# Patient Record
Sex: Female | Born: 1965 | Race: White | Hispanic: No | Marital: Single | Smoking: Former smoker
Health system: Southern US, Community
[De-identification: ages and names within clinical notes are randomized; demographics above are authoritative.]

## PROBLEM LIST (undated history)

## (undated) DIAGNOSIS — I2510 Atherosclerotic heart disease of native coronary artery without angina pectoris: Secondary | ICD-10-CM

## (undated) DIAGNOSIS — I10 Essential (primary) hypertension: Secondary | ICD-10-CM

## (undated) DIAGNOSIS — M329 Systemic lupus erythematosus, unspecified: Secondary | ICD-10-CM

## (undated) DIAGNOSIS — E119 Type 2 diabetes mellitus without complications: Secondary | ICD-10-CM

## (undated) HISTORY — PX: VALVE REPLACEMENT: SUR13

## (undated) HISTORY — PX: APPENDECTOMY: SHX54

## (undated) HISTORY — PX: KNEE ARTHROSCOPY: SUR90

## (undated) HISTORY — PX: TONSILLECTOMY: SUR1361

---

## 2004-05-27 ENCOUNTER — Emergency Department: Payer: Self-pay | Admitting: Internal Medicine

## 2005-06-22 ENCOUNTER — Ambulatory Visit: Payer: Self-pay | Admitting: Obstetrics and Gynecology

## 2005-07-14 ENCOUNTER — Ambulatory Visit: Payer: Self-pay | Admitting: Pain Medicine

## 2005-07-20 ENCOUNTER — Ambulatory Visit: Payer: Self-pay | Admitting: Pain Medicine

## 2005-09-20 ENCOUNTER — Ambulatory Visit: Payer: Self-pay | Admitting: Pain Medicine

## 2005-09-26 ENCOUNTER — Ambulatory Visit: Payer: Self-pay | Admitting: Pain Medicine

## 2005-09-27 ENCOUNTER — Emergency Department: Payer: Self-pay | Admitting: Internal Medicine

## 2005-12-21 ENCOUNTER — Inpatient Hospital Stay: Payer: Self-pay | Admitting: Internal Medicine

## 2005-12-29 ENCOUNTER — Emergency Department: Payer: Self-pay | Admitting: Emergency Medicine

## 2005-12-29 ENCOUNTER — Other Ambulatory Visit: Payer: Self-pay

## 2006-03-20 ENCOUNTER — Ambulatory Visit: Payer: Self-pay | Admitting: Unknown Physician Specialty

## 2006-04-28 ENCOUNTER — Ambulatory Visit: Payer: Self-pay | Admitting: Unknown Physician Specialty

## 2006-12-24 ENCOUNTER — Emergency Department: Payer: Self-pay | Admitting: Emergency Medicine

## 2007-04-01 ENCOUNTER — Other Ambulatory Visit: Payer: Self-pay

## 2007-04-01 ENCOUNTER — Emergency Department: Payer: Self-pay | Admitting: Emergency Medicine

## 2007-04-30 ENCOUNTER — Emergency Department: Payer: Self-pay | Admitting: Emergency Medicine

## 2007-04-30 ENCOUNTER — Other Ambulatory Visit: Payer: Self-pay

## 2008-05-26 ENCOUNTER — Emergency Department: Payer: Self-pay | Admitting: Emergency Medicine

## 2008-08-22 ENCOUNTER — Emergency Department: Payer: Self-pay | Admitting: Emergency Medicine

## 2011-11-02 DIAGNOSIS — G8929 Other chronic pain: Secondary | ICD-10-CM | POA: Insufficient documentation

## 2011-11-02 DIAGNOSIS — I2510 Atherosclerotic heart disease of native coronary artery without angina pectoris: Secondary | ICD-10-CM | POA: Insufficient documentation

## 2012-03-08 ENCOUNTER — Emergency Department: Payer: Self-pay | Admitting: Emergency Medicine

## 2012-03-08 LAB — CBC
HCT: 34.5 % — ABNORMAL LOW (ref 35.0–47.0)
HGB: 10.6 g/dL — ABNORMAL LOW (ref 12.0–16.0)
MCH: 21.8 pg — ABNORMAL LOW (ref 26.0–34.0)
MCHC: 30.8 g/dL — ABNORMAL LOW (ref 32.0–36.0)
MCV: 71 fL — ABNORMAL LOW (ref 80–100)
Platelet: 275 10*3/uL (ref 150–440)
RBC: 4.88 10*6/uL (ref 3.80–5.20)
RDW: 17 % — ABNORMAL HIGH (ref 11.5–14.5)
WBC: 13 10*3/uL — ABNORMAL HIGH (ref 3.6–11.0)

## 2012-03-08 LAB — COMPREHENSIVE METABOLIC PANEL
Albumin: 3.4 g/dL (ref 3.4–5.0)
Alkaline Phosphatase: 91 U/L (ref 50–136)
BUN: 11 mg/dL (ref 7–18)
Bilirubin,Total: 0.3 mg/dL (ref 0.2–1.0)
Chloride: 110 mmol/L — ABNORMAL HIGH (ref 98–107)
Co2: 26 mmol/L (ref 21–32)
SGOT(AST): 21 U/L (ref 15–37)
SGPT (ALT): 23 U/L (ref 12–78)
Total Protein: 7.3 g/dL (ref 6.4–8.2)

## 2012-08-22 ENCOUNTER — Emergency Department: Payer: Self-pay

## 2012-08-22 LAB — BASIC METABOLIC PANEL
Anion Gap: 7 (ref 7–16)
Chloride: 106 mmol/L (ref 98–107)
Co2: 24 mmol/L (ref 21–32)
Creatinine: 0.73 mg/dL (ref 0.60–1.30)
EGFR (African American): >60
EGFR (Non-African Amer.): >60
Osmolality: 277 (ref 275–301)
Potassium: 4.3 mmol/L (ref 3.5–5.1)
Sodium: 137 mmol/L (ref 136–145)

## 2012-08-22 LAB — CBC
MCH: 22.7 pg — ABNORMAL LOW (ref 26.0–34.0)
MCV: 72 fL — ABNORMAL LOW (ref 80–100)
Platelet: 272 10*3/uL (ref 150–440)
RDW: 22.1 % — ABNORMAL HIGH (ref 11.5–14.5)
WBC: 13.3 10*3/uL — ABNORMAL HIGH (ref 3.6–11.0)

## 2012-08-22 LAB — TROPONIN I
Troponin-I: <0.02 ng/mL
Troponin-I: <0.02 ng/mL

## 2012-08-23 LAB — HCG, QUANTITATIVE, PREGNANCY: Beta Hcg, Quant.: <1 m[IU]/mL — ABNORMAL LOW

## 2012-10-06 ENCOUNTER — Emergency Department: Payer: Self-pay | Admitting: Emergency Medicine

## 2014-01-22 ENCOUNTER — Emergency Department: Payer: Self-pay | Admitting: Emergency Medicine

## 2014-01-28 ENCOUNTER — Emergency Department: Payer: Self-pay | Admitting: Emergency Medicine

## 2014-01-31 ENCOUNTER — Emergency Department: Payer: Self-pay | Admitting: Emergency Medicine

## 2014-06-25 ENCOUNTER — Emergency Department
Admit: 2014-06-25 | Disposition: A | Discharge status: Home / Self Care | Payer: Self-pay | Admitting: Emergency Medicine

## 2014-07-18 NOTE — H&P (Signed)
PATIENT NAME:  [name], [name] MR#:  [id number] DATE OF BIRTH:  01-09-1966  DATE OF ADMISSION:  08/22/2012  PRIMARY CARE PHYSICIAN:  Dr. Cay SchillingsJack Wolf.   REFERRING PHYSICIAN:  Dr. Maurilio LovelyNoelle McLaurin.   CHIEF COMPLAINT:  Chest pain since yesterday.   HISTORY OF PRESENT ILLNESS:  [name] is a 49 year old Caucasian female with history of systemic hypertension, diabetes mellitus and obesity.  She reports the development of chest pain, started 24 hours ago, located across the chest from the right to the left, radiating to the neck, the back of the shoulder and down to the left arm.  The pain described as tightness and bad ache-like feeling.  The severity was 7 on a scale of 10.  This had persisted, then she decided today to come to the Emergency Department.  There is no significant shortness of breath.  No vomiting.  She indicates that two months ago she had similar pain and her primary care physician referred her to see a [facility].  She did not have a stress test, but she ended by having addition of nitrite or isosorbide.  She was fine until yesterday when the pain recurred.  She also indicated that she had cardiac work[place] in [place] two years ago before she moved to this area.  At that time, she had stress test, then she ended by having cardiac catheterization which showed single-vessel disease, but the severity of the disease was mild and did not require intervention and this is according to the patient.   REVIEW OF SYSTEMS:  CONSTITUTIONAL:  Denies any fever.  No chills.  No fatigue.  EYES:  No blurring of vision.  No double vision.  EARS, NOSE, THROAT:  No hearing impairment.  No sore throat.  No dysphagia.  CARDIOVASCULAR:  Reports the chest pain as above.  No significant shortness of breath.  No edema.  No syncope.  No palpitations.  RESPIRATORY:  No cough.  No sputum production.  No hemoptysis.  GASTROINTESTINAL:  No abdominal pain.  No vomiting.  No diarrhea.   GENITOURINARY:  No dysuria.  No frequency of urination.  Her last menstrual period was two months ago, usually it is very irregular, sometimes takes six months or a year intervals.  MUSCULOSKELETAL:  No joint pain or swelling.  No muscular pain or swelling.  INTEGUMENTARY:  No skin rash.  No ulcers.  NEUROLOGY:  No focal weakness.  No seizure activity.  No headache.  PSYCHIATRY:  She has some anxiety.  No depression.  ENDOCRINE:  No polyuria or polydipsia.  No heat or cold intolerance.   PAST MEDICAL HISTORY:  History of chest pain evaluated two years ago with cardiac cath showing single vessel disease with minimal findings that did not require intervention.  Also the patient reports two months ago she saw a cardiologist, but no significant cardiac work-up.  Her other medical problems include systemic hypertension, diabetes mellitus type 2, chronic smoking, obesity, anxiety and depression.   PAST SURGICAL HISTORY:  Cholecystectomy, appendectomy, C-section, tonsillectomy, ovarian surgery for a cyst and knee surgery.   FAMILY HISTORY:  Her father died from complications of end-stage renal disease and he was on dialysis.  He had coronary artery disease and diabetes.  Her mother is still living.  She suffers from hypercholesterolemia or hyperlipidemia.   SOCIAL HABITS:  Chronic smoker.  She started smoking at age 49.  She is trying to cut down.  Now, she is smoking five cigarettes a day.  No history of  alcohol or drug abuse.   SOCIAL HISTORY:  She is single.  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   ADMISSION MEDICATIONS:  Metoprolol 100 mg twice a day, metformin 1000 mg twice a day, lisinopril 10 mg a day, isosorbide dinitrate 30 mg q. 6 hours, Nexium 40 mg twice a day, Prozac 40 mg once a day, Xanax 0.5 mg 3 times a day.   ALLERGIES:  MORPHINE CAUSING CHEST TIGHTNESS.  TYLOX  CAUSING NAUSEA AND VOMITING.  DEMEROL CAUSING HEADACHES.  VOLTAREN CAUSING SKIN RASH.  SKELAXIN, ULTRAM AND CATAFLAM CAUSING SKIN RASH, SO ALSO WITH PERCOCET CAUSING ITCHING AND CELEBREX AS WELL.   PHYSICAL EXAMINATION: VITAL SIGNS:  Blood pressure 160/85, respiratory rate 20, pulse 73, temperature 98, oxygen saturation 98%.  GENERAL APPEARANCE:  Young female, morbidly obese, sitting on the stretcher in no acute distress.  HEAD AND NECK:  No pallor.  No icterus.  No cyanosis.  Ear examination revealed normal hearing, no discharge, no ulcers.  Examination of the nose showed no discharge, no bleeding, no ulcers.  Oropharyngeal examination revealed no exudate.  No oral thrush.  She has mild tenderness over the left maxillary area.  This is chronic for her for a while and she thinks she may have a tooth abscess and she is looking for a dentist.  Eye examination revealed normal eyelids and conjunctivae.  Pupils about 4 to 5 mm, round, equal and sluggishly reactive to light.  NECK:  Supple.  Trachea at midline.  No thyromegaly.  No cervical lymphadenopathy.  No masses.  HEART:  Normal S1, S2.  No S3 or S4.  There is a grade 2 over 6 mid systolic murmur at the aortic area and left sternal border radiating to the neck.  The patient indicates that she has a history of murmur before.  RESPIRATORY:  Revealed normal breathing pattern without use of accessory muscles.  No rales.  No wheezing.  ABDOMEN:  Soft without tenderness.  No hepatosplenomegaly.  No masses.  No hernias.  SKIN:  Revealed no ulcers.  No subcutaneous nodules.  MUSCULOSKELETAL:  No joint swelling.  No clubbing.  NEUROLOGIC:  Cranial nerves II through XII are intact.  No focal motor deficit.  PSYCHIATRIC:  The patient is alert, oriented x 3.  Mood and affect were normal.   LABORATORY FINDINGS:  Her EKG showed normal sinus rhythm at rate of 97 per minute.  Nonspecific ST-T wave abnormalities in the lateral leads, more so consistent with left  ventricular hypertrophy with repolarization abnormality.  There is subtle change compared to her EKG in December of 2013.  At that time her T waves were upright in V5, V6, however this could be secondary to lead misplacement as similar changes of ST-T wave changes were present at that time on leads 1 and aVL.  Chest x-ray showed no acute cardiopulmonary abnormalities.  Blood work-up showed glucose of 158, BUN 12, creatinine 0.7, sodium 137, potassium 4.3.  Troponin less than 0.02 in two samples.  CBC showed a white count of 13,000, hemoglobin 13, hematocrit 41, platelet count 272.   ASSESSMENT: 1.  Chest pain.  The description of her chest pain is suspicious for angina.  2.  Systemic hypertension.  3.  Diabetes mellitus, type 2.  4.  Chronic smoker. 5.  Obesity.   PLAN:  We will admit the patient  to the medical floor on telemetry monitoring as observation.  Follow up on cardiac enzymes.  Obtain nuclear stress test.  Cardiology consultation.  Add aspirin and continue beta-blocker and nitrite.  Oxygen supplementation.  Accu-Chek and sliding scale.  The patient was advised to quit smoking.  I offered nicotine patch, but she did not feel necessary.  For deep vein thrombosis prophylaxis, I placed her on heparin subcutaneously.  I will check urine for hCG or pregnancy test since her last menstrual period is two months ago, however this is not unusual for her, sometimes six months or a year pass between menstrual periods.   Time spent in evaluating this patient took more than 1 hour.     ____________________________ Carney Corners. Rudene Re, MD amd:ea D: 08/23/2012 00:29:00 ET T: 08/23/2012 01:24:32 ET JOB#: [id number]  cc: Carney Corners. Rudene Re, MD, <Dictator> Zollie Scale MD ELECTRONICALLY SIGNED 08/23/2012 6:46

## 2014-07-18 NOTE — Discharge Summary (Signed)
PATIENT NAME:  [name], [name] MR#:  [id number] DATE OF BIRTH:  Dec 30, 1965  DATE OF ADMISSION:  08/22/2012 DATE OF DISCHARGE:  08/23/2012  For a detailed note, please take a look at the history and physical done on admission by Dr. Rudene Rearwish.   DISCHARGE DIAGNOSES:  1.  Chest pain, atypical and likely musculoskeletal in nature. 2.  Morbid obesity. 3.  Diabetes. 4.  Hypertension. 5.  Hyperlipidemia.  6.  Gastroesophageal reflux disease. 7.  Anxiety.   DIET: The patient was discharged on a low-sodium, low-fat, American Diabetic Association diet.   ACTIVITY: As tolerated.   FOLLOWUP: With Dr. Adrian BlackwaterShaukat Khan and Dr. Cay SchillingsJack Wolf in the next 1 to 2 weeks.  DISCHARGE MEDICATIONS: Nexium 40 mg b.i.d., Xanax 0.5 mg t.i.d., metoprolol 100 mg b.i.d., lisinopril 10 mg daily, Prozac 40 mg daily, isosorbide dinitrate 30 mg daily and metformin 1000 mg b.i.d.   CONSULTANTS DURING THE HOSPITAL COURSE: Dr. Adrian BlackwaterShaukat Khan from cardiology.  PERTINENT STUDIES DONE DURING THE HOSPITAL COURSE: Are as follows: A chest x-ray done on admission showing no acute cardiopulmonary disease. A nuclear medicine Lexiscan done 08/23/2012 showing no evidence of any acute myocardial ischemia.   BRIEF HOSPITAL COURSE: This is a 49 year old female, who presented to the hospital with chest pain/pressure.   1.  Chest pain/pressure. The patient was admitted to the hospital under observation given her significant risk factors of coronary artery disease, given her morbid obesity, type 2 diabetes and previous history of tobacco abuse. She was observed on telemetry and had no evidence of any acute arrhythmias. Her cardiac markers x 3 were negative. Her chest x-ray also on admission was essentially normal. She underwent a Lexiscan Myoview on the day after admission, which showed no evidence of any irreversible myocardial ischemia. The patient's chest pain was thought to be musculoskeletal in nature. She was discharged home with close follow-up  with her primary care physician and cardiology as an outpatient. The patient does have a previous history of coronary disease and had a catheterization done in [place] about a few years back and cardiologist, Dr. Welton FlakesKhan was going to try to obtain records and follow up with her as an outpatient as her acute workup in the hospital was negative.  2.  Diabetes. The patient's metformin was held and she was advised to hold her metformin for at least another 24 hours prior to resuming it given the fact that she had an stress test done with contrast. The patient's blood sugars remained stable.  3.  Hypertension: The patient remained hemodynamically stable. She will continue her lisinopril, metoprolol and Isordil. 4.  Anxiety. The patient was maintained on her Xanax. She will resume that. 5.  GERD: The patient was maintained on Nexium and she will also resume that upon discharge.   CODE STATUS: The patient is a full code.   TIME SPENT ON DISCHARGE: 35 minutes.   ____________________________ Rolly PancakeVivek J. Cherlynn KaiserSainani, MD vjs:aw D: 08/24/2012 08:29:53 ET T: 08/24/2012 10:50:37 ET JOB#: [id number]  cc: Rolly PancakeVivek J. Cherlynn KaiserSainani, MD, <Dictator> Laurier NancyShaukat A. Khan, MD Mickie HillierJack H. Sheppard PentonWolf, MD Houston SirenVIVEK J Janaisha Tolsma MD ELECTRONICALLY SIGNED 09/02/2012 20:32

## 2014-07-18 NOTE — Consult Note (Signed)
PATIENT NAME:  [name], [name] MR#:  [id number] DATE OF BIRTH:  1965/07/20  DATE OF CONSULTATION:  08/23/2012  REFERRING PHYSICIAN:   CONSULTING PHYSICIAN:  Verta EllenMonica A. Rilyn Scroggs, PA-C  PRIMARY CARE PHYSICIAN: Wendy SchillingsJack Wolf, MD  REASON FOR CONSULTATION: Chest pain.   HISTORY OF PRESENT ILLNESS: [name] is a 49 year old white female with a past medical history significant for hypertension, diabetes mellitus and obesity. The patient notes that she has had chest pain that began 2 days ago that was across her entire chest. It with aching in the left shoulder, left arm and between her shoulder blades.  She had associated headache and has dyspnea on exertion. The patient also has orthopnea, and uses 3 pillows to sleep and was told she may have some mild sleep apnea. She has occasional palpitations that are worse with stress, occasional intermittent dizziness, but denies any syncopal episodes. She also has some trace intermittent pedal edema.   The patient was also worried yesterday as she did have some pain in her neck and jaw last evening, but is unknown whether it is secondary to tooth pain as she believes she has a tooth abscess and she has multiple broken teeth. She is concerned that she has taken multiple weight loss meds including Fen/Phen and others and is wondering if that may have affected her heart. She notes that she had previous episodes of chest pain when she lived in [place], less than 2 years ago, and had echocardiogram, stress test and cardiac catheterization at that time. [facility] was [facility], in [place], and she was told she had a very small blockage in an artery that was too small to stent.   PAST MEDICAL HISTORY: 1.  Bulimia.  2.  Hypertension.  3.  Diabetes mellitus type 2.  4.  Tobacco abuse.  5.  Obesity.  6.  Anxiety and depression.  7.  Gastroesophageal reflux disease.  8.  Mild sleep apnea.   PAST SURGICAL HISTORY: 1.   Cholecystectomy.  2.  Appendectomy. 3.  C-section.  4.  Tonsillectomy. 5.  Ovarian cyst requiring surgery.  6.  Knee surgery.   ALLERGIES: MORPHINE (CHEST TIGHTNESS), TYLOX (NAUSEA AND VOMITING), DEMEROL (HEADACHES,), VOLTAREN (SKIN RASH), SKELAXIN, ULTRAM AND CATAFLAM (SKIN RASH), PERCOCET (PRURITUS), CELEBREX (PRURITUS), NEURONTIN (HIVES).   HOME MEDICATIONS: 1.  Metoprolol 100 mg p.o. b.i.d.  2.  Metformin 500 mg p.o. b.i.d.  3.  Lisinopril 10 mg p.o. daily.  4.  Isosorbide 30 mg daily.  5.  Nexium 40 mg p.o. b.i.d.  6.  Prozac 40 mg p.o. daily.  7.  Xanax 0.5 mg at bedtime.   SOCIAL HISTORY: The patient is a chronic smoker and started smoking at age 49, she is trying to quit and down to 5 cigarettes per day. She denies any alcohol or illicit drug use.   FAMILY HISTORY: Father had hypertension, coronary artery disease, diabetes, endstage renal disease (on dialysis), mother with hyperlipidemia, paternal uncle, paternal grandmother and paternal aunts all with coronary artery disease.   REVIEW OF SYSTEMS: GENERAL: The patient with mild fatigue. Denies chills, fever.  EYES: The patient denies any blurred vision, wears corrective lenses. EARS, NOSE AND THROAT:  The patient denies any tinnitus, hearing impairment. The patient complains of tooth pain and mouth pain. PULMONARY: The patient has intermittent shortness of breath.  CARDIOVASCULAR: Chest pain as mentioned in the HPI. GASTROINTESTINAL: The patient denies any nausea, vomiting, abdominal pain.  MUSCULOSKELETAL: The patient has intermittent pedal edema.  PHYSICAL EXAMINATION: GENERAL: This is a pleasant, overweight female who is not in any acute distress. She is alert and oriented x 3.  VITAL SIGNS: At this time are stable with a temperature of 98 degrees Fahrenheit, heart rate 75, respiratory rate 14, blood pressure 135/65 and O2 sats 98% on room air.  HEENT: Head:  Atraumatic, normocephalic. Ears:  The patient wears glasses.  Pupils are round and equal. Conjunctivae are pale, pink. Ears and nose:  Are normal to external inspection.  Mouth:  Poor dentition. Moist mucous membranes.  NECK: Supple.  Trachea is midline. No carotid bruits can be appreciated.  LUNGS:  The patient has scattered wheezing appreciated anteriorly and posteriorly. No accessory muscle use.  HEART:  Regular rate and rhythm with a murmur noted loudest over the apex.  ABDOMEN: Obese. Bowel sounds present in all 4 quadrants. It is soft and nontender to palpation.  EXTREMITIES: Some trace pedal edema.   ANCILLARY DATA: EKG on admission with normal sinus rhythm, LVH, possible repolarization abnormalities.   LABORATORY DATA: Troponin I's were negative x 3. Glucose 158, BUN 12, creatinine 0.73, sodium 137, potassium 4.3, chloride 106.  Estimated GFR greater than 60.   White blood cell count 13.3, hemoglobin 13.1, hematocrit 41.5, platelet count 272,000.  ASSESSMENT AND PLAN: 1.  Chest pain. The patient complains of chest pain radiating to the jaw and left arm. Troponin I's have been negative x 3 so there is no evidence of an acute myocardial infarction at this time. She does state that she does have  a history of coronary artery disease and had a catheterization less than 2 years ago along with echocardiogram and stress testing in [place]. The patient knows she has a very small blockage in 1 minor branch of an artery that was too small for PCI/intervention. The patient had been on Imdur 30 mg once per day at home. Stress test has been ordered to further evaluate and we also need to get records from work-up in [place]. Since troponin I's have been negative and she is not having any chest pain at this time, she can be discharged home with follow-up in our office.  2.  Hypertension.  Well controlled at this time.  3.  Diabetes mellitus.  Sugar seemed to be controlled in the ER visit today.  4.  Tobacco abuse. The patient instructed that she should try  to quit smoking to reduce her risk factors.  5.  Obesity. The patient instructed to try to exercise upon cardiac clearance and lose weight.   Thank you very much for this consultation and allowing [place] to participate in this patient's care. ____________________________ Verta Ellen, PA-C mam:sb D: 08/23/2012 13:00:27 ET T: 08/23/2012 13:28:24 ET JOB#: [id number]  cc: Verta Ellen, PA-C, <Dictator> Mickie Hillier. Sheppard Penton, MD Kelsea Mousel [facility] ELECTRONICALLY SIGNED 08/31/2012 11:49

## 2014-08-30 ENCOUNTER — Emergency Department: Payer: Worker's Compensation

## 2014-08-30 ENCOUNTER — Emergency Department
Admission: EM | Admit: 2014-08-30 | Discharge: 2014-08-30 | Disposition: A | Discharge status: Home / Self Care | Payer: Worker's Compensation | Attending: Emergency Medicine | Admitting: Emergency Medicine

## 2014-08-30 DIAGNOSIS — W1849XA Other slipping, tripping and stumbling without falling, initial encounter: Secondary | ICD-10-CM | POA: Insufficient documentation

## 2014-08-30 DIAGNOSIS — W1840XA Slipping, tripping and stumbling without falling, unspecified, initial encounter: Secondary | ICD-10-CM

## 2014-08-30 DIAGNOSIS — Z720 Tobacco use: Secondary | ICD-10-CM | POA: Insufficient documentation

## 2014-08-30 DIAGNOSIS — S5012XA Contusion of left forearm, initial encounter: Secondary | ICD-10-CM | POA: Insufficient documentation

## 2014-08-30 DIAGNOSIS — Y9389 Activity, other specified: Secondary | ICD-10-CM | POA: Insufficient documentation

## 2014-08-30 DIAGNOSIS — S86911A Strain of unspecified muscle(s) and tendon(s) at lower leg level, right leg, initial encounter: Secondary | ICD-10-CM | POA: Diagnosis not present

## 2014-08-30 DIAGNOSIS — Y9289 Other specified places as the place of occurrence of the external cause: Secondary | ICD-10-CM | POA: Diagnosis not present

## 2014-08-30 DIAGNOSIS — S0993XA Unspecified injury of face, initial encounter: Secondary | ICD-10-CM | POA: Insufficient documentation

## 2014-08-30 DIAGNOSIS — Y998 Other external cause status: Secondary | ICD-10-CM | POA: Diagnosis not present

## 2014-08-30 DIAGNOSIS — E119 Type 2 diabetes mellitus without complications: Secondary | ICD-10-CM | POA: Diagnosis not present

## 2014-08-30 DIAGNOSIS — I10 Essential (primary) hypertension: Secondary | ICD-10-CM | POA: Insufficient documentation

## 2014-08-30 DIAGNOSIS — S8991XA Unspecified injury of right lower leg, initial encounter: Secondary | ICD-10-CM | POA: Diagnosis present

## 2014-08-30 HISTORY — DX: Type 2 diabetes mellitus without complications: E11.9

## 2014-08-30 HISTORY — DX: Essential (primary) hypertension: I10

## 2014-08-30 MED ORDER — TRAMADOL HCL 50 MG PO TABS: 50.0000 mg | ORAL_TABLET | Freq: Two times a day (BID) | ORAL | Status: DC

## 2014-08-30 MED ORDER — KETOROLAC TROMETHAMINE 10 MG PO TABS
ORAL_TABLET | ORAL | Status: AC
  Administered 2014-08-30: 10 mg via ORAL
  Filled 2014-08-30: qty 1

## 2014-08-30 MED ORDER — ORPHENADRINE CITRATE ER 100 MG PO TB12: 100.0000 mg | ORAL_TABLET | Freq: Two times a day (BID) | ORAL | Status: DC

## 2014-08-30 MED ORDER — KETOROLAC TROMETHAMINE 10 MG PO TABS
10.0000 mg | ORAL_TABLET | Freq: Once | ORAL | Status: AC
  Administered 2014-08-30: 10 mg via ORAL

## 2014-08-30 MED ORDER — KETOROLAC TROMETHAMINE 10 MG PO TABS: 10.0000 mg | ORAL_TABLET | Freq: Three times a day (TID) | ORAL | Status: DC

## 2014-08-30 NOTE — ED Provider Notes (Signed)
[facility] Emergency Department Provider Note ____________________________________________  Time seen: 1800  I have reviewed the triage vital signs and the nursing notes.  HISTORY  Chief Complaint Fall  HPI [name] is a 49 y.o. female who is well-known to the ED, who reports an injury that occurred at work yesterday. She describes she had moved the mop bucket, when she inadvertently stepped and slipped on the greasy floor. This caused her to fall forward and the door frame, hitting her left arm, and right knee, and right side of her face. She denies loss of consciousness, dental injury, laceration, or abrasion. She continued to work and finished her shift yesterday, as well as today but reports now for evaluation and management of her symptoms. Her primary complaint is pain to the medial right knee and difficulty walking. She claims to have dosed 2 Tylenol tablets at 9:00 this morning for symptom relief and nothing since that time. She is here for evaluation and management of her symptoms.  Past Medical History  Diagnosis Date  . Hypertension   . Diabetes mellitus without complication    There are no active problems to display for this patient.  No past surgical history on file.  Current Outpatient Rx  Name  Route  Sig  Dispense  Refill  . ketorolac (TORADOL) 10 MG tablet   Oral   Take 1 tablet (10 mg total) by mouth every 8 (eight) hours.   15 tablet   0   . orphenadrine (NORFLEX) 100 MG tablet   Oral   Take 1 tablet (100 mg total) by mouth 2 (two) times daily.   20 tablet   0   . traMADol (ULTRAM) 50 MG tablet   Oral   Take 1 tablet (50 mg total) by mouth 2 (two) times daily.   10 tablet   0     Allergies Flexeril; Celecoxib; Gabapentin; Morphine and related; Percocet; Skelaxin; Demerol; and Tramadol  No family history on file.  Social History History  Substance Use Topics  . Smoking status: Current Every Day Smoker -- 0.50  packs/day for 15 years    Types: Cigarettes  . Smokeless tobacco: Never Used  . Alcohol Use: No   Review of Systems  Constitutional: Negative for fever. Eyes: Negative for visual changes. ENT: Negative for sore throat. Cardiovascular: Negative for chest pain. Respiratory: Negative for shortness of breath. Gastrointestinal: Negative for abdominal pain, vomiting and diarrhea. Genitourinary: Negative for dysuria. Musculoskeletal: Negative for back pain. Positive for LUE and right knee pain as above.  Skin: Negative for rash. Neurological: Negative for headaches, focal weakness or numbness. ____________________________________________  PHYSICAL EXAM:  VITAL SIGNS: ED Triage Vitals  Enc Vitals Group     BP 08/30/14 1606 155/61 mmHg     Pulse Rate 08/30/14 1606 74     Resp 08/30/14 1606 18     Temp 08/30/14 1606 98.2 F (36.8 C)     Temp Source 08/30/14 1606 Oral     SpO2 08/30/14 1606 98 %     Weight 08/30/14 1606 219 lb 1.6 oz (99.383 kg)     Height 08/30/14 1606 4' 11.75" (1.518 m)     Head Cir --      Peak Flow --      Pain Score 08/30/14 1619 5     Pain Loc --      Pain Edu? --      Excl. in GC? --    Constitutional: Alert and oriented. Well appearing  and in no distress. Eyes: Conjunctivae are normal. PERRL. Normal extraocular movements. ENT   Head: Normocephalic and atraumatic. No abrasion or swelling noted.    Nose: No congestion/rhinnorhea.   Mouth/Throat: Mucous membranes are moist.   Neck: Supple.  Hematological/Lymphatic/Immunilogical: No cervical lymphadenopathy. Cardiovascular: Normal rate, regular rhythm.  Respiratory: Normal respiratory effort.No wheezes/rales/rhonchi. Gastrointestinal: Soft and nontender. No distention. Musculoskeletal: Nontender with normal range of motion in all extremities. Right knee with medial joint tenderness. Normal, full knee ROM. Negative valgus/varus joint stress. LUE with normal gross appearance without swelling,  edema, or erythema. Normal grip strength noted.  Neurologic:  Normal speech and language. No gross focal neurologic deficits are appreciated. Skin:  Skin is warm, dry and intact. No rash noted. Psychiatric: Mood and affect are normal. Patient exhibits appropriate insight and judgment. __________________________   RADIOLOGY Right Knee IMPRESSION: 1. No evidence of acute fracture or dislocation. 2. Mild tricompartmental osteoarthritis noted, with likely loose body at the posterior aspect of the joint space. ____________________________________________  PROCEDURES  Toradol 10 mg PO ____________________________________________  INITIAL IMPRESSION / ASSESSMENT AND PLAN / ED COURSE Radiology results to patient. Mechanical fall at work with minor injury to the face, left forearm and right knee. Facial contusion, left UE contusion, and right knee contusion and strain.  Patient discharged home with Toradol and Norflex for pain relief. Work note for tomorrow if needed.  ____________________________________________  FINAL CLINICAL IMPRESSION(S) / ED DIAGNOSES  Final diagnoses:  Slipping, tripping and stumbling w/o falling, unsp, init  Knee strain, right, initial encounter  Forearm contusion, left, initial encounter     Lissa Hoard, PA-C 08/30/14 1904  Sharyn Creamer, MD 08/30/14 2152

## 2014-08-30 NOTE — ED Notes (Signed)
Patient was working at [employer] yesterday and was attempting to turn down air conditioner when she reports slipping on greasy floor.  Patient comes in today with complaints of pain to right knee.  Right knee does not appear to be swollen and is not bruised.  Patient also complains of pain to left forearm which is not bruised or swollen, however patient reports weakness to left lower arm.  Patient also complains of stiffness to neck and bilateral shoulders. Patient has full range of motion to neck with no bruising.

## 2014-08-30 NOTE — Discharge Instructions (Signed)
Contusion A contusion is a deep bruise. Contusions happen when an injury causes bleeding under the skin. Signs of bruising include pain, puffiness (swelling), and discolored skin. The contusion may turn blue, purple, or yellow. HOME CARE   Put ice on the injured area.  Put ice in a plastic bag.  Place a towel between your skin and the bag.  Leave the ice on for 15-20 minutes, 03-04 times a day.  Only take medicine as told by your doctor.  Rest the injured area.  If possible, raise (elevate) the injured area to lessen puffiness. GET HELP RIGHT AWAY IF:   You have more bruising or puffiness.  You have pain that is getting worse.  Your puffiness or pain is not helped by medicine. MAKE SURE YOU:   Understand these instructions.  Will watch your condition.  Will get help right away if you are not doing well or get worse. Document Released: 08/31/2007 Document Revised: 06/06/2011 Document Reviewed: 01/17/2011 [facility] Patient Information 2015 [place], [place]. This information is not intended to replace advice given to you by your health care provider. Make sure you discuss any questions you have with your health care provider.  Take the prescription meds as directed.  Apply ice to any sore muscles and the knee. Follow-up with Dr. Greggory StallionGeorge for ongoing symptoms.

## 2015-01-06 ENCOUNTER — Ambulatory Visit
Admission: EM | Admit: 2015-01-06 | Discharge: 2015-01-06 | Disposition: A | Discharge status: Home / Self Care | Payer: Managed Care, Other (non HMO) | Attending: Family Medicine | Admitting: Family Medicine

## 2015-01-06 ENCOUNTER — Encounter: Payer: Self-pay | Admitting: Emergency Medicine

## 2015-01-06 DIAGNOSIS — S29012A Strain of muscle and tendon of back wall of thorax, initial encounter: Secondary | ICD-10-CM

## 2015-01-06 DIAGNOSIS — M25512 Pain in left shoulder: Secondary | ICD-10-CM

## 2015-01-06 MED ORDER — METHOCARBAMOL 500 MG PO TABS: 500.0000 mg | ORAL_TABLET | Freq: Two times a day (BID) | ORAL | Status: AC

## 2015-01-06 MED ORDER — PREDNISONE 20 MG PO TABS: 20.0000 mg | ORAL_TABLET | Freq: Every day | ORAL | Status: DC

## 2015-01-06 NOTE — ED Notes (Signed)
Patient states she has been having "shoulder and elbow pain since about 4 days ago" patient doesn't remember any injury but states pain is getting to the point where she can't use her left arm

## 2015-01-06 NOTE — ED Provider Notes (Signed)
CSN: [id number]     Arrival date & time 01/06/15  0865 History   First MD Initiated Contact with Patient 01/06/15 [phone]     Chief Complaint  Patient presents with  . Shoulder Pain   (Consider location/radiation/quality/duration/timing/severity/associated sxs/prior Treatment) HPI Comments: 49 yo female with a 4 days h/o left shoulder pain radiating down to the elbow and up towards to left upper back, neck area. Pain is worse with movement of the arm. Denies any injuries, falls, heavy lifting, swelling, redness, rash, fevers, chills.   The history is provided by the patient.    Past Medical History  Diagnosis Date  . Hypertension   . Diabetes mellitus without complication [facility])    Past Surgical History  Procedure Laterality Date  . Cesarean section    . Appendectomy    . Knee arthroscopy    . Tonsillectomy     Family History  Problem Relation Age of Onset  . Hyperlipidemia Mother   . Hypertension Father   . Diabetes Father   . Hyperlipidemia Father    Social History  Substance Use Topics  . Smoking status: Former Smoker -- 0.50 packs/day for 15 years    Types: Cigarettes  . Smokeless tobacco: Never Used  . Alcohol Use: No   OB History    No data available     Review of Systems  Allergies  Flexeril; Celecoxib; Gabapentin; Morphine and related; Percocet; Skelaxin; Demerol; Tramadol; Ketorolac; and Norflex  Home Medications   Prior to Admission medications   Medication Sig Start Date End Date Taking? Authorizing Provider  FLUoxetine (PROZAC) 40 MG capsule Take 40 mg by mouth daily.   Yes Historical Provider, MD  lisinopril (PRINIVIL,ZESTRIL) 20 MG tablet Take 20 mg by mouth daily.   Yes Historical Provider, MD  metFORMIN (GLUCOPHAGE) 500 MG tablet Take by mouth 2 (two) times daily with a meal.   Yes Historical Provider, MD  metoprolol succinate (TOPROL-XL) 100 MG 24 hr tablet Take 100 mg by mouth daily. Take with or immediately following a meal.   Yes Historical  Provider, MD  ketorolac (TORADOL) 10 MG tablet Take 1 tablet (10 mg total) by mouth every 8 (eight) hours. 08/30/14   Jenise V Bacon Menshew, PA-C  methocarbamol (ROBAXIN) 500 MG tablet Take 1 tablet (500 mg total) by mouth 2 (two) times daily. 01/06/15   Payton Mccallum, MD  orphenadrine (NORFLEX) 100 MG tablet Take 1 tablet (100 mg total) by mouth 2 (two) times daily. 08/30/14   Jenise V Bacon Menshew, PA-C  predniSONE (DELTASONE) 20 MG tablet Take 1 tablet (20 mg total) by mouth daily. 01/06/15   Payton Mccallum, MD   Meds Ordered and Administered this Visit  Medications - No data to display  BP 151/73 mmHg  Pulse 60  Temp(Src) 97.1 F (36.2 C) (Tympanic)  Resp 18  Ht  (1.549 m)  Wt 216 lb (97.977 kg)  BMI 40.83 kg/m2  SpO2 100%  LMP 12/30/2014 No data found.   Physical Exam  Constitutional: She appears well-developed and well-nourished. No distress.  Cardiovascular: Intact distal pulses.   Musculoskeletal:       Left shoulder: She exhibits decreased range of motion (secondary to pain) and tenderness (over the deltoid muscle). She exhibits no bony tenderness, no swelling, no effusion, no crepitus, no deformity, no laceration, no spasm, normal pulse and normal strength.       Left elbow: She exhibits normal range of motion, no swelling, no effusion, no deformity and no laceration.  Tenderness found. Lateral epicondyle tenderness noted.  Moderate tenderness to palpation over the left trapezius muscle  Skin: No rash noted. She is not diaphoretic.  Nursing note and vitals reviewed.   ED Course  Procedures (including critical care time)  Labs Review Labs Reviewed - No data to display  Imaging Review No results found.   Visual Acuity Review  Right Eye Distance:   Left Eye Distance:   Bilateral Distance:    Right Eye Near:   Left Eye Near:    Bilateral Near:         MDM   1. Shoulder pain, left   2. Muscle strain of left upper back, initial encounter      Discharge Medication List as of 01/06/2015 10:12 AM    START taking these medications   Details  methocarbamol (ROBAXIN) 500 MG tablet Take 1 tablet (500 mg total) by mouth 2 (two) times daily., Starting 01/06/2015, Until Discontinued, Normal    predniSONE (DELTASONE) 20 MG tablet Take 1 tablet (20 mg total) by mouth daily., Starting 01/06/2015, Until Discontinued, Normal       1. Labs/x-ray results and diagnosis reviewed with patient/parent/guardian/family 2. rx as per orders above; reviewed possible side effects, interactions, risks and benefits; (note: patient with multiple allergies to pain medications and NSAIDS)  3. Recommend supportive treatment with heat, range of motion exercises, stretches 4. Follow prn if symptoms worsen or don't improve   Payton Mccallum, MD 01/06/15 1220

## 2015-02-20 ENCOUNTER — Encounter: Payer: Self-pay | Admitting: Emergency Medicine

## 2015-02-20 ENCOUNTER — Emergency Department
Admission: EM | Admit: 2015-02-20 | Discharge: 2015-02-20 | Disposition: A | Discharge status: Home / Self Care | Payer: Managed Care, Other (non HMO) | Attending: Emergency Medicine | Admitting: Emergency Medicine

## 2015-02-20 DIAGNOSIS — R509 Fever, unspecified: Secondary | ICD-10-CM | POA: Insufficient documentation

## 2015-02-20 DIAGNOSIS — Z791 Long term (current) use of non-steroidal anti-inflammatories (NSAID): Secondary | ICD-10-CM | POA: Diagnosis not present

## 2015-02-20 DIAGNOSIS — Z3202 Encounter for pregnancy test, result negative: Secondary | ICD-10-CM | POA: Diagnosis not present

## 2015-02-20 DIAGNOSIS — R112 Nausea with vomiting, unspecified: Secondary | ICD-10-CM | POA: Diagnosis present

## 2015-02-20 DIAGNOSIS — R197 Diarrhea, unspecified: Secondary | ICD-10-CM | POA: Diagnosis not present

## 2015-02-20 DIAGNOSIS — E119 Type 2 diabetes mellitus without complications: Secondary | ICD-10-CM | POA: Insufficient documentation

## 2015-02-20 DIAGNOSIS — F17210 Nicotine dependence, cigarettes, uncomplicated: Secondary | ICD-10-CM | POA: Diagnosis not present

## 2015-02-20 DIAGNOSIS — I10 Essential (primary) hypertension: Secondary | ICD-10-CM | POA: Insufficient documentation

## 2015-02-20 DIAGNOSIS — Z79899 Other long term (current) drug therapy: Secondary | ICD-10-CM | POA: Diagnosis not present

## 2015-02-20 DIAGNOSIS — Z7952 Long term (current) use of systemic steroids: Secondary | ICD-10-CM | POA: Insufficient documentation

## 2015-02-20 LAB — COMPREHENSIVE METABOLIC PANEL
ALT: 16 U/L (ref 14–54)
ANION GAP: 8 (ref 5–15)
AST: 21 U/L (ref 15–41)
Albumin: 3.7 g/dL (ref 3.5–5.0)
Alkaline Phosphatase: 51 U/L (ref 38–126)
BUN: 14 mg/dL (ref 6–20)
CHLORIDE: 108 mmol/L (ref 101–111)
CO2: 23 mmol/L (ref 22–32)
CREATININE: 0.65 mg/dL (ref 0.44–1.00)
Calcium: 9 mg/dL (ref 8.9–10.3)
GFR calc non Af Amer: >60 mL/min (ref >60)
Glucose, Bld: 87 mg/dL (ref 65–99)
Potassium: 3.5 mmol/L (ref 3.5–5.1)
Sodium: 139 mmol/L (ref 135–145)
Total Bilirubin: 0.5 mg/dL (ref 0.3–1.2)
Total Protein: 6.9 g/dL (ref 6.5–8.1)

## 2015-02-20 LAB — URINALYSIS COMPLETE WITH MICROSCOPIC (ARMC ONLY)
BILIRUBIN URINE: NEGATIVE
Bacteria, UA: NONE SEEN
Glucose, UA: NEGATIVE mg/dL
HGB URINE DIPSTICK: NEGATIVE
Ketones, ur: NEGATIVE mg/dL
Leukocytes, UA: NEGATIVE
Nitrite: NEGATIVE
PH: 5 (ref 5.0–8.0)
Protein, ur: 30 mg/dL — AB
Specific Gravity, Urine: 1.026 (ref 1.005–1.030)

## 2015-02-20 LAB — CBC
HCT: 37.9 % (ref 35.0–47.0)
HEMOGLOBIN: 11.9 g/dL — AB (ref 12.0–16.0)
MCH: 22.4 pg — AB (ref 26.0–34.0)
MCHC: 31.3 g/dL — ABNORMAL LOW (ref 32.0–36.0)
MCV: 71.6 fL — AB (ref 80.0–100.0)
Platelets: 227 10*3/uL (ref 150–440)
RBC: 5.29 MIL/uL — ABNORMAL HIGH (ref 3.80–5.20)
RDW: 16.5 % — ABNORMAL HIGH (ref 11.5–14.5)
WBC: 8.8 10*3/uL (ref 3.6–11.0)

## 2015-02-20 LAB — POCT PREGNANCY, URINE: Preg Test, Ur: NEGATIVE

## 2015-02-20 LAB — TROPONIN I: Troponin I: <0.03 ng/mL (ref <0.031)

## 2015-02-20 LAB — LIPASE, BLOOD: Lipase: 30 U/L (ref 11–51)

## 2015-02-20 MED ORDER — SODIUM CHLORIDE 0.9 % IV BOLUS (SEPSIS)
1000.0000 mL | Freq: Once | INTRAVENOUS | Status: AC
  Administered 2015-02-20: 1000 mL via INTRAVENOUS

## 2015-02-20 MED ORDER — ONDANSETRON HCL 4 MG/2ML IJ SOLN
INTRAMUSCULAR | Status: AC
  Administered 2015-02-20: 4 mg via INTRAVENOUS
  Filled 2015-02-20: qty 2

## 2015-02-20 MED ORDER — METOCLOPRAMIDE HCL 5 MG/ML IJ SOLN: 10.0000 mg | Freq: Once | INTRAMUSCULAR | Status: DC

## 2015-02-20 MED ORDER — ONDANSETRON HCL 4 MG/2ML IJ SOLN
4.0000 mg | Freq: Once | INTRAMUSCULAR | Status: AC
  Administered 2015-02-20: 4 mg via INTRAVENOUS
  Filled 2015-02-20: qty 2

## 2015-02-20 MED ORDER — LOPERAMIDE HCL 2 MG PO CAPS
2.0000 mg | ORAL_CAPSULE | Freq: Once | ORAL | Status: AC
  Administered 2015-02-20: 2 mg via ORAL
  Filled 2015-02-20: qty 1

## 2015-02-20 MED ORDER — ONDANSETRON HCL 4 MG/2ML IJ SOLN
4.0000 mg | Freq: Once | INTRAMUSCULAR | Status: AC
  Administered 2015-02-20: 4 mg via INTRAVENOUS

## 2015-02-20 MED ORDER — ONDANSETRON 4 MG PO TBDP
4.0000 mg | ORAL_TABLET | Freq: Three times a day (TID) | ORAL | Status: DC | PRN
Start: 2015-02-20 — End: 2016-04-21

## 2015-02-20 NOTE — ED Notes (Signed)
Pt reports nausea, vomiting and diarrhea x3 days, pt reports fever of 100.8 at home, reports rectal burning after diarrhea episodes and episodes of "tunnel vision, as if I was going to pass out". Pt also reports no urination since yesterday and also reports left flank.

## 2015-02-20 NOTE — ED Notes (Signed)
Patient has approx 100 ml left in NS bag. States she is feeling better. Patient drinking water for PO challenge.

## 2015-02-20 NOTE — ED Provider Notes (Signed)
[facility] Provider Note  Time seen: 1:46 PM  I have reviewed the triage vital signs and the nursing notes.   HISTORY  Chief Complaint Emesis and Diarrhea    HPI [name] is a 49 y.o. female with a past medical history of diabetes and hypertension presents the emergency department nausea, vomiting, diarrhea. According to the patient for the past 3 days she has had nausea vomiting and diarrhea as well as low-grade fevers 100.8 home per patient. Afebrile in the emergency department. Patient states she has not urinated since last night, and that she cannot keep fluids down. Denies any abdominal pain, chest pain. Denies black or bloody stool. Denies dysuria.     Past Medical History  Diagnosis Date  . Hypertension   . Diabetes mellitus without complication (HCC)     There are no active problems to display for this patient.   Past Surgical History  Procedure Laterality Date  . Cesarean section    . Appendectomy    . Knee arthroscopy    . Tonsillectomy      Current Outpatient Rx  Name  Route  Sig  Dispense  Refill  . FLUoxetine (PROZAC) 40 MG capsule   Oral   Take 40 mg by mouth daily.         Marland Kitchen. ketorolac (TORADOL) 10 MG tablet   Oral   Take 1 tablet (10 mg total) by mouth every 8 (eight) hours.   15 tablet   0   . lisinopril (PRINIVIL,ZESTRIL) 20 MG tablet   Oral   Take 20 mg by mouth daily.         . metFORMIN (GLUCOPHAGE) 500 MG tablet   Oral   Take by mouth 2 (two) times daily with a meal.         . methocarbamol (ROBAXIN) 500 MG tablet   Oral   Take 1 tablet (500 mg total) by mouth 2 (two) times daily.   20 tablet   0   . metoprolol succinate (TOPROL-XL) 100 MG 24 hr tablet   Oral   Take 100 mg by mouth daily. Take with or immediately following a meal.         . orphenadrine (NORFLEX) 100 MG tablet   Oral   Take 1 tablet (100 mg total) by mouth 2 (two) times daily.   20 tablet   0   .  predniSONE (DELTASONE) 20 MG tablet   Oral   Take 1 tablet (20 mg total) by mouth daily.   5 tablet   0     Allergies Flexeril; Celecoxib; Gabapentin; Morphine and related; Percocet; Skelaxin; Demerol; Tramadol; Ketorolac; and Norflex  Family History  Problem Relation Age of Onset  . Hyperlipidemia Mother   . Hypertension Father   . Diabetes Father   . Hyperlipidemia Father     Social History Social History  Substance Use Topics  . Smoking status: Current Every Day Smoker -- 0.50 packs/day for 15 years    Types: Cigarettes  . Smokeless tobacco: Never Used  . Alcohol Use: No    Review of Systems Constitutional: Negative for fever. Cardiovascular: Negative for chest pain. Respiratory: Negative for shortness of breath. Gastrointestinal: Negative for abdominal pain. Positive for nausea, vomiting, diarrhea. Genitourinary: Negative for dysuria. Musculoskeletal: Negative for back pain Neurological: Negative for headache 10-point ROS otherwise negative.  ____________________________________________   PHYSICAL EXAM:  VITAL SIGNS: ED Triage Vitals  Enc Vitals Group     BP 02/20/15 1140  157/73 mmHg     Pulse Rate 02/20/15 1140 66     Resp 02/20/15 1140 16     Temp 02/20/15 1140 98.7 F (37.1 C)     Temp Source 02/20/15 1140 Oral     SpO2 02/20/15 1140 98 %     Weight 02/20/15 1140 200 lb (90.719 kg)     Height 02/20/15 1140 5' (1.524 m)     Head Cir --      Peak Flow --      Pain Score 02/20/15 1140 6     Pain Loc --      Pain Edu? --      Excl. in GC? --     Constitutional: Alert and oriented. Well appearing and in no distress. Eyes: Normal exam ENT   Head: Normocephalic and atraumatic.   Mouth/Throat: Mucous membranes are moist. Cardiovascular: Normal rate, regular rhythm. No murmur Respiratory: Normal respiratory effort without tachypnea nor retractions. Breath sounds are clear Gastrointestinal: Soft and nontender. No distention.  Musculoskeletal:  Nontender with normal range of motion in all extremities. Neurologic:  Normal speech and language. No gross focal neurologic deficits  Skin:  Skin is warm, dry and intact.  Psychiatric: Mood and affect are normal. Speech and behavior are normal.  ____________________________________________    EKG  EKG reviewed and interpreted by myself shows normal sinus rhythm at 65 bpm, narrow QRS, normal axis, normal intervals, lateral T-wave inversions, largely unchanged from prior EKG 08/22/12.  ____________________________________________    INITIAL IMPRESSION / ASSESSMENT AND PLAN / ED COURSE  Pertinent labs & imaging results that were available during my care of the patient were reviewed by me and considered in my medical decision making (see chart for details).  Overall very well appearing female, no distress, playing on her cell phone. We will check labs, IV hydrate, IV medications with Zofran, and closely monitor in the emergency department. Patient has a nontender abdominal exam.  Labs are within normal limits. Urinalysis negative. Patient feels better after IV fluids and nausea medication. We'll discharge home with ODT Zofran, and over-the-counter loperamide. Patient agreeable to plan. ____________________________________________   FINAL CLINICAL IMPRESSION(S) / ED DIAGNOSES  Nausea and vomiting Diarrhea   Minna Antis, MD 02/20/15 1626

## 2015-02-20 NOTE — Discharge Instructions (Signed)

## 2015-03-28 ENCOUNTER — Encounter: Payer: Self-pay | Admitting: Emergency Medicine

## 2015-03-28 ENCOUNTER — Ambulatory Visit
Admission: EM | Admit: 2015-03-28 | Discharge: 2015-03-28 | Disposition: A | Discharge status: Home / Self Care | Payer: Managed Care, Other (non HMO) | Attending: Family Medicine | Admitting: Family Medicine

## 2015-03-28 DIAGNOSIS — J018 Other acute sinusitis: Secondary | ICD-10-CM

## 2015-03-28 DIAGNOSIS — J069 Acute upper respiratory infection, unspecified: Secondary | ICD-10-CM

## 2015-03-28 MED ORDER — AZITHROMYCIN 250 MG PO TABS: ORAL_TABLET | ORAL | Status: DC

## 2015-03-28 NOTE — ED Provider Notes (Addendum)
Patient presents today with symptoms of nasal congestion, cough, mild sore throat, mild ear pain for the last several days. Has been taking Sudafed. Patient denies fever, chest pain, shortness of breath, severe headache, nausea, vomiting, diarrhea, abdominal pain.  ROS: Negative except mentioned above.  Vitals as per Epic  GENERAL: NAD HEENT: mild pharyngeal erythema, no exudate, no erythema of TMs, no cervical LAD RESP: CTA B CARD: RRR NEURO: CN II-XII grossly intact  A/P:  URI, Sinusitis- will treat with Z-Pak, Claritin, Delsym when necessary. Discussed that Sudafed could raise her blood pressure. Patient will avoid taking  this at this time. Seek medical attention when symptoms persist or worsen as discussed.  Jolene ProvostKirtida Richard Ritchey, MD 03/28/15 [phone]  Jolene ProvostKirtida Krista Som, MD 03/28/15 [phone]

## 2015-03-28 NOTE — ED Notes (Signed)
Patient c/o nasal congestion, cough, chest congestion for four days.

## 2015-05-14 ENCOUNTER — Emergency Department: Payer: Managed Care, Other (non HMO)

## 2015-05-14 ENCOUNTER — Encounter: Payer: Self-pay | Admitting: *Deleted

## 2015-05-14 ENCOUNTER — Emergency Department
Admission: EM | Admit: 2015-05-14 | Discharge: 2015-05-14 | Disposition: A | Discharge status: Home / Self Care | Payer: Managed Care, Other (non HMO) | Attending: Emergency Medicine | Admitting: Emergency Medicine

## 2015-05-14 DIAGNOSIS — E119 Type 2 diabetes mellitus without complications: Secondary | ICD-10-CM | POA: Diagnosis not present

## 2015-05-14 DIAGNOSIS — Z7984 Long term (current) use of oral hypoglycemic drugs: Secondary | ICD-10-CM | POA: Diagnosis not present

## 2015-05-14 DIAGNOSIS — Z7982 Long term (current) use of aspirin: Secondary | ICD-10-CM | POA: Diagnosis not present

## 2015-05-14 DIAGNOSIS — Z792 Long term (current) use of antibiotics: Secondary | ICD-10-CM | POA: Diagnosis not present

## 2015-05-14 DIAGNOSIS — Z7952 Long term (current) use of systemic steroids: Secondary | ICD-10-CM | POA: Insufficient documentation

## 2015-05-14 DIAGNOSIS — F17210 Nicotine dependence, cigarettes, uncomplicated: Secondary | ICD-10-CM | POA: Insufficient documentation

## 2015-05-14 DIAGNOSIS — Z3202 Encounter for pregnancy test, result negative: Secondary | ICD-10-CM | POA: Diagnosis not present

## 2015-05-14 DIAGNOSIS — Z79899 Other long term (current) drug therapy: Secondary | ICD-10-CM | POA: Diagnosis not present

## 2015-05-14 DIAGNOSIS — A0811 Acute gastroenteropathy due to Norwalk agent: Secondary | ICD-10-CM

## 2015-05-14 DIAGNOSIS — Z791 Long term (current) use of non-steroidal anti-inflammatories (NSAID): Secondary | ICD-10-CM | POA: Insufficient documentation

## 2015-05-14 DIAGNOSIS — R112 Nausea with vomiting, unspecified: Secondary | ICD-10-CM | POA: Diagnosis present

## 2015-05-14 DIAGNOSIS — I10 Essential (primary) hypertension: Secondary | ICD-10-CM | POA: Diagnosis not present

## 2015-05-14 DIAGNOSIS — R197 Diarrhea, unspecified: Secondary | ICD-10-CM | POA: Diagnosis not present

## 2015-05-14 DIAGNOSIS — B9789 Other viral agents as the cause of diseases classified elsewhere: Secondary | ICD-10-CM | POA: Insufficient documentation

## 2015-05-14 DIAGNOSIS — R1084 Generalized abdominal pain: Secondary | ICD-10-CM

## 2015-05-14 LAB — URINALYSIS COMPLETE WITH MICROSCOPIC (ARMC ONLY)
BACTERIA UA: NONE SEEN
Bilirubin Urine: NEGATIVE
GLUCOSE, UA: NEGATIVE mg/dL
HGB URINE DIPSTICK: NEGATIVE
Ketones, ur: NEGATIVE mg/dL
Leukocytes, UA: NEGATIVE
NITRITE: NEGATIVE
PROTEIN: 100 mg/dL — AB
RBC / HPF: NONE SEEN RBC/hpf (ref 0–5)
SPECIFIC GRAVITY, URINE: 1.025 (ref 1.005–1.030)
pH: 5 (ref 5.0–8.0)

## 2015-05-14 LAB — GASTROINTESTINAL PANEL BY PCR, STOOL (REPLACES STOOL CULTURE)
ADENOVIRUS F40/41: NOT DETECTED
Astrovirus: NOT DETECTED
CAMPYLOBACTER SPECIES: NOT DETECTED
CRYPTOSPORIDIUM: NOT DETECTED
Cyclospora cayetanensis: NOT DETECTED
E. coli O157: NOT DETECTED
ENTAMOEBA HISTOLYTICA: NOT DETECTED
ENTEROAGGREGATIVE E COLI (EAEC): NOT DETECTED
ENTEROPATHOGENIC E COLI (EPEC): NOT DETECTED
Enterotoxigenic E coli (ETEC): NOT DETECTED
GIARDIA LAMBLIA: NOT DETECTED
Norovirus GI/GII: DETECTED — AB
PLESIMONAS SHIGELLOIDES: NOT DETECTED
Rotavirus A: NOT DETECTED
SALMONELLA SPECIES: NOT DETECTED
SHIGELLA/ENTEROINVASIVE E COLI (EIEC): NOT DETECTED
Sapovirus (I, II, IV, and V): NOT DETECTED
Shiga like toxin producing E coli (STEC): NOT DETECTED
VIBRIO CHOLERAE: NOT DETECTED
Vibrio species: NOT DETECTED
YERSINIA ENTEROCOLITICA: NOT DETECTED

## 2015-05-14 LAB — CBC
HEMATOCRIT: 44.4 % (ref 35.0–47.0)
HEMOGLOBIN: 13.6 g/dL (ref 12.0–16.0)
MCH: 21.2 pg — ABNORMAL LOW (ref 26.0–34.0)
MCHC: 30.7 g/dL — AB (ref 32.0–36.0)
MCV: 69.2 fL — AB (ref 80.0–100.0)
Platelets: 236 10*3/uL (ref 150–440)
RBC: 6.42 MIL/uL — ABNORMAL HIGH (ref 3.80–5.20)
RDW: 18.6 % — AB (ref 11.5–14.5)
WBC: 16.9 10*3/uL — ABNORMAL HIGH (ref 3.6–11.0)

## 2015-05-14 LAB — COMPREHENSIVE METABOLIC PANEL
ALBUMIN: 4.2 g/dL (ref 3.5–5.0)
ALT: 14 U/L (ref 14–54)
ANION GAP: 8 (ref 5–15)
AST: 19 U/L (ref 15–41)
Alkaline Phosphatase: 60 U/L (ref 38–126)
BUN: 13 mg/dL (ref 6–20)
CHLORIDE: 107 mmol/L (ref 101–111)
CO2: 23 mmol/L (ref 22–32)
Calcium: 8.8 mg/dL — ABNORMAL LOW (ref 8.9–10.3)
Creatinine, Ser: 0.71 mg/dL (ref 0.44–1.00)
GFR calc Af Amer: >60 mL/min (ref >60)
GFR calc non Af Amer: >60 mL/min (ref >60)
GLUCOSE: 111 mg/dL — AB (ref 65–99)
POTASSIUM: 4 mmol/L (ref 3.5–5.1)
SODIUM: 138 mmol/L (ref 135–145)
Total Bilirubin: 1 mg/dL (ref 0.3–1.2)
Total Protein: 7.6 g/dL (ref 6.5–8.1)

## 2015-05-14 LAB — C DIFFICILE QUICK SCREEN W PCR REFLEX
C DIFFICILE (CDIFF) INTERP: NEGATIVE
C DIFFICLE (CDIFF) ANTIGEN: NEGATIVE
C Diff toxin: NEGATIVE

## 2015-05-14 LAB — LIPASE, BLOOD: LIPASE: 39 U/L (ref 11–51)

## 2015-05-14 LAB — PREGNANCY, URINE: PREG TEST UR: NEGATIVE

## 2015-05-14 LAB — TROPONIN I: Troponin I: <0.03 ng/mL (ref <0.031)

## 2015-05-14 MED ORDER — FENTANYL CITRATE (PF) 100 MCG/2ML IJ SOLN
50.0000 ug | Freq: Once | INTRAMUSCULAR | Status: AC
  Administered 2015-05-14: 50 ug via INTRAVENOUS
  Filled 2015-05-14: qty 2

## 2015-05-14 MED ORDER — ONDANSETRON HCL 4 MG/2ML IJ SOLN
4.0000 mg | Freq: Once | INTRAMUSCULAR | Status: AC
  Administered 2015-05-14: 4 mg via INTRAVENOUS
  Filled 2015-05-14: qty 2

## 2015-05-14 MED ORDER — SODIUM CHLORIDE 0.9 % IV BOLUS (SEPSIS)
1000.0000 mL | Freq: Once | INTRAVENOUS | Status: AC
  Administered 2015-05-14: 1000 mL via INTRAVENOUS

## 2015-05-14 MED ORDER — FAMOTIDINE IN NACL 20-0.9 MG/50ML-% IV SOLN
20.0000 mg | Freq: Once | INTRAVENOUS | Status: AC
  Administered 2015-05-14: 20 mg via INTRAVENOUS
  Filled 2015-05-14: qty 50

## 2015-05-14 MED ORDER — ONDANSETRON HCL 4 MG PO TABS: 4.0000 mg | ORAL_TABLET | Freq: Every day | ORAL | Status: DC | PRN

## 2015-05-14 MED ORDER — IOHEXOL 240 MG/ML SOLN: 25.0000 mL | Freq: Once | INTRAMUSCULAR | Status: DC | PRN

## 2015-05-14 MED ORDER — ONDANSETRON HCL 4 MG/2ML IJ SOLN
4.0000 mg | Freq: Once | INTRAMUSCULAR | Status: AC
  Administered 2015-05-14: 4 mg via INTRAVENOUS

## 2015-05-14 MED ORDER — BARIUM SULFATE 2.1 % PO SUSP
450.0000 mL | ORAL | Status: AC
  Administered 2015-05-14 (×2): 450 mL via ORAL

## 2015-05-14 MED ORDER — ONDANSETRON 4 MG PO TBDP
4.0000 mg | ORAL_TABLET | Freq: Once | ORAL | Status: AC | PRN
  Administered 2015-05-14: 4 mg via ORAL
  Filled 2015-05-14: qty 1

## 2015-05-14 MED ORDER — DICYCLOMINE HCL 20 MG PO TABS: 20.0000 mg | ORAL_TABLET | Freq: Three times a day (TID) | ORAL | Status: DC | PRN

## 2015-05-14 MED ORDER — ONDANSETRON HCL 4 MG/2ML IJ SOLN
INTRAMUSCULAR | Status: AC
  Filled 2015-05-14: qty 2

## 2015-05-14 NOTE — Discharge Instructions (Signed)
Abdominal Pain, Adult Many things can cause belly (abdominal) pain. Most times, the belly pain is not dangerous. Many cases of belly pain can be watched and treated at home. HOME CARE   Do not take medicines that help you go poop (laxatives) unless told to by your doctor.  Only take medicine as told by your doctor.  Eat or drink as told by your doctor. Your doctor will tell you if you should be on a special diet. GET HELP IF:  You do not know what is causing your belly pain.  You have belly pain while you are sick to your stomach (nauseous) or have runny poop (diarrhea).  You have pain while you pee or poop.  Your belly pain wakes you up at night.  You have belly pain that gets worse or better when you eat.  You have belly pain that gets worse when you eat fatty foods.  You have a fever. GET HELP RIGHT AWAY IF:   The pain does not go away within 2 hours.  You keep throwing up (vomiting).  The pain changes and is only in the right or left part of the belly.  You have bloody or tarry looking poop. MAKE SURE YOU:   Understand these instructions.  Will watch your condition.  Will get help right away if you are not doing well or get worse.   This information is not intended to replace advice given to you by your health care provider. Make sure you discuss any questions you have with your health care provider.   Document Released: 08/31/2007 Document Revised: 04/04/2014 Document Reviewed: 11/21/2012 Elsevier Interactive Patient Education 2016 [place] [employer].  Diarrhea Diarrhea is frequent loose and watery bowel movements. It can cause you to feel weak and dehydrated. Dehydration can cause you to become tired and thirsty, have a dry mouth, and have decreased urination that often is dark yellow. Diarrhea is a sign of another problem, most often an infection that will not last long. In most cases, diarrhea typically lasts 2-3 days. However, it can last longer if it is a sign of  something more serious. It is important to treat your diarrhea as directed by your caregiver to lessen or prevent future episodes of diarrhea. CAUSES  Some common causes include:  Gastrointestinal infections caused by viruses, bacteria, or parasites.  Food poisoning or food allergies.  Certain medicines, such as antibiotics, chemotherapy, and laxatives.  Artificial sweeteners and fructose.  Digestive disorders. HOME CARE INSTRUCTIONS  Ensure adequate fluid intake (hydration): Have 1 cup (8 oz) of fluid for each diarrhea episode. Avoid fluids that contain simple sugars or sports drinks, fruit juices, whole milk products, and sodas. Your urine should be clear or pale yellow if you are drinking enough fluids. Hydrate with an oral rehydration solution that you can purchase at pharmacies, retail stores, and online. You can prepare an oral rehydration solution at home by mixing the following ingredients together:   - tsp table salt.   tsp baking soda.   tsp salt substitute containing potassium chloride.  1  tablespoons sugar.  1 L (34 oz) of water.  Certain foods and beverages may increase the speed at which food moves through the gastrointestinal (GI) tract. These foods and beverages should be avoided and include:  Caffeinated and alcoholic beverages.  High-fiber foods, such as raw fruits and vegetables, nuts, seeds, and whole grain breads and cereals.  Foods and beverages sweetened with sugar alcohols, such as xylitol, sorbitol, and mannitol.  Some  foods may be well tolerated and may help thicken stool including:  Starchy foods, such as rice, toast, pasta, low-sugar cereal, oatmeal, grits, baked potatoes, crackers, and bagels.  Bananas.  Applesauce.  Add probiotic-rich foods to help increase healthy bacteria in the GI tract, such as yogurt and fermented milk products.  Wash your hands well after each diarrhea episode.  Only take over-the-counter or prescription medicines  as directed by your caregiver.  Take a warm bath to relieve any burning or pain from frequent diarrhea episodes. SEEK IMMEDIATE MEDICAL CARE IF:   You are unable to keep fluids down.  You have persistent vomiting.  You have blood in your stool, or your stools are black and tarry.  You do not urinate in 6-8 hours, or there is only a small amount of very dark urine.  You have abdominal pain that increases or localizes.  You have weakness, dizziness, confusion, or light-headedness.  You have a severe headache.  Your diarrhea gets worse or does not get better.  You have a fever or persistent symptoms for more than 2-3 days.  You have a fever and your symptoms suddenly get worse. MAKE SURE YOU:   Understand these instructions.  Will watch your condition.  Will get help right away if you are not doing well or get worse.   This information is not intended to replace advice given to you by your health care provider. Make sure you discuss any questions you have with your health care provider.   Document Released: 03/04/2002 Document Revised: 04/04/2014 Document Reviewed: 11/20/2011 Elsevier Interactive Patient Education 2016 [employer]  Nausea and Vomiting Nausea is a sick feeling that often comes before throwing up (vomiting). Vomiting is a reflex where stomach contents come out of your mouth. Vomiting can cause severe loss of body fluids (dehydration). Children and elderly adults can become dehydrated quickly, especially if they also have diarrhea. Nausea and vomiting are symptoms of a condition or disease. It is important to find the cause of your symptoms. CAUSES   Direct irritation of the stomach lining. This irritation can result from increased acid production (gastroesophageal reflux disease), infection, food poisoning, taking certain medicines (such as nonsteroidal anti-inflammatory drugs), alcohol use, or tobacco use.  Signals from the brain.These signals could be  caused by a headache, heat exposure, an inner ear disturbance, increased pressure in the brain from injury, infection, a tumor, or a concussion, pain, emotional stimulus, or metabolic problems.  An obstruction in the gastrointestinal tract (bowel obstruction).  Illnesses such as diabetes, hepatitis, gallbladder problems, appendicitis, kidney problems, cancer, sepsis, atypical symptoms of a heart attack, or eating disorders.  Medical treatments such as chemotherapy and radiation.  Receiving medicine that makes you sleep (general anesthetic) during surgery. DIAGNOSIS Your caregiver may ask for tests to be done if the problems do not improve after a few days. Tests may also be done if symptoms are severe or if the reason for the nausea and vomiting is not clear. Tests may include:  Urine tests.  Blood tests.  Stool tests.  Cultures (to look for evidence of infection).  X-rays or other imaging studies. Test results can help your caregiver make decisions about treatment or the need for additional tests. TREATMENT You need to stay well hydrated. Drink frequently but in small amounts.You may wish to drink water, sports drinks, clear broth, or eat frozen ice pops or gelatin dessert to help stay hydrated.When you eat, eating slowly may help prevent nausea.There are also some antinausea medicines  that may help prevent nausea. HOME CARE INSTRUCTIONS   Take all medicine as directed by your caregiver.  If you do not have an appetite, do not force yourself to eat. However, you must continue to drink fluids.  If you have an appetite, eat a normal diet unless your caregiver tells you differently.  Eat a variety of complex carbohydrates (rice, wheat, potatoes, bread), lean meats, yogurt, fruits, and vegetables.  Avoid high-fat foods because they are more difficult to digest.  Drink enough water and fluids to keep your urine clear or pale yellow.  If you are dehydrated, ask your caregiver for  specific rehydration instructions. Signs of dehydration may include:  Severe thirst.  Dry lips and mouth.  Dizziness.  Dark urine.  Decreasing urine frequency and amount.  Confusion.  Rapid breathing or pulse. SEEK IMMEDIATE MEDICAL CARE IF:   You have blood or brown flecks (like coffee grounds) in your vomit.  You have black or bloody stools.  You have a severe headache or stiff neck.  You are confused.  You have severe abdominal pain.  You have chest pain or trouble breathing.  You do not urinate at least once every 8 hours.  You develop cold or clammy skin.  You continue to vomit for longer than 24 to 48 hours.  You have a fever. MAKE SURE YOU:   Understand these instructions.  Will watch your condition.  Will get help right away if you are not doing well or get worse.   This information is not intended to replace advice given to you by your health care provider. Make sure you discuss any questions you have with your health care provider.   Document Released: 03/14/2005 Document Revised: 06/06/2011 Document Reviewed: 08/11/2010 Elsevier Interactive Patient Education 2016 [employer].  Norovirus Infection A norovirus infection is caused by exposure to a virus in a group of similar viruses (noroviruses). This type of infection causes inflammation in your stomach and intestines (gastroenteritis). Norovirus is the most common cause of gastroenteritis. It also causes food poisoning. Anyone can get a norovirus infection. It spreads very easily (contagious). You can get it from contaminated food, water, surfaces, or other people. Norovirus is found in the stool or vomit of infected people. You can spread the infection as soon as you feel sick until 2 weeks after you recover.  Symptoms usually begin within 2 days after you become infected. Most norovirus symptoms affect the digestive system. CAUSES Norovirus infection is caused by contact with norovirus. You can  catch norovirus if you:  Eat or drink something contaminated with norovirus.  Touch surfaces or objects contaminated with norovirus and then put your hand in your mouth.  Have direct contact with an infected person who has symptoms.  Share food, drink, or utensils with someone with who is sick with norovirus. SIGNS AND SYMPTOMS Symptoms of norovirus may include:  Nausea.  Vomiting.  Diarrhea.  Stomach cramps.  Fever.  Chills.  Headache.  Muscle aches.  Tiredness. DIAGNOSIS Your health care provider may suspect norovirus based on your symptoms and physical exam. Your health care provider may also test a sample of your stool or vomit for the virus.  TREATMENT There is no specific treatment for norovirus. Most people get better without treatment in about 2 days. HOME CARE INSTRUCTIONS  Replace lost fluids by drinking plenty of water or rehydration fluids containing important minerals called electrolytes. This prevents dehydration. Drink enough fluid to keep your urine clear or pale yellow.  Do  not prepare food for others while you are infected. Wait at least 3 days after recovering from the illness to do that. PREVENTION   Wash your hands often, especially after using the toilet or changing a diaper.  Wash fruits and vegetables thoroughly before preparing or serving them.  Throw out any food that a sick person may have touched.  Disinfect contaminated surfaces immediately after someone in the household has been sick. Use a bleach-based household cleaner.  Immediately remove and wash soiled clothes or sheets. SEEK MEDICAL CARE IF:  Your vomiting, diarrhea, and stomach pain is getting worse.  Your symptoms of norovirus do not go away after 2-3 days. SEEK IMMEDIATE MEDICAL CARE IF:  You develop symptoms of dehydration that do not improve with fluid replacement. This may include:  Excessive sleepiness.  Lack of tears.  Dry mouth.  Dizziness when  standing.  Weak pulse.   This information is not intended to replace advice given to you by your health care provider. Make sure you discuss any questions you have with your health care provider.   Document Released: 06/04/2002 Document Revised: 04/04/2014 Document Reviewed: 08/22/2013 Elsevier Interactive Patient Education [employer]

## 2015-05-14 NOTE — ED Notes (Signed)
Pt tolerating fludis poorly, EDP made aware and given  zofran

## 2015-05-14 NOTE — ED Provider Notes (Signed)
[facility] Emergency Department Provider Note  ____________________________________________  Time seen: Approximately 350 PM  I have reviewed the triage vital signs and the nursing notes.   HISTORY  Chief Complaint Nausea; Emesis; and Fever    HPI [name] is a 50 y.o. female with a history of hypertension and diabetes who is presenting today with 3 days of runny nose, left ear pressure which then progressed to nausea vomiting and diarrhea around 4 AM this morning. She says that since this morning she has had about 14 episodes of diarrhea and 23 episodes of vomiting. She says that her son is also sick and has been vomiting. She denies any fevers. Says that her last antibiotic use was about 2 months ago when she had a Z-Pak for sinus infection. Says that there is some mucus in the stool. Denies any recent camping or drinking water from streams. Says that she has diffuse abdominal cramping but is worse in the upper abdomen. Denies any dysuria. Does not report any blood in the stool or vomitus.   Past Medical History  Diagnosis Date  . Hypertension   . Diabetes mellitus without complication (HCC)     There are no active problems to display for this patient.   Past Surgical History  Procedure Laterality Date  . Cesarean section    . Appendectomy    . Knee arthroscopy    . Tonsillectomy      Current Outpatient Rx  Name  Route  Sig  Dispense  Refill  . aspirin 81 MG tablet   Oral   Take 81 mg by mouth daily.         Marland Kitchen azithromycin (ZITHROMAX Z-PAK) 250 MG tablet      Use as instructed on box.   6 each   0   . DULoxetine (CYMBALTA) 60 MG capsule   Oral   Take 60 mg by mouth daily.         Marland Kitchen FLUoxetine (PROZAC) 40 MG capsule   Oral   Take 40 mg by mouth daily.         . isosorbide dinitrate (ISORDIL) 30 MG tablet   Oral   Take 60 mg by mouth daily.         Marland Kitchen ketorolac (TORADOL) 10 MG tablet   Oral   Take 1 tablet (10 mg total)  by mouth every 8 (eight) hours.   15 tablet   0   . lisinopril (PRINIVIL,ZESTRIL) 20 MG tablet   Oral   Take 40 mg by mouth daily.          . metFORMIN (GLUCOPHAGE) 500 MG tablet   Oral   Take by mouth 2 (two) times daily with a meal.         . methocarbamol (ROBAXIN) 500 MG tablet   Oral   Take 1 tablet (500 mg total) by mouth 2 (two) times daily.   20 tablet   0   . metoprolol succinate (TOPROL-XL) 100 MG 24 hr tablet   Oral   Take 100 mg by mouth 2 (two) times daily. Take with or immediately following a meal.         . ondansetron (ZOFRAN ODT) 4 MG disintegrating tablet   Oral   Take 1 tablet (4 mg total) by mouth every 8 (eight) hours as needed for nausea or vomiting.   20 tablet   0   . orphenadrine (NORFLEX) 100 MG tablet   Oral   Take 1 tablet (  100 mg total) by mouth 2 (two) times daily.   20 tablet   0   . pravastatin (PRAVACHOL) 20 MG tablet   Oral   Take 20 mg by mouth daily.         . predniSONE (DELTASONE) 20 MG tablet   Oral   Take 1 tablet (20 mg total) by mouth daily.   5 tablet   0     Allergies Flexeril; Celecoxib; Gabapentin; Iodinated diagnostic agents; Morphine and related; Percocet; Skelaxin; Demerol; Tramadol; Ketorolac; and Norflex  Family History  Problem Relation Age of Onset  . Hyperlipidemia Mother   . Hypertension Father   . Diabetes Father   . Hyperlipidemia Father     Social History Social History  Substance Use Topics  . Smoking status: Current Every Day Smoker -- 0.50 packs/day for 15 years    Types: Cigarettes  . Smokeless tobacco: Never Used  . Alcohol Use: No    Review of Systems Constitutional: No fever/chills Eyes: No visual changes. ENT: No sore throat. Cardiovascular: Denies chest pain. Respiratory: Denies shortness of breath. Gastrointestinal:No constipation. Genitourinary: Negative for dysuria. Musculoskeletal: Negative for back pain. Skin: Negative for rash. Neurological: Negative for  headaches, focal weakness or numbness.  10-point ROS otherwise negative.  ____________________________________________   PHYSICAL EXAM:  VITAL SIGNS: ED Triage Vitals  Enc Vitals Group     BP 05/14/15 1407 173/89 mmHg     Pulse Rate 05/14/15 1407 94     Resp 05/14/15 1407 22     Temp 05/14/15 1407 98.9 F (37.2 C)     Temp Source 05/14/15 1407 Oral     SpO2 05/14/15 1407 100 %     Weight 05/14/15 1407 222 lb (100.699 kg)     Height 05/14/15 1407 4' 11.5" (1.511 m)     Head Cir --      Peak Flow --      Pain Score 05/14/15 1414 9     Pain Loc --      Pain Edu? --      Excl. in GC? --     Constitutional: Alert and oriented. Well appearing and in no acute distress. Eyes: Conjunctivae are normal. PERRL. EOMI. Head: Atraumatic. TMs normal bilaterally. Nose: Mild amount of clear rhinorrhea at the bilateral nares. Mouth/Throat: Mucous membranes are moist.  Oropharynx non-erythematous. Neck: No stridor.   Cardiovascular: Normal rate, regular rhythm. Grossly normal heart sounds.  Good peripheral circulation. Respiratory: Normal respiratory effort.  No retractions. Lungs CTAB. Gastrointestinal: Soft and tender to palpation diffusely but worse to the left upper epigastric and right upper quadrants. There is negative Murphy sign. No rebound or guarding. No abdominal bruits. No CVA tenderness. Musculoskeletal: No lower extremity tenderness nor edema.  No joint effusions. Neurologic:  Normal speech and language. No gross focal neurologic deficits are appreciated. No gait instability. Skin:  Skin is warm, dry and intact. No rash noted. Psychiatric: Mood and affect are normal. Speech and behavior are normal.  ____________________________________________   LABS (all labs ordered are listed, but only abnormal results are displayed)  Labs Reviewed  GASTROINTESTINAL PANEL BY PCR, STOOL (REPLACES STOOL CULTURE) - Abnormal; Notable for the following:    Norovirus GI/GII DETECTED (*)     All other components within normal limits  COMPREHENSIVE METABOLIC PANEL - Abnormal; Notable for the following:    Glucose, Bld 111 (*)    Calcium 8.8 (*)    All other components within normal limits  CBC - Abnormal; Notable for  the following:    WBC 16.9 (*)    RBC 6.42 (*)    MCV 69.2 (*)    MCH 21.2 (*)    MCHC 30.7 (*)    RDW 18.6 (*)    All other components within normal limits  URINALYSIS COMPLETEWITH MICROSCOPIC ([facility] ONLY) - Abnormal; Notable for the following:    Color, Urine YELLOW (*)    APPearance HAZY (*)    Protein, ur 100 (*)    Squamous Epithelial / LPF 6-30 (*)    All other components within normal limits  C DIFFICILE QUICK SCREEN W PCR REFLEX  LIPASE, BLOOD  PREGNANCY, URINE  TROPONIN I   ____________________________________________  EKG  ED ECG REPORT I, Arelia Longest, the attending physician, personally viewed and interpreted this ECG.   Date: 05/14/2015  EKG Time: 1735  Rate: 76  Rhythm: normal sinus rhythm  Axis: Normal axis  Intervals:none  ST&T Change: Left ventricular hypertrophy with repolarization abnormality. T-wave inversions in 2, 3 and aVF as well as V5 and V6. Previous EKG from 08/22/2012 with similar changes except no inversions in 3 and aVF.  ____________________________________________  RADIOLOGY   IMPRESSION: 1. No findings of bowel obstruction or bowel wall thickening. That said, the oral contrast extends all the way through to the rectum, which might indicating accelerated transit time through the bowel as part of a diarrheal process. 2. Lumbar spondylosis and degenerative disc disease, causing impingement at T12- L1, L3-4, L4-5, and L5-S1. 3. Mild sclerosis along the right inferior pubis, likely due to arthropathy. ____________________________________________   PROCEDURES  ____________________________________________   INITIAL IMPRESSION / ASSESSMENT AND PLAN / ED COURSE  Pertinent labs & imaging results  that were available during my care of the patient were reviewed by me and considered in my medical decision making (see chart for details).  ----------------------------------------- 7:55 PM on 05/14/2015 -----------------------------------------  Patient able to tolerate oral contrast after Zofran. Stool studies were positive for neurovirus. Reassuring CAT scan. Will be discharged home. I did discuss the lab as well as imaging results with the patient. She says she'll be following up with her primary care doctor in the morning. We'll discharge with Zofran as well as Bentyl. She knows to return for any worsening or concerning symptoms. Respirations at 18. ____________________________________________   FINAL CLINICAL IMPRESSION(S) / ED DIAGNOSES  Norovirus. Abdominal pain. Nausea vomiting and diarrhea.    Myrna Blazer, MD 05/14/15 [phone]

## 2015-05-14 NOTE — ED Notes (Signed)
States from this AM she has thrown up 23 times and had diaherra 12 times, states generalized abd pain and back pain, states mucous in her stools, states chills

## 2015-05-29 ENCOUNTER — Ambulatory Visit
Admission: EM | Admit: 2015-05-29 | Discharge: 2015-05-29 | Disposition: A | Discharge status: Home / Self Care | Payer: Managed Care, Other (non HMO) | Attending: Family Medicine | Admitting: Family Medicine

## 2015-05-29 ENCOUNTER — Encounter: Payer: Self-pay | Admitting: *Deleted

## 2015-05-29 DIAGNOSIS — M5489 Other dorsalgia: Secondary | ICD-10-CM | POA: Diagnosis not present

## 2015-05-29 MED ORDER — HYDROCODONE-ACETAMINOPHEN 5-325 MG PO TABS: 1.0000 | ORAL_TABLET | Freq: Four times a day (QID) | ORAL | Status: DC | PRN
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
## 2015-05-29 NOTE — ED Provider Notes (Signed)
CSN: [id number]     Arrival date & time 05/29/15  0900 History   First MD Initiated Contact with Patient 05/29/15 1054     Chief Complaint  Patient presents with  . Back Pain   (Consider location/radiation/quality/duration/timing/severity/associated sxs/prior Treatment) HPI Comments: 50 yo female with c/o low back pain since slipping and falling on ice 2 months ago. States has been seen several times for this at different locations and has had x-ray done which patient states "only showed arthritis". States pain has not improved. Denies any bowel/bladder problems or saddle anesthesia. States pain radiates down the buttocks and legs intermittently.   The history is provided by the patient.    Past Medical History  Diagnosis Date  . Hypertension   . Diabetes mellitus without complication (HCC)   . Coronary artery disease    Past Surgical History  Procedure Laterality Date  . Cesarean section    . Appendectomy    . Knee arthroscopy    . Tonsillectomy     Family History  Problem Relation Age of Onset  . Hyperlipidemia Mother   . Hypertension Father   . Diabetes Father   . Hyperlipidemia Father    Social History  Substance Use Topics  . Smoking status: Current Every Day Smoker -- 0.50 packs/day for 15 years    Types: Cigarettes  . Smokeless tobacco: Never Used  . Alcohol Use: No   OB History    No data available     Review of Systems  Allergies  Flexeril; Celecoxib; Gabapentin; Iodinated diagnostic agents; Morphine and related; Percocet; Skelaxin; Demerol; Tramadol; Ketorolac; and Norflex  Home Medications   Prior to Admission medications   Medication Sig Start Date End Date Taking? Authorizing Provider  aspirin 81 MG tablet Take 81 mg by mouth daily.   Yes Historical Provider, MD  DULoxetine (CYMBALTA) 60 MG capsule Take 60 mg by mouth daily.   Yes Historical Provider, MD  esomeprazole (NEXIUM) 20 MG capsule Take 20 mg by mouth daily at 12 noon.   Yes Historical  Provider, MD  isosorbide dinitrate (ISORDIL) 30 MG tablet Take 60 mg by mouth daily.   Yes Historical Provider, MD  lisinopril (PRINIVIL,ZESTRIL) 20 MG tablet Take 40 mg by mouth daily.    Yes Historical Provider, MD  metFORMIN (GLUCOPHAGE) 500 MG tablet Take by mouth 2 (two) times daily with a meal.   Yes Historical Provider, MD  methocarbamol (ROBAXIN) 500 MG tablet Take 1 tablet (500 mg total) by mouth 2 (two) times daily. 01/06/15  Yes Payton Mccallum, MD  metoprolol succinate (TOPROL-XL) 100 MG 24 hr tablet Take 100 mg by mouth 2 (two) times daily. Take with or immediately following a meal.   Yes Historical Provider, MD  pravastatin (PRAVACHOL) 20 MG tablet Take 20 mg by mouth daily.   Yes Historical Provider, MD  azithromycin (ZITHROMAX Z-PAK) 250 MG tablet Use as instructed on box. 03/28/15   Jolene Provost, MD  dicyclomine (BENTYL) 20 MG tablet Take 1 tablet (20 mg total) by mouth 3 (three) times daily as needed for spasms. 05/14/15 05/13/16  Myrna Blazer, MD  FLUoxetine (PROZAC) 40 MG capsule Take 40 mg by mouth daily.    Historical Provider, MD  HYDROcodone-acetaminophen (NORCO/VICODIN) 5-325 MG tablet Take 1-2 tablets by mouth every 6 (six) hours as needed. 05/29/15   Payton Mccallum, MD  ketorolac (TORADOL) 10 MG tablet Take 1 tablet (10 mg total) by mouth every 8 (eight) hours. 08/30/14   Charlesetta Ivory Menshew,  PA-C  ondansetron (ZOFRAN ODT) 4 MG disintegrating tablet Take 1 tablet (4 mg total) by mouth every 8 (eight) hours as needed for nausea or vomiting. 02/20/15   Minna AntisKevin Paduchowski, MD  ondansetron (ZOFRAN) 4 MG tablet Take 1 tablet (4 mg total) by mouth daily as needed. 05/14/15   Myrna Blazeravid Matthew Schaevitz, MD  orphenadrine (NORFLEX) 100 MG tablet Take 1 tablet (100 mg total) by mouth 2 (two) times daily. 08/30/14   Jenise V Bacon Menshew, PA-C  predniSONE (DELTASONE) 20 MG tablet Take 1 tablet (20 mg total) by mouth daily. 01/06/15   Payton Mccallumrlando Elin Fenley, MD   Meds Ordered and Administered  this Visit  Medications - No data to display  BP 140/90 mmHg  Pulse 68  Temp(Src) 98.4 F (36.9 C) (Oral)  Resp 16  Ht 4\' 11"  (1.499 m)  Wt 210 lb (95.255 kg)  BMI 42.39 kg/m2  SpO2 100%  LMP 05/13/2015 (Approximate) No data found.   Physical Exam  Constitutional: She appears well-developed and well-nourished. No distress.  Musculoskeletal: She exhibits tenderness. She exhibits no edema.       Lumbar back: She exhibits tenderness (over the lumbar sacral paraspinous muscles) and spasm. She exhibits normal range of motion, no bony tenderness, no swelling, no edema, no deformity, no laceration, no pain and normal pulse.  Neurological: She is alert. She has normal reflexes. She exhibits normal muscle tone.  Skin: Skin is warm and dry. No rash noted. She is not diaphoretic. No erythema.  Nursing note and vitals reviewed.   ED Course  Procedures (including critical care time)  Labs Review Labs Reviewed - No data to display  Imaging Review No results found.   Visual Acuity Review  Right Eye Distance:   Left Eye Distance:   Bilateral Distance:    Right Eye Near:   Left Eye Near:    Bilateral Near:         MDM   1. Other back pain    Discharge Medication List as of 05/29/2015 11:52 AM    START taking these medications   Details  HYDROcodone-acetaminophen (NORCO/VICODIN) 5-325 MG tablet Take 1-2 tablets by mouth every 6 (six) hours as needed., Starting 05/29/2015, Until Discontinued, Print       1.  diagnosis reviewed with patient 2. rx as per orders above; reviewed possible side effects, interactions, risks and benefits; (ask patient about allergies mentioned in chart; patient states she can take and has taken vicodin without allergic reaction; #6 tabs only given) 3. Recommend supportive treatment with heat, rest 4. Follow-up with PCP for further management/possible referral to pain specialist  Payton Mccallumrlando Mychael Smock, MD 05/29/15 1320

## 2015-06-09 ENCOUNTER — Emergency Department: Payer: Managed Care, Other (non HMO)

## 2015-06-09 DIAGNOSIS — J209 Acute bronchitis, unspecified: Secondary | ICD-10-CM | POA: Diagnosis not present

## 2015-06-09 DIAGNOSIS — F17210 Nicotine dependence, cigarettes, uncomplicated: Secondary | ICD-10-CM | POA: Diagnosis not present

## 2015-06-09 DIAGNOSIS — Z7982 Long term (current) use of aspirin: Secondary | ICD-10-CM | POA: Diagnosis not present

## 2015-06-09 DIAGNOSIS — Z79899 Other long term (current) drug therapy: Secondary | ICD-10-CM | POA: Diagnosis not present

## 2015-06-09 DIAGNOSIS — I10 Essential (primary) hypertension: Secondary | ICD-10-CM | POA: Insufficient documentation

## 2015-06-09 DIAGNOSIS — E119 Type 2 diabetes mellitus without complications: Secondary | ICD-10-CM | POA: Insufficient documentation

## 2015-06-09 DIAGNOSIS — Z7952 Long term (current) use of systemic steroids: Secondary | ICD-10-CM | POA: Insufficient documentation

## 2015-06-09 DIAGNOSIS — Z7984 Long term (current) use of oral hypoglycemic drugs: Secondary | ICD-10-CM | POA: Diagnosis not present

## 2015-06-09 DIAGNOSIS — R079 Chest pain, unspecified: Secondary | ICD-10-CM | POA: Diagnosis present

## 2015-06-09 LAB — COMPREHENSIVE METABOLIC PANEL
ALK PHOS: 72 U/L (ref 38–126)
ALT: 17 U/L (ref 14–54)
AST: 26 U/L (ref 15–41)
Albumin: 4.1 g/dL (ref 3.5–5.0)
Anion gap: 8 (ref 5–15)
BILIRUBIN TOTAL: 0.4 mg/dL (ref 0.3–1.2)
BUN: 15 mg/dL (ref 6–20)
CALCIUM: 8.8 mg/dL — AB (ref 8.9–10.3)
CHLORIDE: 108 mmol/L (ref 101–111)
CO2: 20 mmol/L — ABNORMAL LOW (ref 22–32)
CREATININE: 0.77 mg/dL (ref 0.44–1.00)
Glucose, Bld: 197 mg/dL — ABNORMAL HIGH (ref 65–99)
Potassium: 3.6 mmol/L (ref 3.5–5.1)
Sodium: 136 mmol/L (ref 135–145)
Total Protein: 7.5 g/dL (ref 6.5–8.1)

## 2015-06-09 LAB — TROPONIN I

## 2015-06-09 LAB — CBC
HEMATOCRIT: 37.8 % (ref 35.0–47.0)
HEMOGLOBIN: 11.8 g/dL — AB (ref 12.0–16.0)
MCH: 21.3 pg — AB (ref 26.0–34.0)
MCHC: 31.3 g/dL — ABNORMAL LOW (ref 32.0–36.0)
MCV: 68.2 fL — AB (ref 80.0–100.0)
Platelets: 273 10*3/uL (ref 150–440)
RBC: 5.55 MIL/uL — AB (ref 3.80–5.20)
RDW: 18.5 % — ABNORMAL HIGH (ref 11.5–14.5)
WBC: 10.3 10*3/uL (ref 3.6–11.0)

## 2015-06-09 MED ORDER — ACETAMINOPHEN 500 MG PO TABS
1000.0000 mg | ORAL_TABLET | Freq: Once | ORAL | Status: AC
  Administered 2015-06-09: 1000 mg via ORAL

## 2015-06-09 MED ORDER — ACETAMINOPHEN 500 MG PO TABS
ORAL_TABLET | ORAL | Status: AC
  Administered 2015-06-09: 1000 mg via ORAL
  Filled 2015-06-09: qty 2

## 2015-06-09 NOTE — ED Notes (Signed)
Patient to ED via POV from home with acute onset of chest pain about 45 mins PTA. States she was watching television and had onset of chest pain with left elbow to shoulder radiation. Also states she had sensation of not being able to get a full breath. Patient is not in obvious respiratory distress at this time and can answer questions without difficulty. No circumoral cyanosis or pursed lip breathing noted. States nausea without vomiting but has felt sick for a few days.

## 2015-06-10 ENCOUNTER — Emergency Department
Admission: EM | Admit: 2015-06-10 | Discharge: 2015-06-10 | Disposition: A | Discharge status: Home / Self Care | Payer: Managed Care, Other (non HMO) | Attending: Emergency Medicine | Admitting: Emergency Medicine

## 2015-06-10 DIAGNOSIS — J209 Acute bronchitis, unspecified: Secondary | ICD-10-CM

## 2015-06-10 LAB — TROPONIN I

## 2015-06-10 LAB — FIBRIN DERIVATIVES D-DIMER (ARMC ONLY): Fibrin derivatives D-dimer (ARMC): 614 — ABNORMAL HIGH (ref 0–499)

## 2015-06-10 MED ORDER — FENTANYL CITRATE (PF) 100 MCG/2ML IJ SOLN
25.0000 ug | Freq: Once | INTRAMUSCULAR | Status: AC
  Administered 2015-06-10: 25 ug via INTRAVENOUS
  Filled 2015-06-10: qty 2

## 2015-06-10 MED ORDER — HYDROCODONE-ACETAMINOPHEN 5-325 MG PO TABS
1.0000 | ORAL_TABLET | Freq: Once | ORAL | Status: AC
  Administered 2015-06-10: 1 via ORAL
  Filled 2015-06-10: qty 1

## 2015-06-10 MED ORDER — HYDROCODONE-ACETAMINOPHEN 5-325 MG PO TABS: 1.0000 | ORAL_TABLET | Freq: Four times a day (QID) | ORAL | Status: DC | PRN

## 2015-06-10 NOTE — Discharge Instructions (Signed)

## 2015-06-10 NOTE — ED Provider Notes (Signed)
[facility] Emergency Department Provider Note  ____________________________________________  Time seen: 11:30 PM  I have reviewed the triage vital signs and the nursing notes.   HISTORY  Chief Complaint Chest Pain      HPI [name] is a 50 y.o. female presents with generalized chest discomfort, coughing, congestion for "past few days. Patient states that she was seen by primary care provider and diagnosed with bronchitis prescribed Tussionex azithromycin and prednisone and albuterol nebulize treatments at home. Patient states however that 45 minutes before presentation chest discomfort worsened after coughing spell. Patient states that the pain is worse with deep inspiration and chest palpation. Patient denies any history of DVT or PE.     Past Medical History  Diagnosis Date  . Hypertension   . Diabetes mellitus without complication (HCC)     There are no active problems to display for this patient.   Past Surgical History  Procedure Laterality Date  . Cesarean section    . Appendectomy    . Knee arthroscopy    . Tonsillectomy      Current Outpatient Rx  Name  Route  Sig  Dispense  Refill  . aspirin 81 MG tablet   Oral   Take 81 mg by mouth daily.         Marland Kitchen azithromycin (ZITHROMAX Z-PAK) 250 MG tablet      Use as instructed on box.   6 each   0   . dicyclomine (BENTYL) 20 MG tablet   Oral   Take 1 tablet (20 mg total) by mouth 3 (three) times daily as needed for spasms.   15 tablet   0   . DULoxetine (CYMBALTA) 60 MG capsule   Oral   Take 60 mg by mouth daily.         Marland Kitchen esomeprazole (NEXIUM) 20 MG capsule   Oral   Take 20 mg by mouth daily at 12 noon.         Marland Kitchen FLUoxetine (PROZAC) 40 MG capsule   Oral   Take 40 mg by mouth daily.         Marland Kitchen HYDROcodone-acetaminophen (NORCO/VICODIN) 5-325 MG tablet   Oral   Take 1-2 tablets by mouth every 6 (six) hours as needed.   6 tablet   0   . HYDROcodone-acetaminophen  (NORCO/VICODIN) 5-325 MG tablet   Oral   Take 1 tablet by mouth every 6 (six) hours as needed for moderate pain.   12 tablet   0   . isosorbide dinitrate (ISORDIL) 30 MG tablet   Oral   Take 60 mg by mouth daily.         Marland Kitchen ketorolac (TORADOL) 10 MG tablet   Oral   Take 1 tablet (10 mg total) by mouth every 8 (eight) hours.   15 tablet   0   . lisinopril (PRINIVIL,ZESTRIL) 20 MG tablet   Oral   Take 40 mg by mouth daily.          . metFORMIN (GLUCOPHAGE) 500 MG tablet   Oral   Take by mouth 2 (two) times daily with a meal.         . methocarbamol (ROBAXIN) 500 MG tablet   Oral   Take 1 tablet (500 mg total) by mouth 2 (two) times daily.   20 tablet   0   . metoprolol succinate (TOPROL-XL) 100 MG 24 hr tablet   Oral   Take 100 mg by mouth 2 (two) times daily.  Take with or immediately following a meal.         . ondansetron (ZOFRAN ODT) 4 MG disintegrating tablet   Oral   Take 1 tablet (4 mg total) by mouth every 8 (eight) hours as needed for nausea or vomiting.   20 tablet   0   . ondansetron (ZOFRAN) 4 MG tablet   Oral   Take 1 tablet (4 mg total) by mouth daily as needed.   10 tablet   0   . orphenadrine (NORFLEX) 100 MG tablet   Oral   Take 1 tablet (100 mg total) by mouth 2 (two) times daily.   20 tablet   0   . pravastatin (PRAVACHOL) 20 MG tablet   Oral   Take 20 mg by mouth daily.         . predniSONE (DELTASONE) 20 MG tablet   Oral   Take 1 tablet (20 mg total) by mouth daily.   5 tablet   0     Allergies Flexeril; Celecoxib; Gabapentin; Iodinated diagnostic agents; Morphine and related; Percocet; Skelaxin; Demerol; Tramadol; Ketorolac; and Norflex  Family History  Problem Relation Age of Onset  . Hyperlipidemia Mother   . Hypertension Father   . Diabetes Father   . Hyperlipidemia Father     Social History Social History  Substance Use Topics  . Smoking status: Current Every Day Smoker -- 0.50 packs/day for 15 years     Types: Cigarettes  . Smokeless tobacco: Never Used  . Alcohol Use: No    Review of Systems  Constitutional: Negative for fever. Eyes: Negative for visual changes. ENT: Negative for sore throat. Cardiovascular: Positive for chest pain. Respiratory: Positive for shortness of breath. Gastrointestinal: Negative for abdominal pain, vomiting and diarrhea. Genitourinary: Negative for dysuria. Musculoskeletal: Negative for back pain. Skin: Negative for rash. Neurological: Negative for headaches, focal weakness or numbness.  10-point ROS otherwise negative.  ____________________________________________   PHYSICAL EXAM:  VITAL SIGNS: ED Triage Vitals  Enc Vitals Group     BP 06/09/15 2018 178/79 mmHg     Pulse Rate 06/09/15 2018 78     Resp 06/09/15 2018 18     Temp 06/09/15 2018 98.8 F (37.1 C)     Temp Source 06/09/15 2018 Oral     SpO2 06/09/15 2018 98 %     Weight 06/09/15 2018 221 lb (100.245 kg)     Height 06/09/15 2018  (1.499 m)     Head Cir --      Peak Flow --      Pain Score 06/09/15 2019 9     Pain Loc --      Pain Edu? --      Excl. in GC? --     Constitutional: Alert and oriented. Well appearing and in no distress. Eyes: Conjunctivae are normal. PERRL. Normal extraocular movements. ENT   Head: Normocephalic and atraumatic.   Nose: No congestion/rhinnorhea.   Mouth/Throat: Mucous membranes are moist.   Neck: No stridor. Hematological/Lymphatic/Immunilogical: No cervical lymphadenopathy. Cardiovascular: Normal rate, regular rhythm. Normal and symmetric distal pulses are present in all extremities. No murmurs, rubs, or gallops.Diffuse chest wall discomfort with palpation anteriorly Respiratory: Normal respiratory effort without tachypnea nor retractions. Breath sounds are clear and equal bilaterally. No wheezes/rales/rhonchi. Gastrointestinal: Soft and nontender. No distention. There is no CVA tenderness. Genitourinary:  deferred Musculoskeletal: Nontender with normal range of motion in all extremities. No joint effusions.  No lower extremity tenderness nor edema. Neurologic:  Normal  speech and language. No gross focal neurologic deficits are appreciated. Speech is normal.  Skin:  Skin is warm, dry and intact. No rash noted. Psychiatric: Mood and affect are normal. Speech and behavior are normal. Patient exhibits appropriate insight and judgment.  ____________________________________________    LABS (pertinent positives/negatives)  Labs Reviewed  CBC - Abnormal; Notable for the following:    RBC 5.55 (*)    Hemoglobin 11.8 (*)    MCV 68.2 (*)    MCH 21.3 (*)    MCHC 31.3 (*)    RDW 18.5 (*)    All other components within normal limits  COMPREHENSIVE METABOLIC PANEL - Abnormal; Notable for the following:    CO2 20 (*)    Glucose, Bld 197 (*)    Calcium 8.8 (*)    All other components within normal limits  FIBRIN DERIVATIVES D-DIMER ([facility] ONLY) - Abnormal; Notable for the following:    Fibrin derivatives D-dimer ([facility]) 614 (*)    All other components within normal limits  TROPONIN I  TROPONIN I     ____________________________________________   EKG  ED ECG REPORT I, [place] BROWN, the attending physician, personally viewed and interpreted this ECG.   Date: 06/10/2015  EKG Time: 8:18 PM  Rate: 76  Rhythm: Normal sinus rhythm  Axis: None  Intervals: Normal  ST&T Change: None   ____________________________________________    RADIOLOGY  DG Chest 2 View (Final result) Result time: 06/09/15 20:51:52   Final result by Rad Results In Interface (06/09/15 20:51:52)   Narrative:   CLINICAL DATA: Central chest pain, nausea. Cough for 2 weeks.  EXAM: CHEST 2 VIEW  COMPARISON: Chest x-rays dated 08/22/2012 and 03/08/2012.  FINDINGS: Heart size is normal. Overall cardiomediastinal silhouette is normal in size and configuration. Lungs are clear. No pleural effusion.  No pneumothorax.  Mild degenerative spurring again noted within the thoracic spine. No acute - appearing osseous abnormality.  IMPRESSION: No active cardiopulmonary disease.   Electronically Signed By: Bary RichardStan Maynard M.D. On: 06/09/2015 20:51          INITIAL IMPRESSION / ASSESSMENT AND PLAN / ED COURSE  Pertinent labs & imaging results that were available during my care of the patient were reviewed by me and considered in my medical decision making (see chart for details).  History of physical exam concerning for acute bronchitis. EKG revealed no evidence of ST segment elevation or depression, cardiac enzymes negative 2.  ____________________________________________   FINAL CLINICAL IMPRESSION(S) / ED DIAGNOSES  Final diagnoses:  Acute bronchitis, unspecified organism      Darci Currentandolph N Brown, MD 06/10/15 [phone]

## 2015-09-21 DIAGNOSIS — G8929 Other chronic pain: Secondary | ICD-10-CM | POA: Insufficient documentation

## 2015-09-21 DIAGNOSIS — M5441 Lumbago with sciatica, right side: Secondary | ICD-10-CM

## 2015-09-21 DIAGNOSIS — M5442 Lumbago with sciatica, left side: Secondary | ICD-10-CM

## 2016-03-23 DIAGNOSIS — J449 Chronic obstructive pulmonary disease, unspecified: Secondary | ICD-10-CM | POA: Insufficient documentation

## 2016-04-20 ENCOUNTER — Encounter: Payer: Self-pay | Admitting: Emergency Medicine

## 2016-04-20 ENCOUNTER — Emergency Department: Payer: Managed Care, Other (non HMO)

## 2016-04-20 DIAGNOSIS — Z87891 Personal history of nicotine dependence: Secondary | ICD-10-CM | POA: Diagnosis not present

## 2016-04-20 DIAGNOSIS — E119 Type 2 diabetes mellitus without complications: Secondary | ICD-10-CM | POA: Insufficient documentation

## 2016-04-20 DIAGNOSIS — I2510 Atherosclerotic heart disease of native coronary artery without angina pectoris: Secondary | ICD-10-CM | POA: Diagnosis not present

## 2016-04-20 DIAGNOSIS — M329 Systemic lupus erythematosus, unspecified: Secondary | ICD-10-CM | POA: Insufficient documentation

## 2016-04-20 DIAGNOSIS — Z79899 Other long term (current) drug therapy: Secondary | ICD-10-CM | POA: Diagnosis not present

## 2016-04-20 DIAGNOSIS — E785 Hyperlipidemia, unspecified: Secondary | ICD-10-CM | POA: Diagnosis not present

## 2016-04-20 DIAGNOSIS — Z7984 Long term (current) use of oral hypoglycemic drugs: Secondary | ICD-10-CM | POA: Insufficient documentation

## 2016-04-20 DIAGNOSIS — I10 Essential (primary) hypertension: Secondary | ICD-10-CM | POA: Diagnosis not present

## 2016-04-20 DIAGNOSIS — R079 Chest pain, unspecified: Secondary | ICD-10-CM | POA: Diagnosis not present

## 2016-04-20 DIAGNOSIS — Z7982 Long term (current) use of aspirin: Secondary | ICD-10-CM | POA: Insufficient documentation

## 2016-04-20 LAB — BASIC METABOLIC PANEL
Anion gap: 7 (ref 5–15)
BUN: 15 mg/dL (ref 6–20)
CHLORIDE: 109 mmol/L (ref 101–111)
CO2: 24 mmol/L (ref 22–32)
CREATININE: 0.92 mg/dL (ref 0.44–1.00)
Calcium: 8.6 mg/dL — ABNORMAL LOW (ref 8.9–10.3)
GFR calc Af Amer: >60 mL/min (ref >60)
GFR calc non Af Amer: >60 mL/min (ref >60)
Glucose, Bld: 91 mg/dL (ref 65–99)
POTASSIUM: 3.7 mmol/L (ref 3.5–5.1)
Sodium: 140 mmol/L (ref 135–145)

## 2016-04-20 LAB — TROPONIN I: Troponin I: <0.03 ng/mL (ref <0.03)

## 2016-04-20 LAB — CBC
HEMATOCRIT: 35.9 % (ref 35.0–47.0)
Hemoglobin: 11.6 g/dL — ABNORMAL LOW (ref 12.0–16.0)
MCH: 22 pg — AB (ref 26.0–34.0)
MCHC: 32.4 g/dL (ref 32.0–36.0)
MCV: 67.9 fL — AB (ref 80.0–100.0)
PLATELETS: 226 10*3/uL (ref 150–440)
RBC: 5.28 MIL/uL — AB (ref 3.80–5.20)
RDW: 18.7 % — ABNORMAL HIGH (ref 11.5–14.5)
WBC: 12.1 10*3/uL — ABNORMAL HIGH (ref 3.6–11.0)

## 2016-04-20 NOTE — ED Triage Notes (Addendum)
Pt comes into the ED via POV c/o chest pain that started 30 minutes ago and it radiates into the left arm.  Patient denies dizziness but states she has a hard time taking a deep breath.  Patient has even and unlabored respirations.  Patient has known cardiac history.  Patient took 2 tab of 325mg  asp at home before coming.

## 2016-04-21 ENCOUNTER — Encounter: Payer: Self-pay | Admitting: Internal Medicine

## 2016-04-21 ENCOUNTER — Observation Stay: Payer: Managed Care, Other (non HMO)

## 2016-04-21 ENCOUNTER — Observation Stay
Admission: EM | Admit: 2016-04-21 | Discharge: 2016-04-21 | Disposition: A | Discharge status: Home / Self Care | Payer: Managed Care, Other (non HMO) | Attending: Internal Medicine | Admitting: Internal Medicine

## 2016-04-21 DIAGNOSIS — R079 Chest pain, unspecified: Secondary | ICD-10-CM | POA: Diagnosis present

## 2016-04-21 HISTORY — DX: Systemic lupus erythematosus, unspecified: M32.9

## 2016-04-21 HISTORY — DX: Atherosclerotic heart disease of native coronary artery without angina pectoris: I25.10

## 2016-04-21 LAB — GLUCOSE, CAPILLARY
Glucose-Capillary: 84 mg/dL (ref 65–99)
Glucose-Capillary: 74 mg/dL (ref 65–99)

## 2016-04-21 LAB — FIBRIN DERIVATIVES D-DIMER (ARMC ONLY): Fibrin derivatives D-dimer (ARMC): 362 (ref 0–499)

## 2016-04-21 LAB — TROPONIN I
Troponin I: <0.03 ng/mL (ref <0.03)
Troponin I: <0.03 ng/mL (ref <0.03)
Troponin I: <0.03 ng/mL (ref <0.03)

## 2016-04-21 LAB — INFLUENZA PANEL BY PCR (TYPE A & B)
INFLAPCR: NEGATIVE
INFLBPCR: NEGATIVE

## 2016-04-21 LAB — TSH: TSH: 1.655 u[IU]/mL (ref 0.350–4.500)

## 2016-04-21 MED ORDER — LORAZEPAM 2 MG/ML IJ SOLN
0.5000 mg | Freq: Once | INTRAMUSCULAR | Status: AC
  Administered 2016-04-21: 0.5 mg via INTRAVENOUS
  Filled 2016-04-21: qty 1

## 2016-04-21 MED ORDER — ENOXAPARIN SODIUM 120 MG/0.8ML ~~LOC~~ SOLN
1.0000 mg/kg | Freq: Two times a day (BID) | SUBCUTANEOUS | Status: DC
  Administered 2016-04-21: 105 mg via SUBCUTANEOUS
  Filled 2016-04-21 (×2): qty 0.8

## 2016-04-21 MED ORDER — MOMETASONE FURO-FORMOTEROL FUM 200-5 MCG/ACT IN AERO
2.0000 | INHALATION_SPRAY | Freq: Two times a day (BID) | RESPIRATORY_TRACT | Status: DC
  Administered 2016-04-21: 2 via RESPIRATORY_TRACT
  Filled 2016-04-21: qty 8.8

## 2016-04-21 MED ORDER — LISINOPRIL 20 MG PO TABS
40.0000 mg | ORAL_TABLET | Freq: Every day | ORAL | Status: DC
  Administered 2016-04-21: 40 mg via ORAL
  Filled 2016-04-21: qty 2

## 2016-04-21 MED ORDER — IBUPROFEN 400 MG PO TABS
600.0000 mg | ORAL_TABLET | Freq: Three times a day (TID) | ORAL | Status: DC
  Administered 2016-04-21: 600 mg via ORAL
  Filled 2016-04-21: qty 2

## 2016-04-21 MED ORDER — PRAVASTATIN SODIUM 20 MG PO TABS
20.0000 mg | ORAL_TABLET | Freq: Every day | ORAL | Status: DC
  Administered 2016-04-21: 20 mg via ORAL
  Filled 2016-04-21: qty 1

## 2016-04-21 MED ORDER — LORAZEPAM BOLUS VIA INFUSION: 0.5000 mg | Freq: Once | INTRAVENOUS | Status: DC

## 2016-04-21 MED ORDER — DOCUSATE SODIUM 100 MG PO CAPS: 100.0000 mg | ORAL_CAPSULE | Freq: Two times a day (BID) | ORAL | Status: DC

## 2016-04-21 MED ORDER — PANTOPRAZOLE SODIUM 40 MG PO TBEC
40.0000 mg | DELAYED_RELEASE_TABLET | Freq: Every day | ORAL | Status: DC
  Administered 2016-04-21: 40 mg via ORAL
  Filled 2016-04-21: qty 1

## 2016-04-21 MED ORDER — TECHNETIUM TC 99M DIETHYLENETRIAME-PENTAACETIC ACID
32.9500 | Freq: Once | INTRAVENOUS | Status: AC | PRN
  Administered 2016-04-21: 32.95 via INTRAVENOUS

## 2016-04-21 MED ORDER — AMLODIPINE BESYLATE 5 MG PO TABS
5.0000 mg | ORAL_TABLET | Freq: Every day | ORAL | Status: DC
  Administered 2016-04-21: 5 mg via ORAL
  Filled 2016-04-21: qty 1

## 2016-04-21 MED ORDER — INSULIN ASPART 100 UNIT/ML ~~LOC~~ SOLN: 0.0000 [IU] | Freq: Every day | SUBCUTANEOUS | Status: DC

## 2016-04-21 MED ORDER — TECHNETIUM TO 99M ALBUMIN AGGREGATED
4.0000 | Freq: Once | INTRAVENOUS | Status: AC | PRN
  Administered 2016-04-21: 4.36 via INTRAVENOUS

## 2016-04-21 MED ORDER — DICYCLOMINE HCL 20 MG PO TABS
20.0000 mg | ORAL_TABLET | Freq: Three times a day (TID) | ORAL | Status: DC | PRN
  Filled 2016-04-21: qty 1

## 2016-04-21 MED ORDER — METHOCARBAMOL 500 MG PO TABS
500.0000 mg | ORAL_TABLET | Freq: Two times a day (BID) | ORAL | Status: DC
  Administered 2016-04-21: 500 mg via ORAL
  Filled 2016-04-21 (×2): qty 1

## 2016-04-21 MED ORDER — IBUPROFEN 400 MG PO TABS
400.0000 mg | ORAL_TABLET | Freq: Once | ORAL | Status: AC
  Administered 2016-04-21: 400 mg via ORAL
  Filled 2016-04-21: qty 1

## 2016-04-21 MED ORDER — INSULIN ASPART 100 UNIT/ML ~~LOC~~ SOLN: 0.0000 [IU] | Freq: Three times a day (TID) | SUBCUTANEOUS | Status: DC

## 2016-04-21 MED ORDER — HYDROXYCHLOROQUINE SULFATE 200 MG PO TABS
400.0000 mg | ORAL_TABLET | Freq: Every day | ORAL | Status: DC
  Administered 2016-04-21: 400 mg via ORAL
  Filled 2016-04-21: qty 2

## 2016-04-21 MED ORDER — SODIUM CHLORIDE 0.9 % IV SOLN
INTRAVENOUS | Status: DC
  Administered 2016-04-21: 07:00:00 via INTRAVENOUS

## 2016-04-21 MED ORDER — NITROGLYCERIN 2 % TD OINT
0.5000 [in_us] | TOPICAL_OINTMENT | Freq: Four times a day (QID) | TRANSDERMAL | Status: DC
Start: 2016-04-21 — End: 2016-04-21
  Administered 2016-04-21: 0.5 [in_us] via TOPICAL
  Filled 2016-04-21: qty 1

## 2016-04-21 MED ORDER — DULOXETINE HCL 60 MG PO CPEP: 60.0000 mg | ORAL_CAPSULE | Freq: Every day | ORAL | Status: DC

## 2016-04-21 MED ORDER — ASPIRIN 81 MG PO CHEW
81.0000 mg | CHEWABLE_TABLET | Freq: Every day | ORAL | Status: DC
  Administered 2016-04-21: 81 mg via ORAL
  Filled 2016-04-21: qty 1

## 2016-04-21 MED ORDER — SODIUM CHLORIDE 0.9% FLUSH
3.0000 mL | Freq: Two times a day (BID) | INTRAVENOUS | Status: DC
  Administered 2016-04-21: 3 mL via INTRAVENOUS

## 2016-04-21 MED ORDER — IBUPROFEN 600 MG PO TABS: 600.0000 mg | ORAL_TABLET | Freq: Three times a day (TID) | ORAL | 0 refills | Status: DC

## 2016-04-21 MED ORDER — FENTANYL CITRATE (PF) 100 MCG/2ML IJ SOLN
25.0000 ug | Freq: Once | INTRAMUSCULAR | Status: AC
  Administered 2016-04-21: 25 ug via INTRAVENOUS
  Filled 2016-04-21: qty 2

## 2016-04-21 MED ORDER — ONDANSETRON HCL 4 MG/2ML IJ SOLN: 4.0000 mg | Freq: Four times a day (QID) | INTRAMUSCULAR | Status: DC | PRN

## 2016-04-21 MED ORDER — ISOSORBIDE MONONITRATE ER 60 MG PO TB24
60.0000 mg | ORAL_TABLET | Freq: Every day | ORAL | Status: DC
  Administered 2016-04-21: 60 mg via ORAL
  Filled 2016-04-21: qty 1

## 2016-04-21 MED ORDER — ISOSORBIDE DINITRATE 30 MG PO TABS
60.0000 mg | ORAL_TABLET | Freq: Every day | ORAL | Status: DC
  Filled 2016-04-21: qty 2

## 2016-04-21 MED ORDER — ONDANSETRON HCL 4 MG PO TABS
4.0000 mg | ORAL_TABLET | Freq: Four times a day (QID) | ORAL | Status: DC | PRN
Start: 2016-04-21 — End: 2016-04-21

## 2016-04-21 MED ORDER — ACETAMINOPHEN 325 MG PO TABS
650.0000 mg | ORAL_TABLET | Freq: Four times a day (QID) | ORAL | Status: DC | PRN
  Administered 2016-04-21: 650 mg via ORAL
  Filled 2016-04-21: qty 2

## 2016-04-21 MED ORDER — FLUOXETINE HCL 20 MG PO CAPS
40.0000 mg | ORAL_CAPSULE | Freq: Every day | ORAL | Status: DC
  Administered 2016-04-21: 40 mg via ORAL
  Filled 2016-04-21: qty 2

## 2016-04-21 MED ORDER — ACETAMINOPHEN 650 MG RE SUPP: 650.0000 mg | Freq: Four times a day (QID) | RECTAL | Status: DC | PRN

## 2016-04-21 MED ORDER — METOPROLOL SUCCINATE ER 100 MG PO TB24
100.0000 mg | ORAL_TABLET | Freq: Two times a day (BID) | ORAL | Status: DC
  Administered 2016-04-21: 100 mg via ORAL
  Filled 2016-04-21: qty 1

## 2016-04-21 NOTE — Progress Notes (Signed)
CBG 84 per [place]. Glucometer not synced yet.

## 2016-04-21 NOTE — Progress Notes (Signed)
Patient seen and evaluated this morning. Patient admitted for chest pain. Chest pain seems to be musculoskeletal. Follow-up on VQ scan.

## 2016-04-21 NOTE — Discharge Summary (Signed)
[facility] - [place] [facility]   PATIENT NAME: Wendy ShortenKari Woodward    MR#:  [id number]  DATE OF BIRTH:  05/01/1965  DATE OF ADMISSION:  04/21/2016 ADMITTING PHYSICIAN: Arnaldo NatalMichael S Diamond, MD  DATE OF DISCHARGE: 04/21/2016  PRIMARY CARE PHYSICIAN: Rayetta HumphreyGeorge, Sionne A, MD    ADMISSION DIAGNOSIS:  Chest pain, unspecified type [R07.9]  DISCHARGE DIAGNOSIS:  Active Problems:   Chest pain   SECONDARY DIAGNOSIS:   Past Medical History:  Diagnosis Date  . CAD (coronary artery disease)    per cardiac MRI  . Diabetes mellitus without complication (HCC)   . Hypertension   . Lupus (systemic lupus erythematosus) [facility](HCC)     HOSPITAL COURSE:   51 year old female with history of CAD recently diagnosed by cardiac MRI and lupus who presents with chest pain.  1. Musculoskeletal chest pain with significant tenderness on palpation: Patient was ruled out for acute myocardial infarction with negative troponins. Patient underwent V/Q scan which was low probability for PE. Due to significant pain on palpation the systolic to be muscular skeletal. It is recommended the patient take NSAIDs for 2-3 days.  2. CAD recently diagnosed on cardiac MRI: [facility]  3. History of lupus on Plaquenil  4. Diabetes type 2: Patient will continue on outpatient regimen  5. Essential hypertension: Patient will continue Norvasc, lisinopril and metoprolol  3 hyperlipidemia on statin therapy   DISCHARGE CONDITIONS AND DIET:  Stable for discharge on heart healthy diabetic diet  CONSULTS OBTAINED:  Treatment Team:  Dalia HeadingKenneth A Fath, MD  DRUG ALLERGIES:   Allergies  Allergen Reactions  . Flexeril [Cyclobenzaprine] Swelling  . Celecoxib Hives  . Gabapentin Hives  . Iodinated Diagnostic Agents Hives  . Morphine And Related Swelling  . Percocet [Oxycodone-Acetaminophen] Hives  . Skelaxin [Metaxalone] Hives  . Demerol [Meperidine] Hives  . Nortriptyline   . Tramadol  Hives  . Ketorolac Rash  . Norflex [Orphenadrine Citrate] Rash    DISCHARGE MEDICATIONS:   Current Discharge Medication List    START taking these medications   Details  ibuprofen (ADVIL,MOTRIN) 600 MG tablet Take 1 tablet (600 mg total) by mouth 3 (three) times daily. Qty: 30 tablet, Refills: 0      CONTINUE these medications which have NOT CHANGED   Details  albuterol (PROVENTIL HFA;VENTOLIN HFA) 108 (90 Base) MCG/ACT inhaler Inhale 1-2 puffs into the lungs every 6 (six) hours as needed for wheezing or shortness of breath.    amLODipine (NORVASC) 5 MG tablet Take 5 mg by mouth daily.    aspirin 81 MG tablet Take 81 mg by mouth daily.    budesonide-formoterol (SYMBICORT) 160-4.5 MCG/ACT inhaler Inhale 2 puffs into the lungs 2 (two) times daily.    esomeprazole (NEXIUM) 20 MG capsule Take 20 mg by mouth daily at 12 noon.    FLUoxetine (PROZAC) 40 MG capsule Take 40 mg by mouth daily.    hydroxychloroquine (PLAQUENIL) 200 MG tablet Take 400 mg by mouth.    isosorbide dinitrate (ISORDIL) 30 MG tablet Take 60 mg by mouth daily.    lisinopril (PRINIVIL,ZESTRIL) 20 MG tablet Take 40 mg by mouth daily.     metFORMIN (GLUCOPHAGE) 500 MG tablet Take by mouth 2 (two) times daily with a meal.    methocarbamol (ROBAXIN) 500 MG tablet Take 1 tablet (500 mg total) by mouth 2 (two) times daily. Qty: 20 tablet, Refills: 0    metoprolol succinate (TOPROL-XL) 100 MG 24 hr tablet Take 100 mg  by mouth 2 (two) times daily. Take with or immediately following a meal.    pravastatin (PRAVACHOL) 20 MG tablet Take 20 mg by mouth daily.    dicyclomine (BENTYL) 20 MG tablet Take 1 tablet (20 mg total) by mouth 3 (three) times daily as needed for spasms. Qty: 15 tablet, Refills: 0      STOP taking these medications     DULoxetine (CYMBALTA) 60 MG capsule               Today   CHIEF COMPLAINT:   Patient with chest pain worse with movement   VITAL SIGNS:  Blood pressure 140/89,  pulse 67, temperature 98.6 F (37 C), temperature source Oral, resp. rate 19, height 4\' 11"  (1.499 m), weight 105.2 kg (232 lb), last menstrual period 04/13/2016, SpO2 99 %.   REVIEW OF SYSTEMS:  Review of Systems  Constitutional: Negative.  Negative for chills, fever and malaise/fatigue.  HENT: Negative.  Negative for ear discharge, ear pain, hearing loss, nosebleeds and sore throat.   Eyes: Negative.  Negative for blurred vision and pain.  Respiratory: Negative.  Negative for cough, hemoptysis, shortness of breath and wheezing.   Cardiovascular: Positive for chest pain. Negative for palpitations and leg swelling.  Gastrointestinal: Negative.  Negative for abdominal pain, blood in stool, diarrhea, nausea and vomiting.  Genitourinary: Negative.  Negative for dysuria.  Musculoskeletal: Negative.  Negative for back pain.  Skin: Negative.   Neurological: Negative for dizziness, tremors, speech change, focal weakness, seizures and headaches.  Endo/Heme/Allergies: Negative.  Does not bruise/bleed easily.  Psychiatric/Behavioral: Negative for depression, hallucinations and suicidal ideas. The patient is nervous/anxious.      PHYSICAL EXAMINATION:  GENERAL:  51 y.o.-year-old patient lying in the bed with no acute distress.  NECK:  Supple, no jugular venous distention. No thyroid enlargement, no tenderness.  LUNGS: Normal breath sounds bilaterally, no wheezing, rales,rhonchi  No use of accessory muscles of respiration.  CARDIOVASCULAR: S1, S2 normal. No murmurs, rubs, or gallops. Significant chest wall tenderness ABDOMEN: Soft, non-tender, non-distended. Bowel sounds present. No organomegaly or mass.  EXTREMITIES: No pedal edema, cyanosis, or clubbing.  PSYCHIATRIC: The patient is alert and oriented x 3.  SKIN: No obvious rash, lesion, or ulcer.   DATA REVIEW:   CBC  Recent Labs Lab 04/20/16 2053  WBC 12.1*  HGB 11.6*  HCT 35.9  PLT 226    Chemistries   Recent Labs Lab  04/20/16 2053  NA 140  K 3.7  CL 109  CO2 24  GLUCOSE 91  BUN 15  CREATININE 0.92  CALCIUM 8.6*    Cardiac Enzymes  Recent Labs Lab 04/20/16 2053 04/21/16 0046 04/21/16 0759  TROPONINI <0.03 <0.03 <0.03    Microbiology Results  @[email or web address] @  RADIOLOGY:  Dg Chest 2 View  Result Date: 04/20/2016 CLINICAL DATA:  51 year old female with chest pain EXAM: CHEST  2 VIEW COMPARISON:  Chest radiograph dated 06/09/2015 FINDINGS: The heart size and mediastinal contours are within normal limits. Both lungs are clear. The visualized skeletal structures are unremarkable. IMPRESSION: No active cardiopulmonary disease. Electronically Signed   By: Elgie Collard M.D.   On: 04/20/2016 21:28      Management plans discussed with the patient and she is in agreement. Stable for discharge   Patient should follow up with pcp  CODE STATUS:     Code Status Orders        Start     Ordered   04/21/16 0556  Full code  Continuous  04/21/16 0555    Code Status History    Date Active Date Inactive Code Status Order ID Comments User Context   This patient has a current code status but no historical code status.      TOTAL TIME TAKING CARE OF THIS PATIENT: 37 minutes.    Note: This dictation was prepared with Dragon dictation along with smaller phrase technology. Any transcriptional errors that result from this process are unintentional.  Janie Capp M.D on 04/21/2016 at 11:40 AM  Between 7am to 6pm - Pager - [phone] After 6pm go to [email or web address] - password [email or web address]  [facility]  Office  [phone]  CC: Primary care physician; Rayetta Humphrey, MD

## 2016-04-21 NOTE — Progress Notes (Signed)
Patient complaining of chest pain, requesting fentanyl. Dr. Juliene PinaMody notified, does not order fentanyl on the floor. Patient has many allergies. Order for ibuprofen, patient states she is not allergic and will take it.

## 2016-04-21 NOTE — ED Provider Notes (Signed)
[facility] Emergency Department Provider Note   First MD Initiated Contact with Patient 04/21/16 0015     (approximate)  I have reviewed the triage vital signs and the nursing notes.   HISTORY  Chief Complaint Chest Pain    HPI [name] is a 51 y.o. female with below list of chronic medical conditions including CAD, valvular heart disease presents with chest pain with onset 30 minutes before presentation. Patient states her pain is in the central chest radiating to her left arm accompanied by dyspnea. Patient states her current pain score is 9 out of 10. Pain is worse with deep inspiration. Patient denies any lower extremity pain or swelling no history DVT or PE.   Past Medical History:  Diagnosis Date  . Diabetes mellitus without complication (HCC)   . Hypertension     There are no active problems to display for this patient.   Past Surgical History:  Procedure Laterality Date  . APPENDECTOMY    . CESAREAN SECTION    . KNEE ARTHROSCOPY    . TONSILLECTOMY      Prior to Admission medications   Medication Sig Start Date End Date Taking? Authorizing Provider  albuterol (PROVENTIL HFA;VENTOLIN HFA) 108 (90 Base) MCG/ACT inhaler Inhale 1-2 puffs into the lungs every 6 (six) hours as needed for wheezing or shortness of breath.   Yes Historical Provider, MD  amLODipine (NORVASC) 5 MG tablet Take 5 mg by mouth daily.   Yes Historical Provider, MD  aspirin 81 MG tablet Take 81 mg by mouth daily.   Yes Historical Provider, MD  budesonide-formoterol (SYMBICORT) 160-4.5 MCG/ACT inhaler Inhale 2 puffs into the lungs 2 (two) times daily. 01/14/16 01/13/17 Yes Historical Provider, MD  esomeprazole (NEXIUM) 20 MG capsule Take 20 mg by mouth daily at 12 noon.   Yes Historical Provider, MD  FLUoxetine (PROZAC) 40 MG capsule Take 40 mg by mouth daily.   Yes Historical Provider, MD  hydroxychloroquine (PLAQUENIL) 200 MG tablet Take 400 mg by mouth.   Yes  Historical Provider, MD  isosorbide dinitrate (ISORDIL) 30 MG tablet Take 60 mg by mouth daily.   Yes Historical Provider, MD  lisinopril (PRINIVIL,ZESTRIL) 20 MG tablet Take 40 mg by mouth daily.    Yes Historical Provider, MD  metFORMIN (GLUCOPHAGE) 500 MG tablet Take by mouth 2 (two) times daily with a meal.   Yes Historical Provider, MD  methocarbamol (ROBAXIN) 500 MG tablet Take 1 tablet (500 mg total) by mouth 2 (two) times daily. 01/06/15  Yes Payton Mccallumrlando Conty, MD  metoprolol succinate (TOPROL-XL) 100 MG 24 hr tablet Take 100 mg by mouth 2 (two) times daily. Take with or immediately following a meal.   Yes Historical Provider, MD  pravastatin (PRAVACHOL) 20 MG tablet Take 20 mg by mouth daily.   Yes Historical Provider, MD  dicyclomine (BENTYL) 20 MG tablet Take 1 tablet (20 mg total) by mouth 3 (three) times daily as needed for spasms. Patient not taking: Reported on 04/21/2016 05/14/15 05/13/16  Myrna Blazeravid Matthew Schaevitz, MD  DULoxetine (CYMBALTA) 60 MG capsule Take 60 mg by mouth daily.    Historical Provider, MD    Allergies Flexeril [cyclobenzaprine]; Celecoxib; Gabapentin; Iodinated diagnostic agents; Morphine and related; Percocet [oxycodone-acetaminophen]; Skelaxin [metaxalone]; Demerol [meperidine]; Nortriptyline; Tramadol; Ketorolac; and Norflex [orphenadrine citrate]  Family History  Problem Relation Age of Onset  . Hyperlipidemia Mother   . Hypertension Father   . Diabetes Father   . Hyperlipidemia Father     Social  History Social History  Substance Use Topics  . Smoking status: Former Smoker    Packs/day: 0.50    Years: 15.00    Types: Cigarettes  . Smokeless tobacco: Never Used  . Alcohol use No    Review of Systems Constitutional: No fever/chills Eyes: No visual changes. ENT: No sore throat. Cardiovascular: Positive for chest pain. Respiratory: Positive for shortness of breath. Gastrointestinal: No abdominal pain.  No nausea, no vomiting.  No diarrhea.  No  constipation. Genitourinary: Negative for dysuria. Musculoskeletal: Negative for back pain. Skin: Negative for rash. Neurological: Negative for headaches, focal weakness or numbness.  10-point ROS otherwise negative.  ____________________________________________   PHYSICAL EXAM:  VITAL SIGNS: ED Triage Vitals  Enc Vitals Group     BP 04/20/16 2053 (!) 165/65     Pulse Rate 04/20/16 2051 69     Resp 04/20/16 2051 18     Temp 04/20/16 2051 97.9 F (36.6 C)     Temp Source 04/20/16 2051 Oral     SpO2 04/20/16 2051 100 %     Weight 04/20/16 2053 232 lb (105.2 kg)     Height 04/20/16 2053 4\' 11"  (1.499 m)     Head Circumference --      Peak Flow --      Pain Score 04/20/16 2053 10     Pain Loc --      Pain Edu? --      Excl. in GC? --     Constitutional: Alert and oriented. Well appearing and in no acute distress. Eyes: Conjunctivae are normal. PERRL. EOMI. Head: Atraumatic. Mouth/Throat: Mucous membranes are moist.  Oropharynx non-erythematous. Neck: No stridor.  Cardiovascular: Normal rate, regular rhythm. Good peripheral circulation. Diastolic murmur noted Respiratory: Normal respiratory effort.  No retractions. Lungs CTAB. Gastrointestinal: Soft and nontender. No distention.  Musculoskeletal: No lower extremity tenderness nor edema. No gross deformities of extremities. Neurologic:  Normal speech and language. No gross focal neurologic deficits are appreciated.  Skin:  Skin is warm, dry and intact. No rash noted. Psychiatric: Mood and affect are normal. Speech and behavior are normal.  ____________________________________________   LABS (all labs ordered are listed, but only abnormal results are displayed)  Labs Reviewed  BASIC METABOLIC PANEL - Abnormal; Notable for the following:       Result Value   Calcium 8.6 (*)    All other components within normal limits  CBC - Abnormal; Notable for the following:    WBC 12.1 (*)    RBC 5.28 (*)    Hemoglobin 11.6 (*)     MCV 67.9 (*)    MCH 22.0 (*)    RDW 18.7 (*)    All other components within normal limits  TROPONIN I  TROPONIN I  FIBRIN DERIVATIVES D-DIMER ([facility] ONLY)   ____________________________________________  EKG  ED ECG REPORT I, [place] N BROWN, the attending physician, personally viewed and interpreted this ECG.   Date: 04/21/2016  EKG Time: 8:47 PM  Rate: 68  Rhythm: Normal sinus rhythm  Axis: Normal  Intervals: Normal  ST&T Change: None  ____________________________________________  RADIOLOGY I, [place] N BROWN, personally viewed and evaluated these images (plain radiographs) as part of my medical decision making, as well as reviewing the written report by the radiologist.  Dg Chest 2 View  Result Date: 04/20/2016 CLINICAL DATA:  51 year old female with chest pain EXAM: CHEST  2 VIEW COMPARISON:  Chest radiograph dated 06/09/2015 FINDINGS: The heart size and mediastinal contours are within normal limits. Both  lungs are clear. The visualized skeletal structures are unremarkable. IMPRESSION: No active cardiopulmonary disease. Electronically Signed   By: Elgie Collard M.D.   On: 04/20/2016 21:28      Procedures    INITIAL IMPRESSION / ASSESSMENT AND PLAN / ED COURSE  Pertinent labs & imaging results that were available during my care of the patient were reviewed by me and considered in my medical decision making (see chart for details).  51 year old female presenting to the emergency department with central chest discomfort radiation to left arm EKG revealed no evidence of ischemia or infarction. Troponin negative d-dimer likewise 362. However given ongoing chest pain concerning for potential cardiac etiology or possibly pulmonary emboli (CT scan performed secondary to contrast allergy) as such patient discussed with Dr. Sheryle Hail for hospital admission for further evaluation and management      ____________________________________________  FINAL CLINICAL  IMPRESSION(S) / ED DIAGNOSES  Final diagnoses:  Chest pain, unspecified type     MEDICATIONS GIVEN DURING THIS VISIT:  Medications  fentaNYL (SUBLIMAZE) injection 25 mcg (25 mcg Intravenous Given 04/21/16 0125)     NEW OUTPATIENT MEDICATIONS STARTED DURING THIS VISIT:  New Prescriptions   No medications on file    Modified Medications   No medications on file    Discontinued Medications   AZITHROMYCIN (ZITHROMAX Z-PAK) 250 MG TABLET    Use as instructed on box.   HYDROCODONE-ACETAMINOPHEN (NORCO/VICODIN) 5-325 MG TABLET    Take 1-2 tablets by mouth every 6 (six) hours as needed.   HYDROCODONE-ACETAMINOPHEN (NORCO/VICODIN) 5-325 MG TABLET    Take 1 tablet by mouth every 6 (six) hours as needed for moderate pain.   KETOROLAC (TORADOL) 10 MG TABLET    Take 1 tablet (10 mg total) by mouth every 8 (eight) hours.   ONDANSETRON (ZOFRAN ODT) 4 MG DISINTEGRATING TABLET    Take 1 tablet (4 mg total) by mouth every 8 (eight) hours as needed for nausea or vomiting.   ONDANSETRON (ZOFRAN) 4 MG TABLET    Take 1 tablet (4 mg total) by mouth daily as needed.   ORPHENADRINE (NORFLEX) 100 MG TABLET    Take 1 tablet (100 mg total) by mouth 2 (two) times daily.   PREDNISONE (DELTASONE) 20 MG TABLET    Take 1 tablet (20 mg total) by mouth daily.     Note:  This document was prepared using Dragon voice recognition software and may include unintentional dictation errors.    Darci Current, MD 04/21/16 [phone]

## 2016-04-21 NOTE — Progress Notes (Signed)
A&O. Up with standby assist. Admitted with chest pain. In no apparent distress, on RA. Sleeping quietly in room with husband at side. IV fluids infusing. Lovenox injection given. Call bell in reach, bed in low position.

## 2016-04-21 NOTE — Progress Notes (Signed)
Per Dr. Juliene PinaMody. Pt can have diet after her VQ scan. Can be discharged if negative.

## 2016-04-21 NOTE — Consult Note (Signed)
[facility]  CARDIOLOGY CONSULT NOTE  Patient ID: Wendy ShortenKari Woodward MRN: [id number] DOB/AGE: 49/06/1965 49 y.o.  Admit date: 04/21/2016 Referring Physician Dr. Juliene PinaMody Primary Physician   Primary Cardiologist   Reason for Consultation chest pain  HPI: Patient is a 51 year old female with history of coronary artery disease with a documented occluded obtuse marginal branch by cardiac catheterization in 2012. She has had intermittent episodes of chest pain since that time. Due to her chest pain she underwent cardiac stress MRA and MRI earlier this month which revealed evidence of an infarction in the distribution of her known OM occlusion with no other reversible disease. She is ruled out for myocardial infarction. Electrocardiogram with no ischemia. VQ scan is pending.  Review of Systems  Constitutional: Negative.   HENT: Negative.   Eyes: Negative.   Respiratory: Negative.   Cardiovascular: Positive for chest pain.  Gastrointestinal: Negative.   Genitourinary: Negative.   Musculoskeletal: Negative.   Skin: Negative.   Neurological: Negative.   Endo/Heme/Allergies: Negative.   Psychiatric/Behavioral: Negative.     Past Medical History:  Diagnosis Date  . CAD (coronary artery disease)    per cardiac MRI  . Diabetes mellitus without complication (HCC)   . Hypertension   . Lupus (systemic lupus erythematosus) (HCC)     Family History  Problem Relation Age of Onset  . Hyperlipidemia Mother   . Hypertension Father   . Diabetes Father   . Hyperlipidemia Father     Social History   Social History  . Marital status: Single    Spouse name: N/A  . Number of children: N/A  . Years of education: N/A   Occupational History  . Not on file.   Social History Main Topics  . Smoking status: Former Smoker    Packs/day: 0.50    Years: 15.00    Types: Cigarettes  . Smokeless tobacco: Never Used  . Alcohol use No  . Drug use: No  . Sexual activity:  Not on file   Other Topics Concern  . Not on file   Social History Narrative  . No narrative on file    Past Surgical History:  Procedure Laterality Date  . APPENDECTOMY    . CESAREAN SECTION    . KNEE ARTHROSCOPY    . TONSILLECTOMY       No prescriptions prior to admission.    Physical Exam: Blood pressure 137/74, pulse 68, temperature 98.1 F (36.7 C), temperature source Oral, resp. rate 19, height 4\' 11"  (1.499 m), weight 232 lb (105.2 kg), last menstrual period 04/13/2016, SpO2 99 %.   Wt Readings from Last 1 Encounters:  04/20/16 232 lb (105.2 kg)     General appearance: alert and cooperative Resp: clear to auscultation bilaterally Cardio: regular rate and rhythm GI: soft, non-tender; bowel sounds normal; no masses,  no organomegaly Extremities: extremities normal, atraumatic, no cyanosis or edema Neurologic: Grossly normal  Labs:   Lab Results  Component Value Date   WBC 12.1 (H) 04/20/2016   HGB 11.6 (L) 04/20/2016   HCT 35.9 04/20/2016   MCV 67.9 (L) 04/20/2016   PLT 226 04/20/2016    Recent Labs Lab 04/20/16 2053  NA 140  K 3.7  CL 109  CO2 24  BUN 15  CREATININE 0.92  CALCIUM 8.6*  GLUCOSE 91   Lab Results  Component Value Date   TROPONINI <0.03 04/21/2016      Radiology: No acute cardiopulmonary disease EKG: No ischemia. Normal sinus rhythm  ASSESSMENT AND PLAN:    Patient is a 51 year old female with history of chronically occluded OM by catheter in 2012. She was admitted with chest pain and ruled out for myocardial infarction. Had a history of a recent evaluation with cardiac MRI and MRA with evidence of infarction in the distribution of the OM vessel but no reversible ischemia. She does not appear to have any progression of coronary disease in her chest pain does not appear to be ischemic. It appears that her pain is from alternative etiology. VQ scan is pending. Would proceed with this as planned. No further cardiac workup indicated  during this hospitalization. Signed: Dalia Heading MD, [facility] 04/21/2016, 4:46 PM

## 2016-04-21 NOTE — Plan of Care (Signed)
Problem: Pain Managment: Goal: General experience of comfort will improve Outcome: Not Progressing Patient still complaining of chest pain.

## 2016-04-21 NOTE — H&P (Signed)
[name] is an 51 y.o. female.   Chief Complaint: Chest pain HPI: The patient with past medical history of coronary artery disease, diabetes and newly diagnosed lupus presents emergency department complaining of chest pain. The pain began at rest and seemed to radiate into her left arm, notably at only 2 locations: Her medial wrist as well as medial epicondyle of the elbow. She denies nausea, vomiting or diaphoresis but admits to a heaviness in her chest. She denies being unable to catch her breath but admits to pain with deep inspiration. In the emergency department the patient's initial troponin was negative and EKG showed no indication of acute ischemia. The patient has a contrast allergy thus could not undergo CTA of the chest to rule out pulmonary embolism. Despite pain medicine her chest continued to hurt which prompted the emergency department staff to call for admission.  Past Medical History:  Diagnosis Date  . CAD (coronary artery disease)    per cardiac MRI  . Diabetes mellitus without complication ([place])   . Hypertension   . Lupus (systemic lupus erythematosus) ([place])     Past Surgical History:  Procedure Laterality Date  . APPENDECTOMY    . CESAREAN SECTION    . KNEE ARTHROSCOPY    . TONSILLECTOMY      Family History  Problem Relation Age of Onset  . Hyperlipidemia Mother   . Hypertension Father   . Diabetes Father   . Hyperlipidemia Father    Social History:  reports that she has quit smoking. Her smoking use included Cigarettes. She has a 7.50 pack-year smoking history. She has never used smokeless tobacco. She reports that she does not drink alcohol or use drugs.  Allergies:  Allergies  Allergen Reactions  . Flexeril [Cyclobenzaprine] Swelling  . Celecoxib Hives  . Gabapentin Hives  . Iodinated Diagnostic Agents Hives  . Morphine And Related Swelling  . Percocet [Oxycodone-Acetaminophen] Hives  . Skelaxin [Metaxalone] Hives  . Demerol [Meperidine] Hives  .  Nortriptyline   . Tramadol Hives  . Ketorolac Rash  . Norflex [Orphenadrine Citrate] Rash    Medications Prior to Admission  Medication Sig Dispense Refill  . albuterol (PROVENTIL HFA;VENTOLIN HFA) 108 (90 Base) MCG/ACT inhaler Inhale 1-2 puffs into the lungs every 6 (six) hours as needed for wheezing or shortness of breath.    Marland Kitchen amLODipine (NORVASC) 5 MG tablet Take 5 mg by mouth daily.    Marland Kitchen aspirin 81 MG tablet Take 81 mg by mouth daily.    . budesonide-formoterol (SYMBICORT) 160-4.5 MCG/ACT inhaler Inhale 2 puffs into the lungs 2 (two) times daily.    Marland Kitchen esomeprazole (NEXIUM) 20 MG capsule Take 20 mg by mouth daily at 12 noon.    Marland Kitchen FLUoxetine (PROZAC) 40 MG capsule Take 40 mg by mouth daily.    . hydroxychloroquine (PLAQUENIL) 200 MG tablet Take 400 mg by mouth.    . isosorbide dinitrate (ISORDIL) 30 MG tablet Take 60 mg by mouth daily.    Marland Kitchen lisinopril (PRINIVIL,ZESTRIL) 20 MG tablet Take 40 mg by mouth daily.     . metFORMIN (GLUCOPHAGE) 500 MG tablet Take by mouth 2 (two) times daily with a meal.    . methocarbamol (ROBAXIN) 500 MG tablet Take 1 tablet (500 mg total) by mouth 2 (two) times daily. 20 tablet 0  . metoprolol succinate (TOPROL-XL) 100 MG 24 hr tablet Take 100 mg by mouth 2 (two) times daily. Take with or immediately following a meal.    . pravastatin (PRAVACHOL)  20 MG tablet Take 20 mg by mouth daily.    Marland Kitchen dicyclomine (BENTYL) 20 MG tablet Take 1 tablet (20 mg total) by mouth 3 (three) times daily as needed for spasms. (Patient not taking: Reported on 04/21/2016) 15 tablet 0  . DULoxetine (CYMBALTA) 60 MG capsule Take 60 mg by mouth daily.      Results for orders placed or performed during the hospital encounter of 04/21/16 (from the past 48 hour(s))  Basic metabolic panel     Status: Abnormal   Collection Time: 04/20/16  8:53 PM  Result Value Ref Range   Sodium 140 135 - 145 mmol/L   Potassium 3.7 3.5 - 5.1 mmol/L   Chloride 109 101 - 111 mmol/L   CO2 24 22 - 32  mmol/L   Glucose, Bld 91 65 - 99 mg/dL   BUN 15 6 - 20 mg/dL   Creatinine, Ser 0.92 0.44 - 1.00 mg/dL   Calcium 8.6 (L) 8.9 - 10.3 mg/dL   GFR calc non Af Amer >60 >60 mL/min   GFR calc Af Amer >60 >60 mL/min    Comment: (NOTE) The eGFR has been calculated using the CKD EPI equation. This calculation has not been validated in all clinical situations. eGFR's persistently <60 mL/min signify possible Chronic Kidney Disease.    Anion gap 7 5 - 15  CBC     Status: Abnormal   Collection Time: 04/20/16  8:53 PM  Result Value Ref Range   WBC 12.1 (H) 3.6 - 11.0 K/uL   RBC 5.28 (H) 3.80 - 5.20 MIL/uL   Hemoglobin 11.6 (L) 12.0 - 16.0 g/dL   HCT 35.9 35.0 - 47.0 %   MCV 67.9 (L) 80.0 - 100.0 fL   MCH 22.0 (L) 26.0 - 34.0 pg   MCHC 32.4 32.0 - 36.0 g/dL   RDW 18.7 (H) 11.5 - 14.5 %   Platelets 226 150 - 440 K/uL  Troponin I     Status: None   Collection Time: 04/20/16  8:53 PM  Result Value Ref Range   Troponin I <0.03 <0.03 ng/mL  Troponin I     Status: None   Collection Time: 04/21/16 12:46 AM  Result Value Ref Range   Troponin I <0.03 <0.03 ng/mL  Fibrin derivatives D-Dimer     Status: None   Collection Time: 04/21/16 12:46 AM  Result Value Ref Range   Fibrin derivatives D-dimer ([facility]) 362 0 - 499    Comment: <> Exclusion of Venous Thromboembolism (VTE) - OUTPATIENTS ONLY        (Emergency Department or [place])             0-499 ng/ml (FEU)  : With a low to intermediate pretest                                        probability for VTE this test result                                        excludes the diagnosis of VTE.           > 499 ng/ml (FEU)  : VTE not excluded.  Additional work up  for VTE is required.   <>  Testing on Inpatients and Evaluation of Disseminated Intravascular        Coagulation ([facility]IC)             Reference Range:   0-499 ng/ml (FEU)    Dg Chest 2 View  Result Date: 04/20/2016 CLINICAL DATA:  51 year old female with  chest pain EXAM: CHEST  2 VIEW COMPARISON:  Chest radiograph dated 06/09/2015 FINDINGS: The heart size and mediastinal contours are within normal limits. Both lungs are clear. The visualized skeletal structures are unremarkable. IMPRESSION: No active cardiopulmonary disease. Electronically Signed   By: Anner Crete M.D.   On: 04/20/2016 21:28    Review of Systems  Constitutional: Negative for chills and fever.  HENT: Negative for sore throat and tinnitus.   Eyes: Negative for blurred vision and redness.  Respiratory: Positive for shortness of breath ("heaviness"). Negative for cough.   Cardiovascular: Positive for chest pain. Negative for palpitations, orthopnea and PND.  Gastrointestinal: Negative for abdominal pain, diarrhea, nausea and vomiting.  Genitourinary: Negative for dysuria, frequency and urgency.  Musculoskeletal: Negative for joint pain and myalgias.  Skin: Negative for rash.       No lesions  Neurological: Negative for speech change, focal weakness and weakness.  Endo/Heme/Allergies: Does not bruise/bleed easily.       No temperature intolerance  Psychiatric/Behavioral: Negative for depression and suicidal ideas.    Blood pressure (!) 152/83, pulse 66, temperature 97.9 F (36.6 C), temperature source Oral, resp. rate 17, height _0  (1.499 m), weight 105.2 kg (232 lb), last menstrual period 04/13/2016, SpO2 99 %. Physical Exam  Vitals reviewed. Constitutional: She is oriented to person, place, and time. She appears well-developed and well-nourished. No distress.  HENT:  Head: Normocephalic and atraumatic.  Mouth/Throat: Oropharynx is clear and moist.  Eyes: Conjunctivae and EOM are normal. Pupils are equal, round, and reactive to light. No scleral icterus.  Neck: Normal range of motion. Neck supple. No JVD present. No tracheal deviation present. No thyromegaly present.  Cardiovascular: Normal rate and regular rhythm.  Exam reveals no gallop and no friction rub.    Murmur heard.  Systolic murmur is present with a grade of 3/6  Respiratory: Effort normal and breath sounds normal.  GI: Soft. Bowel sounds are normal. She exhibits no distension. There is no tenderness.  Genitourinary:  Genitourinary Comments: Deferred  Musculoskeletal: Normal range of motion. She exhibits no edema.  Lymphadenopathy:    She has no cervical adenopathy.  Neurological: She is alert and oriented to person, place, and time. No cranial nerve deficit. She exhibits normal muscle tone.  Skin: Skin is warm and dry. No rash noted. No erythema.  Psychiatric: She has a normal mood and affect. Her behavior is normal. Judgment and thought content normal.     Assessment/Plan This is a 51 year old female admitted for chest pain. 1. Chest pain: Unlikely coronary ischemia as no data to support this diagnosis at this time. However, the patient does have symptoms concerning for possible pulmonary embolism. D-dimer is not significantly elevated although it is higher than usual (this could be explained by lupus however). We will obtain a VQ scan in the morning. The patient is nothing by mouth. I have started her on therapeutic Lovenox. Continue to follow cardiac enzymes. Monitor telemetry 2. Coronary artery disease: Diagnosed by recent cardiac MRI (previous infarcts noted in obtuse branch with excellent viability throughout heart muscle). Discovered during preop clearance that prompted further testing due to the  patient's systolic murmur. Imaging shows increased velocities in left ventricular outflow tract without subaortic narrowing. Continue aspirin. 3. SLE: No arrhythmias noted at this time. Continue Plaquenil. 4. Diabetes mellitus type 2: Sliding scale insulin hospitalized 5. Essential hypertension: Controlled; Continue amlodipine, lisinopril and metoprolol 6. Lipidemia: Continue statin therapy 7. DVT prophylaxis: Full dose anticoagulation as above 8. GI prophylaxis: Pantoprazole per home  regimen The patient is a full code. Time spent on admission orders and patient care approximately 45 minutes  Harrie Foreman, MD 04/21/2016, 5:55 AM

## 2016-04-21 NOTE — ED Notes (Signed)
Report given to Schering-PloughCrystal, RN 2A

## 2016-04-21 NOTE — Progress Notes (Signed)
Patient given discharge teaching and paperwork regarding medications, diet, follow-up appointments and activity. Patient understanding verbalized. No complaints at this time. . IV and telemetry discontinued prior to leaving. Skin assessment as previously charted and vitals are stable; on room air. Patient being discharged to home. 

## 2016-04-22 LAB — HEMOGLOBIN A1C
Hgb A1c MFr Bld: 5.4 % (ref 4.8–5.6)
Mean Plasma Glucose: 108 mg/dL

## 2016-06-27 DIAGNOSIS — F411 Generalized anxiety disorder: Secondary | ICD-10-CM | POA: Insufficient documentation

## 2016-07-02 DIAGNOSIS — I4891 Unspecified atrial fibrillation: Secondary | ICD-10-CM | POA: Insufficient documentation

## 2016-07-12 DIAGNOSIS — Z952 Presence of prosthetic heart valve: Secondary | ICD-10-CM | POA: Insufficient documentation

## 2016-09-12 ENCOUNTER — Ambulatory Visit: Payer: Managed Care, Other (non HMO)

## 2016-09-19 ENCOUNTER — Encounter: Payer: Managed Care, Other (non HMO) | Attending: Internal Medicine | Admitting: *Deleted

## 2016-09-19 ENCOUNTER — Encounter: Payer: Self-pay | Admitting: *Deleted

## 2016-09-19 VITALS — Ht 60.0 in | Wt 241.9 lb

## 2016-09-19 DIAGNOSIS — Z954 Presence of other heart-valve replacement: Secondary | ICD-10-CM | POA: Diagnosis present

## 2016-09-19 DIAGNOSIS — I10 Essential (primary) hypertension: Secondary | ICD-10-CM | POA: Insufficient documentation

## 2016-09-19 DIAGNOSIS — Z87891 Personal history of nicotine dependence: Secondary | ICD-10-CM | POA: Insufficient documentation

## 2016-09-19 DIAGNOSIS — E119 Type 2 diabetes mellitus without complications: Secondary | ICD-10-CM | POA: Diagnosis not present

## 2016-09-19 DIAGNOSIS — M329 Systemic lupus erythematosus, unspecified: Secondary | ICD-10-CM | POA: Insufficient documentation

## 2016-09-19 DIAGNOSIS — I2510 Atherosclerotic heart disease of native coronary artery without angina pectoris: Secondary | ICD-10-CM | POA: Insufficient documentation

## 2016-09-19 DIAGNOSIS — Z952 Presence of prosthetic heart valve: Secondary | ICD-10-CM

## 2016-09-19 NOTE — Progress Notes (Signed)
Daily Session Note  Patient Details  Name: [name] MRN: [id number] Date of Birth: 10/20/1965 Referring Provider:    Encounter Date: 09/19/2016  Check In:     Session Check In - 09/19/16 1212      Check-In   Location [facility]   Staff Present Heath Lark, RN, BSN, CCRP;Shatara Stanek, RN, Levie Heritage, MA, ACSM RCEP, Exercise Physiologist   Supervising physician immediately available to respond to emergencies See telemetry face sheet for immediately available ER MD   Medication changes reported     No   Fall or balance concerns reported    Yes         History  Smoking Status  . Former Smoker  . Packs/day: 0.50  . Years: 15.00  . Types: Cigarettes  . Quit date: 06/26/2016  Smokeless Tobacco  . Never Used    Goals Met:  Proper associated with RPD/PD & O2 Sat Personal goals reviewed No report of cardiac concerns or symptoms Strength training completed today  Goals Unmet:  Not Applicable  Comments:     Dr. Emily Filbert is Medical Director for [place] and [facility].

## 2016-09-19 NOTE — Progress Notes (Signed)
Cardiac Individual Treatment Plan  Patient Details  Name: [name] MRN: [id number] Date of Birth: 11/13/1965 Referring Provider:     [facility]  Referring Provider  Ward, Cary MD      Initial Encounter Date:    [facility]  Date  09/19/16  Referring Provider  Ward, Georga Hackingary MD      Visit Diagnosis: Aortic valve replaced  Patient's Home Medications on Admission:  Current Outpatient Prescriptions:  .  acetaminophen-codeine (TYLENOL #3) 300-30 MG tablet, TAKE 1-2 TABLET EVERY 8HOURS AS NEEDED FOR PAIN, MAX 4 TABS/DAY, Disp: , Rfl:  .  albuterol (PROVENTIL HFA;VENTOLIN HFA) 108 (90 Base) MCG/ACT inhaler, Inhale 1-2 puffs into the lungs every 6 (six) hours as needed for wheezing or shortness of breath., Disp: , Rfl:  .  aspirin 81 MG tablet, Take 81 mg by mouth daily., Disp: , Rfl:  .  budesonide-formoterol (SYMBICORT) 160-4.5 MCG/ACT inhaler, Inhale 2 puffs into the lungs 2 (two) times daily., Disp: , Rfl:  .  budesonide-formoterol (SYMBICORT) 160-4.5 MCG/ACT inhaler, Inhale into the lungs., Disp: , Rfl:  .  esomeprazole (NEXIUM) 20 MG capsule, Take 20 mg by mouth daily at 12 noon., Disp: , Rfl:  .  FLUoxetine (PROZAC) 40 MG capsule, Take 40 mg by mouth daily., Disp: , Rfl:  .  furosemide (LASIX) 40 MG tablet, Take by mouth., Disp: , Rfl:  .  hydroxychloroquine (PLAQUENIL) 200 MG tablet, Take 400 mg by mouth., Disp: , Rfl:  .  metFORMIN (GLUCOPHAGE) 500 MG tablet, Take by mouth 2 (two) times daily with a meal., Disp: , Rfl:  .  methocarbamol (ROBAXIN) 500 MG tablet, Take 1 tablet (500 mg total) by mouth 2 (two) times daily., Disp: 20 tablet, Rfl: 0 .  metoprolol succinate (TOPROL-XL) 100 MG 24 hr tablet, Take 25 mg by mouth 2 (two) times daily. Take with or immediately following a meal. , Disp: , Rfl:  .  Potassium 99 MG TABS, Take by mouth., Disp: , Rfl:  .  pravastatin  (PRAVACHOL) 20 MG tablet, Take 20 mg by mouth daily., Disp: , Rfl:  .  amLODipine (NORVASC) 5 MG tablet, Take 5 mg by mouth daily., Disp: , Rfl:  .  dicyclomine (BENTYL) 20 MG tablet, Take 1 tablet (20 mg total) by mouth 3 (three) times daily as needed for spasms. (Patient not taking: Reported on 04/21/2016), Disp: 15 tablet, Rfl: 0 .  ibuprofen (ADVIL,MOTRIN) 600 MG tablet, Take 1 tablet (600 mg total) by mouth 3 (three) times daily. (Patient not taking: Reported on 09/19/2016), Disp: 30 tablet, Rfl: 0 .  isosorbide dinitrate (ISORDIL) 30 MG tablet, Take 60 mg by mouth daily., Disp: , Rfl:  .  lisinopril (PRINIVIL,ZESTRIL) 20 MG tablet, Take 40 mg by mouth daily. , Disp: , Rfl:   Past Medical History: Past Medical History:  Diagnosis Date  . CAD (coronary artery disease)    per cardiac MRI  . Diabetes mellitus without complication (HCC)   . Hypertension   . Lupus (systemic lupus erythematosus) (HCC)     Tobacco Use: History  Smoking Status  . Former Smoker  . Packs/day: 0.50  . Years: 15.00  . Types: Cigarettes  . Quit date: 06/26/2016  Smokeless Tobacco  . Never Used    Labs: Recent Review Flowsheet Data    Labs for ITP Cardiac and Pulmonary Rehab Latest Ref Rng & Units 04/21/2016   Hemoglobin A1c  4.8 - 5.6 % 5.4       Exercise Target Goals: Date: 09/19/16  Exercise Program Goal: Individual exercise prescription set with THRR, safety & activity barriers. Participant demonstrates ability to understand and report RPE using BORG scale, to self-measure pulse accurately, and to acknowledge the importance of the exercise prescription.  Exercise Prescription Goal: Starting with aerobic activity 30 plus minutes a day, 3 days per week for initial exercise prescription. Provide home exercise prescription and guidelines that participant acknowledges understanding prior to discharge.  Activity Barriers & Risk Stratification:     Activity Barriers & Cardiac Risk Stratification -  09/19/16 1217      Activity Barriers & Cardiac Risk Stratification   Activity Barriers Arthritis;Fibromyalgia;Shortness of Breath;Back Problems;Joint Problems;Deconditioning;Muscular Weakness;Balance Concerns;History of Falls  spinal stenosis, r knee meniscus tear, lupus, OA, RA   Cardiac Risk Stratification High      6 Minute Walk:     6 Minute Walk    Row Name 09/19/16 1425         6 Minute Walk   Phase Initial     Distance 1075 feet     Walk Time 6 minutes     # of Rest Breaks 0     MPH 2.04     METS 2.8     RPE 12     Perceived Dyspnea  1     VO2 Peak 9.82     Symptoms Yes (comment)     Comments knee pain 8/10 slightly SOB     Resting HR 73 bpm     Resting BP 140/70     Max Ex. HR 111 bpm     Max Ex. BP 164/74     2 Minute Post BP 126/72        Oxygen Initial Assessment:   Oxygen Re-Evaluation:   Oxygen Discharge (Final Oxygen Re-Evaluation):   Initial Exercise Prescription:     Initial Exercise Prescription - 09/19/16 1400      Date of Initial Exercise RX and Referring Provider   Date 09/19/16   Referring Provider Ward, Georga Hacking MD     Treadmill   MPH 1.7   Grade 0.5   Minutes 15   METs 2.42     NuStep   Level 2   SPM 80   Minutes 15   METs 2.5     Biostep-RELP   Level 2   SPM 50   Minutes 15   METs 2     Prescription Details   Frequency (times per week) 3   Duration Progress to 45 minutes of aerobic exercise without signs/symptoms of physical distress     Intensity   THRR 40-80% of Max Heartrate 112-151   Ratings of Perceived Exertion 11-15   Perceived Dyspnea 0-4     Progression   Progression Continue to progress workloads to maintain intensity without signs/symptoms of physical distress.     Resistance Training   Training Prescription Yes   Weight 3 lbs   Reps 10-15      Perform Capillary Blood Glucose checks as needed.  Exercise Prescription Changes:      Exercise Prescription Changes    Row Name 09/19/16 1400              Response to Exercise   Blood Pressure (Admit) 140/70       Blood Pressure (Exercise) 144/74       Blood Pressure (Exit) 126/72       Heart Rate (Admit) 73 bpm  Heart Rate (Exercise) 111 bpm       Heart Rate (Exit) 78 bpm       Oxygen Saturation (Admit) 100 %       Oxygen Saturation (Exercise) 100 %       Rating of Perceived Exertion (Exercise) 12       Perceived Dyspnea (Exercise) 1       Symptoms knee pain 8/10, slightly SOB       Comments walk test results          Exercise Comments:   Exercise Goals and Review:      Exercise Goals    Row Name 09/19/16 1434             Exercise Goals   Increase Physical Activity Yes       Intervention Provide advice, education, support and counseling about physical activity/exercise needs.;Develop an individualized exercise prescription for aerobic and resistive training based on initial evaluation findings, risk stratification, comorbidities and participant's personal goals.       Expected Outcomes Achievement of increased cardiorespiratory fitness and enhanced flexibility, muscular endurance and strength shown through measurements of functional capacity and personal statement of participant.       Increase Strength and Stamina Yes       Intervention Provide advice, education, support and counseling about physical activity/exercise needs.;Develop an individualized exercise prescription for aerobic and resistive training based on initial evaluation findings, risk stratification, comorbidities and participant's personal goals.       Expected Outcomes Achievement of increased cardiorespiratory fitness and enhanced flexibility, muscular endurance and strength shown through measurements of functional capacity and personal statement of participant.          Exercise Goals Re-Evaluation :   Discharge Exercise Prescription (Final Exercise Prescription Changes):     Exercise Prescription Changes - 09/19/16 1400       Response to Exercise   Blood Pressure (Admit) 140/70   Blood Pressure (Exercise) 144/74   Blood Pressure (Exit) 126/72   Heart Rate (Admit) 73 bpm   Heart Rate (Exercise) 111 bpm   Heart Rate (Exit) 78 bpm   Oxygen Saturation (Admit) 100 %   Oxygen Saturation (Exercise) 100 %   Rating of Perceived Exertion (Exercise) 12   Perceived Dyspnea (Exercise) 1   Symptoms knee pain 8/10, slightly SOB   Comments walk test results      Nutrition:  Target Goals: Understanding of nutrition guidelines, daily intake of sodium 1500mg , cholesterol 200mg , calories 30% from fat and 7% or less from saturated fats, daily to have 5 or more servings of fruits and vegetables.  Biometrics:     Pre Biometrics - 09/19/16 1434      Pre Biometrics   Height 5' (1.524 m)   Weight 241 lb 14.4 oz (109.7 kg)   Waist Circumference 45.5 inches   Hip Circumference 56 inches   Waist to Hip Ratio 0.81 %   BMI (Calculated) 47.3   Single Leg Stand 0.98 seconds       Nutrition Therapy Plan and Nutrition Goals:     Nutrition Therapy & Goals - 09/19/16 1313      Nutrition Therapy   Drug/Food Interactions Statins/Certain Fruits     Intervention Plan   Intervention Prescribe, educate and counsel regarding individualized specific dietary modifications aiming towards targeted core components such as weight, hypertension, lipid management, diabetes, heart failure and other comorbidities.   Expected Outcomes Short Term Goal: Understand basic principles of dietary content, such as calories, fat, sodium,  cholesterol and nutrients.      Nutrition Discharge: Rate Your Plate Scores:     Nutrition Assessments - 09/19/16 1218      MEDFICTS Scores   Pre Score 86      Nutrition Goals Re-Evaluation:   Nutrition Goals Discharge (Final Nutrition Goals Re-Evaluation):   Psychosocial: Target Goals: Acknowledge presence or absence of significant depression and/or stress, maximize coping skills, provide positive  support system. Participant is able to verbalize types and ability to use techniques and skills needed for reducing stress and depression.   Initial Review & Psychosocial Screening:     Initial Psych Review & Screening - 09/19/16 1246      Initial Review   Comments Jowanna reports that she got down to 200lbs but when all this happened she "got depressed and just ate up to 240lbs now. Emelynn reports that she is suppose to get a genetic test since many anti-depressants are on the list that possibly her body doesnt' break down correctly. We discussed that even thought she needs knee surgery after her torn menisus that exercise usually helps with depression. Dellis Filbert states that she doesn't want to kill herself but she "wants to buy a train ticket and leave far away but tthey will find me. " Denee reports that her Mother lives with her since her Mother is a Retired Engineer, civil (consulting). She also lives with her boyfriend and his daughter and his daughter's 2 children. Plus there are other children there 4 days away who they babysit.       Quality of Life Scores:      Quality of Life - 09/19/16 1311      Quality of Life Scores   Health/Function Pre 3.8 %   Socioeconomic Pre 5.56 %   Psych/Spiritual Pre 5.36 %   Family Pre 2.4 %   GLOBAL Pre 4.31 %      PHQ-9: Recent Review Flowsheet Data    Depression screen [facility] 2/9 09/19/2016   Decreased Interest 3   Down, Depressed, Hopeless 2   PHQ - 2 Score 5   Altered sleeping 3   Tired, decreased energy 3   Change in appetite 3   Feeling bad or failure about yourself  2   Trouble concentrating 2   Moving slowly or fidgety/restless 2   Suicidal thoughts 0   PHQ-9 Score 20   Difficult doing work/chores Very difficult     Interpretation of Total Score  Total Score Depression Severity:  1-4 = Minimal depression, 5-9 = Mild depression, 10-14 = Moderate depression, 15-19 = Moderately severe depression, 20-27 = Severe depression   Psychosocial Evaluation and  Intervention:   Psychosocial Re-Evaluation:   Psychosocial Discharge (Final Psychosocial Re-Evaluation):   Vocational Rehabilitation: Provide vocational rehab assistance to qualifying candidates.   Vocational Rehab Evaluation & Intervention:     Vocational Rehab - 09/19/16 1241      Initial Vocational Rehab Evaluation & Intervention   Assessment shows need for Vocational Rehabilitation Yes      Education: Education Goals: Education classes will be provided on a weekly basis, covering required topics. Participant will state understanding/return demonstration of topics presented.  Learning Barriers/Preferences:     Learning Barriers/Preferences - 09/19/16 1240      Learning Barriers/Preferences   Learning Barriers None   Learning Preferences None      Education Topics: General Nutrition Guidelines/Fats and Fiber: -Group instruction provided by verbal, written material, models and posters to present the general guidelines for heart healthy nutrition. Gives an  explanation and review of dietary fats and fiber.   Controlling Sodium/Reading Food Labels: -Group verbal and written material supporting the discussion of sodium use in heart healthy nutrition. Review and explanation with models, verbal and written materials for utilization of the food label.   Exercise Physiology & Risk Factors: - Group verbal and written instruction with models to review the exercise physiology of the cardiovascular system and associated critical values. Details cardiovascular disease risk factors and the goals associated with each risk factor.   Aerobic Exercise & Resistance Training: - Gives group verbal and written discussion on the health impact of inactivity. On the components of aerobic and resistive training programs and the benefits of this training and how to safely progress through these programs.   Flexibility, Balance, General Exercise Guidelines: - Provides group verbal and  written instruction on the benefits of flexibility and balance training programs. Provides general exercise guidelines with specific guidelines to those with heart or lung disease. Demonstration and skill practice provided.   Stress Management: - Provides group verbal and written instruction about the health risks of elevated stress, cause of high stress, and healthy ways to reduce stress.   Depression: - Provides group verbal and written instruction on the correlation between heart/lung disease and depressed mood, treatment options, and the stigmas associated with seeking treatment.   Anatomy & Physiology of the Heart: - Group verbal and written instruction and models provide basic cardiac anatomy and physiology, with the coronary electrical and arterial systems. Review of: AMI, Angina, Valve disease, Heart Failure, Cardiac Arrhythmia, Pacemakers, and the ICD.   Cardiac Procedures: - Group verbal and written instruction and models to describe the testing methods done to diagnose heart disease. Reviews the outcomes of the test results. Describes the treatment choices: Medical Management, Angioplasty, or Coronary Bypass Surgery.   Cardiac Medications: - Group verbal and written instruction to review commonly prescribed medications for heart disease. Reviews the medication, class of the drug, and side effects. Includes the steps to properly store meds and maintain the prescription regimen.   Go Sex-Intimacy & Heart Disease, Get SMART - Goal Setting: - Group verbal and written instruction through game format to discuss heart disease and the return to sexual intimacy. Provides group verbal and written material to discuss and apply goal setting through the application of the S.M.A.R.T. Method.   Other Matters of the Heart: - Provides group verbal, written materials and models to describe Heart Failure, Angina, Valve Disease, and Diabetes in the realm of heart disease. Includes description of  the disease process and treatment options available to the cardiac patient.   Exercise & Equipment Safety: - Individual verbal instruction and demonstration of equipment use and safety with use of the equipment.   Cardiac Rehab from 09/19/2016 in [facility] Cardiac and Pulmonary Rehab  Date  09/19/16  Educator  Salena Saner ENterkinRN  Instruction Review Code  1- partially meets, needs review/practice      Infection Prevention: - Provides verbal and written material to individual with discussion of infection control including proper hand washing and proper equipment cleaning during exercise session.   Cardiac Rehab from 09/19/2016 in [facility] Cardiac and Pulmonary Rehab  Date  09/19/16  Educator  C. Amalea Ottey, R.N.  Instruction Review Code  2- meets goals/outcomes      Falls Prevention: - Provides verbal and written material to individual with discussion of falls prevention and safety.   Cardiac Rehab from 09/19/2016 in [facility] Cardiac and Pulmonary Rehab  Date  09/19/16  Educator  Salena Saner EnterkinRN  Instruction Review Code  2- meets goals/outcomes      Diabetes: - Individual verbal and written instruction to review signs/symptoms of diabetes, desired ranges of glucose level fasting, after meals and with exercise. Advice that pre and post exercise glucose checks will be done for 3 sessions at entry of program.   Cardiac Rehab from 09/19/2016 in [facility] Cardiac and Pulmonary Rehab  Date  09/19/16  Educator  C. Breyden Jeudy, RN  Instruction Review Code  1- partially meets, needs review/practice       Knowledge Questionnaire Score:     Knowledge Questionnaire Score - 09/19/16 1218      Knowledge Questionnaire Score   Pre Score 19/28      Core Components/Risk Factors/Patient Goals at Admission:     Personal Goals and Risk Factors at Admission - 09/19/16 1214      Core Components/Risk Factors/Patient Goals on Admission    Weight Management Yes;Obesity   Intervention Weight Management: Develop a combined  nutrition and exercise program designed to reach desired caloric intake, while maintaining appropriate intake of nutrient and fiber, sodium and fats, and appropriate energy expenditure required for the weight goal.;Weight Management: Provide education and appropriate resources to help participant work on and attain dietary goals.;Weight Management/Obesity: Establish reasonable short term and long term weight goals.;Obesity: Provide education and appropriate resources to help participant work on and attain dietary goals.   Admit Weight 241 lb 14.4 oz (109.7 kg)   Goal Weight: Short Term 235 lb (106.6 kg)   Goal Weight: Long Term 200 lb (90.7 kg)   Expected Outcomes Short Term: Continue to assess and modify interventions until short term weight is achieved;Weight Loss: Understanding of general recommendations for a balanced deficit meal plan, which promotes 1-2 lb weight loss per week and includes a negative energy balance of [phone] kcal/d;Understanding recommendations for meals to include 15-35% energy as protein, 25-35% energy from fat, 35-60% energy from carbohydrates, less than 200mg  of dietary cholesterol, 20-35 gm of total fiber daily;Long Term: Adherence to nutrition and physical activity/exercise program aimed toward attainment of established weight goal;Understanding of distribution of calorie intake throughout the day with the consumption of 4-5 meals/snacks   Tobacco Cessation Yes   Intervention Assist the participant in steps to quit. Provide individualized education and counseling about committing to Tobacco Cessation, relapse prevention, and pharmacological support that can be provided by physician.   Expected Outcomes Long Term: Complete abstinence from all tobacco products for at least 12 months from quit date.   Improve shortness of breath with ADL's Yes   Intervention Provide education, individualized exercise plan and daily activity instruction to help decrease symptoms of SOB with  activities of daily living.   Expected Outcomes Short Term: Achieves a reduction of symptoms when performing activities of daily living.   Diabetes Yes   Intervention Provide education about signs/symptoms and action to take for hypo/hyperglycemia.;Provide education about proper nutrition, including hydration, and aerobic/resistive exercise prescription along with prescribed medications to achieve blood glucose in normal ranges: Fasting glucose 65-99 mg/dL   Expected Outcomes Short Term: Participant verbalizes understanding of the signs/symptoms and immediate care of hyper/hypoglycemia, proper foot care and importance of medication, aerobic/resistive exercise and nutrition plan for blood glucose control.;Long Term: Attainment of HbA1C < 7%.   Hypertension Yes   Intervention Provide education on lifestyle modifcations including regular physical activity/exercise, weight management, moderate sodium restriction and increased consumption of fresh fruit, vegetables, and low fat dairy, alcohol moderation, and smoking cessation.;Monitor prescription use  compliance.   Expected Outcomes Short Term: Continued assessment and intervention until BP is < 140/29mm HG in hypertensive participants. < 130/13mm HG in hypertensive participants with diabetes, heart failure or chronic kidney disease.;Long Term: Maintenance of blood pressure at goal levels.   Lipids Yes   Intervention Provide education and support for participant on nutrition & aerobic/resistive exercise along with prescribed medications to achieve LDL 70mg , HDL >40mg .   Expected Outcomes Short Term: Participant states understanding of desired cholesterol values and is compliant with medications prescribed. Participant is following exercise prescription and nutrition guidelines.;Long Term: Cholesterol controlled with medications as prescribed, with individualized exercise RX and with personalized nutrition plan. Value goals: LDL < 70mg , HDL > 40 mg.   Stress  Yes   Intervention Offer individual and/or small group education and counseling on adjustment to heart disease, stress management and health-related lifestyle change. Teach and support self-help strategies.;Refer participants experiencing significant psychosocial distress to appropriate mental health specialists for further evaluation and treatment. When possible, include family members and significant others in education/counseling sessions.   Expected Outcomes Short Term: Participant demonstrates changes in health-related behavior, relaxation and other stress management skills, ability to obtain effective social support, and compliance with psychotropic medications if prescribed.;Long Term: Emotional wellbeing is indicated by absence of clinically significant psychosocial distress or social isolation.      Core Components/Risk Factors/Patient Goals Review:    Core Components/Risk Factors/Patient Goals at Discharge (Final Review):    ITP Comments:     ITP Comments    Row Name 09/19/16 1244 09/19/16 1311         ITP Comments Mana reports she has Lupus, Rheumatoid Arthritis, Fibryomyalgia. 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 Conya reports that she got down to 200lbs but when all this happened she "got depressed and just ate up to 240lbs now. " Messiah reports that she has had diabetes for 5 years.  ITP was created during Medical Review after Cardiac Rehab informed consent was signed. DX EPIC/CHL discharge summary         Comments: Ready to start Cardiac Rehab Monday, Wed and Fridays. Bernell is scheduled to see her MD tomorrow and will bring up about her weight being up 5lbs this week and that she doesn't feel her antidepressant is helping her.

## 2016-09-19 NOTE — Patient Instructions (Signed)
Patient Instructions  Patient Details  Name: [name] MRN: [id number] Date of Birth: 07/11/1965 Referring Provider:  Teryl LucyWard, [place], MD  Below are the personal goals you chose as well as exercise and nutrition goals. Our goal is to help you keep on track towards obtaining and maintaining your goals. We will be discussing your progress on these goals with you throughout the program.  Initial Exercise Prescription:     Initial Exercise Prescription - 09/19/16 1400      Date of Initial Exercise RX and Referring Provider   Date 09/19/16   Referring Provider Ward, Georga Hackingary MD     Treadmill   MPH 1.7   Grade 0.5   Minutes 15   METs 2.42     NuStep   Level 2   SPM 80   Minutes 15   METs 2.5     Biostep-RELP   Level 2   SPM 50   Minutes 15   METs 2     Prescription Details   Frequency (times per week) 3   Duration Progress to 45 minutes of aerobic exercise without signs/symptoms of physical distress     Intensity   THRR 40-80% of Max Heartrate 112-151   Ratings of Perceived Exertion 11-15   Perceived Dyspnea 0-4     Progression   Progression Continue to progress workloads to maintain intensity without signs/symptoms of physical distress.     Resistance Training   Training Prescription Yes   Weight 3 lbs   Reps 10-15      Exercise Goals: Frequency: Be able to perform aerobic exercise three times per week working toward 3-5 days per week.  Intensity: Work with a perceived exertion of 11 (fairly light) - 15 (hard) as tolerated. Follow your new exercise prescription and watch for changes in prescription as you progress with the program. Changes will be reviewed with you when they are made.  Duration: You should be able to do 30 minutes of continuous aerobic exercise in addition to a 5 minute warm-up and a 5 minute cool-down routine.  Nutrition Goals: Your personal nutrition goals will be established when you do your nutrition analysis with the dietician.  The following  are nutrition guidelines to follow: Cholesterol < 200mg /day Sodium < 1500mg /day Fiber: Women under 50 yrs - 25 grams per day  Personal Goals:     Personal Goals and Risk Factors at Admission - 09/19/16 1214      Core Components/Risk Factors/Patient Goals on Admission    Weight Management Yes;Obesity   Intervention Weight Management: Develop a combined nutrition and exercise program designed to reach desired caloric intake, while maintaining appropriate intake of nutrient and fiber, sodium and fats, and appropriate energy expenditure required for the weight goal.;Weight Management: Provide education and appropriate resources to help participant work on and attain dietary goals.;Weight Management/Obesity: Establish reasonable short term and long term weight goals.;Obesity: Provide education and appropriate resources to help participant work on and attain dietary goals.   Admit Weight 241 lb 14.4 oz (109.7 kg)   Goal Weight: Short Term 235 lb (106.6 kg)   Goal Weight: Long Term 200 lb (90.7 kg)   Expected Outcomes Short Term: Continue to assess and modify interventions until short term weight is achieved;Weight Loss: Understanding of general recommendations for a balanced deficit meal plan, which promotes 1-2 lb weight loss per week and includes a negative energy balance of [phone] kcal/d;Understanding recommendations for meals to include 15-35% energy as protein, 25-35% energy from fat, 35-60% energy from  carbohydrates, less than 200mg  of dietary cholesterol, 20-35 gm of total fiber daily;Long Term: Adherence to nutrition and physical activity/exercise program aimed toward attainment of established weight goal;Understanding of distribution of calorie intake throughout the day with the consumption of 4-5 meals/snacks   Tobacco Cessation Yes   Intervention Assist the participant in steps to quit. Provide individualized education and counseling about committing to Tobacco Cessation, relapse prevention,  and pharmacological support that can be provided by physician.   Expected Outcomes Long Term: Complete abstinence from all tobacco products for at least 12 months from quit date.   Improve shortness of breath with ADL's Yes   Intervention Provide education, individualized exercise plan and daily activity instruction to help decrease symptoms of SOB with activities of daily living.   Expected Outcomes Short Term: Achieves a reduction of symptoms when performing activities of daily living.   Diabetes Yes   Intervention Provide education about signs/symptoms and action to take for hypo/hyperglycemia.;Provide education about proper nutrition, including hydration, and aerobic/resistive exercise prescription along with prescribed medications to achieve blood glucose in normal ranges: Fasting glucose 65-99 mg/dL   Expected Outcomes Short Term: Participant verbalizes understanding of the signs/symptoms and immediate care of hyper/hypoglycemia, proper foot care and importance of medication, aerobic/resistive exercise and nutrition plan for blood glucose control.;Long Term: Attainment of HbA1C < 7%.   Hypertension Yes   Intervention Provide education on lifestyle modifcations including regular physical activity/exercise, weight management, moderate sodium restriction and increased consumption of fresh fruit, vegetables, and low fat dairy, alcohol moderation, and smoking cessation.;Monitor prescription use compliance.   Expected Outcomes Short Term: Continued assessment and intervention until BP is < 140/50mm HG in hypertensive participants. < 130/81mm HG in hypertensive participants with diabetes, heart failure or chronic kidney disease.;Long Term: Maintenance of blood pressure at goal levels.   Lipids Yes   Intervention Provide education and support for participant on nutrition & aerobic/resistive exercise along with prescribed medications to achieve LDL 70mg , HDL >40mg .   Expected Outcomes Short Term:  Participant states understanding of desired cholesterol values and is compliant with medications prescribed. Participant is following exercise prescription and nutrition guidelines.;Long Term: Cholesterol controlled with medications as prescribed, with individualized exercise RX and with personalized nutrition plan. Value goals: LDL < 70mg , HDL > 40 mg.   Stress Yes   Intervention Offer individual and/or small group education and counseling on adjustment to heart disease, stress management and health-related lifestyle change. Teach and support self-help strategies.;Refer participants experiencing significant psychosocial distress to appropriate mental health specialists for further evaluation and treatment. When possible, include family members and significant others in education/counseling sessions.   Expected Outcomes Short Term: Participant demonstrates changes in health-related behavior, relaxation and other stress management skills, ability to obtain effective social support, and compliance with psychotropic medications if prescribed.;Long Term: Emotional wellbeing is indicated by absence of clinically significant psychosocial distress or social isolation.      Tobacco Use Initial Evaluation: History  Smoking Status  . Former Smoker  . Packs/day: 0.50  . Years: 15.00  . Types: Cigarettes  . Quit date: 06/26/2016  Smokeless Tobacco  . Never Used    Copy of goals given to participant.

## 2016-09-20 ENCOUNTER — Encounter: Payer: Self-pay | Admitting: *Deleted

## 2016-09-20 ENCOUNTER — Telehealth: Payer: Self-pay | Admitting: *Deleted

## 2016-09-20 NOTE — Telephone Encounter (Signed)
I left [name] a vm that she can get a free physical therapy screening for her knee problem. She can call [phone] or check in at the front desk.

## 2016-09-21 ENCOUNTER — Encounter: Payer: Managed Care, Other (non HMO) | Admitting: *Deleted

## 2016-09-21 DIAGNOSIS — Z952 Presence of prosthetic heart valve: Secondary | ICD-10-CM

## 2016-09-21 DIAGNOSIS — Z954 Presence of other heart-valve replacement: Secondary | ICD-10-CM | POA: Diagnosis not present

## 2016-09-21 LAB — GLUCOSE, CAPILLARY
GLUCOSE-CAPILLARY: 102 mg/dL — AB (ref 65–99)
GLUCOSE-CAPILLARY: 96 mg/dL (ref 65–99)

## 2016-09-21 NOTE — Progress Notes (Signed)
Daily Session Note  Patient Details  Name: [name] MRN: [id number] Date of Birth: [birth year] Referring Provider:     Cardiac Rehab from 09/19/2016 in [facility] Cardiac and Pulmonary Rehab  Referring Provider  Ward, Jeani Hawking MD      Encounter Date: 09/21/2016  Check In:     Session Check In - 09/21/16 0858      Check-In   Location [facility]-Cardiac & Pulmonary Rehab   Staff Present Heath Lark, RN, BSN, CCRP;Jessica Luan Pulling, MA, ACSM RCEP, Exercise Physiologist;Krista Frederico Hamman, RN BSN;Cyntia Staley Joya Gaskins RN,BSN   Supervising physician immediately available to respond to emergencies See telemetry face sheet for immediately available ER MD   Medication changes reported     No   Fall or balance concerns reported    No   Tobacco Cessation No Change   Warm-up and Cool-down Performed on first and last piece of equipment   Resistance Training Performed Yes   VAD Patient? No     Pain Assessment   Currently in Pain? No/denies   Multiple Pain Sites No         History  Smoking Status  . Former Smoker  . Packs/day: 0.50  . Years: 15.00  . Types: Cigarettes  . Quit date: 06/26/2016  Smokeless Tobacco  . Never Used    Goals Met:  Independence with exercise equipment Exercise tolerated well No report of cardiac concerns or symptoms Strength training completed today  Goals Unmet:  Not Applicable  Comments: First full day of exercise!  Patient was oriented to gym and equipment including functions, settings, policies, and procedures.  Patient's individual exercise prescription and treatment plan were reviewed.  All starting workloads were established based on the results of the 6 minute walk test done at initial orientation visit.  The plan for exercise progression was also introduced and progression will be customized based on patient's performance and goals.   Dr. Emily Filbert is Medical Director for [place] and [facility].

## 2016-09-26 ENCOUNTER — Encounter: Payer: Self-pay | Admitting: *Deleted

## 2016-09-26 ENCOUNTER — Telehealth: Payer: Self-pay | Admitting: *Deleted

## 2016-09-26 NOTE — Telephone Encounter (Signed)
[name] left a vm on the Cardiac Rehab phone vm that she would not be able to attend today since she is sick.

## 2016-09-30 ENCOUNTER — Encounter: Payer: Managed Care, Other (non HMO) | Attending: Internal Medicine

## 2016-09-30 DIAGNOSIS — Z87891 Personal history of nicotine dependence: Secondary | ICD-10-CM | POA: Insufficient documentation

## 2016-09-30 DIAGNOSIS — I2510 Atherosclerotic heart disease of native coronary artery without angina pectoris: Secondary | ICD-10-CM | POA: Insufficient documentation

## 2016-09-30 DIAGNOSIS — I10 Essential (primary) hypertension: Secondary | ICD-10-CM | POA: Insufficient documentation

## 2016-09-30 DIAGNOSIS — E119 Type 2 diabetes mellitus without complications: Secondary | ICD-10-CM | POA: Insufficient documentation

## 2016-09-30 DIAGNOSIS — M329 Systemic lupus erythematosus, unspecified: Secondary | ICD-10-CM | POA: Insufficient documentation

## 2016-09-30 DIAGNOSIS — Z954 Presence of other heart-valve replacement: Secondary | ICD-10-CM | POA: Insufficient documentation

## 2016-10-03 ENCOUNTER — Encounter: Payer: Managed Care, Other (non HMO) | Admitting: *Deleted

## 2016-10-03 DIAGNOSIS — E119 Type 2 diabetes mellitus without complications: Secondary | ICD-10-CM | POA: Diagnosis not present

## 2016-10-03 DIAGNOSIS — Z954 Presence of other heart-valve replacement: Secondary | ICD-10-CM | POA: Diagnosis not present

## 2016-10-03 DIAGNOSIS — I10 Essential (primary) hypertension: Secondary | ICD-10-CM | POA: Diagnosis not present

## 2016-10-03 DIAGNOSIS — Z952 Presence of prosthetic heart valve: Secondary | ICD-10-CM

## 2016-10-03 DIAGNOSIS — Z87891 Personal history of nicotine dependence: Secondary | ICD-10-CM | POA: Diagnosis not present

## 2016-10-03 DIAGNOSIS — I2510 Atherosclerotic heart disease of native coronary artery without angina pectoris: Secondary | ICD-10-CM | POA: Diagnosis not present

## 2016-10-03 DIAGNOSIS — M329 Systemic lupus erythematosus, unspecified: Secondary | ICD-10-CM | POA: Diagnosis not present

## 2016-10-03 LAB — GLUCOSE, CAPILLARY
Glucose-Capillary: 89 mg/dL (ref 65–99)
Glucose-Capillary: 104 mg/dL — ABNORMAL HIGH (ref 65–99)

## 2016-10-03 NOTE — Progress Notes (Signed)
Daily Session Note  Patient Details  Name: [name] MRN: [id number] Date of Birth: [birth year] Referring Provider:     Cardiac Rehab from 09/19/2016 in [facility] Cardiac and Pulmonary Rehab  Referring Provider  Ward, Jeani Hawking MD      Encounter Date: 10/03/2016  Check In:     Session Check In - 10/03/16 0902      Check-In   Location [facility]-Cardiac & Pulmonary Rehab   Staff Present Gerlene Burdock, RN, Moises Blood, BS, ACSM CEP, Exercise Physiologist;Tatum Massman Frederico Hamman, RN BSN   Supervising physician immediately available to respond to emergencies See telemetry face sheet for immediately available ER MD   Medication changes reported     Yes   Comments Plaquenil stopped by MD   Fall or balance concerns reported    No   Tobacco Cessation No Change   Warm-up and Cool-down Performed on first and last piece of equipment   Resistance Training Performed Yes   VAD Patient? No     Pain Assessment   Currently in Pain? No/denies         History  Smoking Status  . Former Smoker  . Packs/day: 0.50  . Years: 15.00  . Types: Cigarettes  . Quit date: 06/26/2016  Smokeless Tobacco  . Never Used    Goals Met:  Independence with exercise equipment Exercise tolerated well No report of cardiac concerns or symptoms Strength training completed today  Goals Unmet:  Not Applicable  Comments: Pt able to follow exercise prescription today without complaint.  Will continue to monitor for progression.    Dr. Emily Filbert is Medical Director for [place] and [facility].

## 2016-10-07 ENCOUNTER — Encounter: Payer: Managed Care, Other (non HMO) | Admitting: *Deleted

## 2016-10-07 DIAGNOSIS — Z952 Presence of prosthetic heart valve: Secondary | ICD-10-CM

## 2016-10-07 DIAGNOSIS — Z954 Presence of other heart-valve replacement: Secondary | ICD-10-CM | POA: Diagnosis not present

## 2016-10-07 LAB — GLUCOSE, CAPILLARY
GLUCOSE-CAPILLARY: 87 mg/dL (ref 65–99)
GLUCOSE-CAPILLARY: 95 mg/dL (ref 65–99)

## 2016-10-07 NOTE — Progress Notes (Signed)
Daily Session Note  Patient Details  Name: [name] MRN: [id number] Date of Birth: 07-11-1965 Referring Provider:     Cardiac Rehab from 09/19/2016 in [facility] Cardiac and Pulmonary Rehab  Referring Provider  Ward, Jeani Hawking MD      Encounter Date: 10/07/2016  Check In:     Session Check In - 10/07/16 0852      Check-In   Staff Present Nyoka Cowden, RN, BSN, MA;Susanne Bice, RN, BSN, Lance Sell, BA, ACSM CEP, Exercise Physiologist   Supervising physician immediately available to respond to emergencies See telemetry face sheet for immediately available ER MD   Medication changes reported     Yes   Comments Metoprolol changed from Citrate, added Norvasc 5 mg       Fall or balance concerns reported    No   Warm-up and Cool-down Performed on first and last piece of equipment   Resistance Training Performed Yes   VAD Patient? No     Pain Assessment   Currently in Pain? No/denies         History  Smoking Status  . Former Smoker  . Packs/day: 0.50  . Years: 15.00  . Types: Cigarettes  . Quit date: 06/26/2016  Smokeless Tobacco  . Never Used    Goals Met:  Exercise tolerated well No report of cardiac concerns or symptoms Strength training completed today  Goals Unmet:  Not Applicable  Comments: Doing well with exercise prescription progression.    Dr. Emily Filbert is Medical Director for [place] and [facility].

## 2016-10-10 ENCOUNTER — Encounter: Payer: Managed Care, Other (non HMO) | Admitting: *Deleted

## 2016-10-10 DIAGNOSIS — Z954 Presence of other heart-valve replacement: Secondary | ICD-10-CM | POA: Diagnosis not present

## 2016-10-10 DIAGNOSIS — Z952 Presence of prosthetic heart valve: Secondary | ICD-10-CM

## 2016-10-10 NOTE — Progress Notes (Signed)
Daily Session Note  Patient Details  Name: [name] MRN: [id number] Date of Birth: Aug 09, 1965 Referring Provider:     Cardiac Rehab from 09/19/2016 in [facility] Cardiac and Pulmonary Rehab  Referring Provider  Ward, Jeani Hawking MD      Encounter Date: 10/10/2016  Check In:     Session Check In - 10/10/16 0751      Check-In   Location [facility]-Cardiac & Pulmonary Rehab   Staff Present Gerlene Burdock, RN, Levie Heritage, MA, ACSM RCEP, Exercise Physiologist;Kelly Amedeo Plenty, BS, ACSM CEP, Exercise Physiologist   Supervising physician immediately available to respond to emergencies See telemetry face sheet for immediately available ER MD   Medication changes reported     No   Fall or balance concerns reported    No   Warm-up and Cool-down Performed on first and last piece of equipment   Resistance Training Performed Yes   VAD Patient? No     Pain Assessment   Currently in Pain? No/denies   Multiple Pain Sites No         History  Smoking Status  . Former Smoker  . Packs/day: 0.50  . Years: 15.00  . Types: Cigarettes  . Quit date: 06/26/2016  Smokeless Tobacco  . Never Used    Goals Met:  Independence with exercise equipment Exercise tolerated well No report of cardiac concerns or symptoms Strength training completed today  Goals Unmet:  Not Applicable  Comments: Pt able to follow exercise prescription today without complaint.  Will continue to monitor for progression.    Dr. Emily Filbert is Medical Director for [place] and [facility].

## 2016-10-11 ENCOUNTER — Encounter: Payer: Self-pay | Admitting: Emergency Medicine

## 2016-10-11 ENCOUNTER — Emergency Department
Admission: EM | Admit: 2016-10-11 | Discharge: 2016-10-11 | Disposition: A | Discharge status: Home / Self Care | Payer: Managed Care, Other (non HMO) | Attending: Emergency Medicine | Admitting: Emergency Medicine

## 2016-10-11 ENCOUNTER — Emergency Department: Payer: Managed Care, Other (non HMO)

## 2016-10-11 DIAGNOSIS — Z87891 Personal history of nicotine dependence: Secondary | ICD-10-CM | POA: Insufficient documentation

## 2016-10-11 DIAGNOSIS — Y9301 Activity, walking, marching and hiking: Secondary | ICD-10-CM | POA: Insufficient documentation

## 2016-10-11 DIAGNOSIS — Y998 Other external cause status: Secondary | ICD-10-CM | POA: Insufficient documentation

## 2016-10-11 DIAGNOSIS — M25561 Pain in right knee: Secondary | ICD-10-CM | POA: Diagnosis not present

## 2016-10-11 DIAGNOSIS — W108XXA Fall (on) (from) other stairs and steps, initial encounter: Secondary | ICD-10-CM | POA: Insufficient documentation

## 2016-10-11 DIAGNOSIS — E119 Type 2 diabetes mellitus without complications: Secondary | ICD-10-CM | POA: Insufficient documentation

## 2016-10-11 DIAGNOSIS — R0789 Other chest pain: Secondary | ICD-10-CM | POA: Diagnosis not present

## 2016-10-11 DIAGNOSIS — I10 Essential (primary) hypertension: Secondary | ICD-10-CM | POA: Insufficient documentation

## 2016-10-11 DIAGNOSIS — S0990XA Unspecified injury of head, initial encounter: Secondary | ICD-10-CM

## 2016-10-11 DIAGNOSIS — M25562 Pain in left knee: Secondary | ICD-10-CM | POA: Insufficient documentation

## 2016-10-11 DIAGNOSIS — M542 Cervicalgia: Secondary | ICD-10-CM | POA: Diagnosis not present

## 2016-10-11 DIAGNOSIS — I2510 Atherosclerotic heart disease of native coronary artery without angina pectoris: Secondary | ICD-10-CM | POA: Insufficient documentation

## 2016-10-11 DIAGNOSIS — Y929 Unspecified place or not applicable: Secondary | ICD-10-CM | POA: Insufficient documentation

## 2016-10-11 DIAGNOSIS — M549 Dorsalgia, unspecified: Secondary | ICD-10-CM | POA: Insufficient documentation

## 2016-10-11 DIAGNOSIS — W19XXXA Unspecified fall, initial encounter: Secondary | ICD-10-CM

## 2016-10-11 MED ORDER — FENTANYL CITRATE (PF) 100 MCG/2ML IJ SOLN
50.0000 ug | Freq: Once | INTRAMUSCULAR | Status: AC
  Administered 2016-10-11: 50 ug via INTRAVENOUS
  Filled 2016-10-11: qty 2

## 2016-10-11 NOTE — ED Triage Notes (Signed)
Patient presents to ED via [facility] from home. Patient was walking outside when her right knee gave out and she fell down 6 concrete steps. Patient denies LOC but states she did hit head head. GCS 15 on arrival. Hematoma noted to head. Patient takes 81mg  of ASA daily as well as 5mg  of warfarin daily. Patient c/o back, neck and chest tenderness upon palpation. A&O x4.

## 2016-10-11 NOTE — Discharge Instructions (Signed)

## 2016-10-11 NOTE — ED Notes (Signed)
Patient transported to CT at this time. 

## 2016-10-11 NOTE — ED Provider Notes (Signed)
[facility] Emergency Department Provider Note  ____________________________________________  Time seen: Approximately 8:45 AM  I have reviewed the triage vital signs and the nursing notes.   HISTORY  Chief Complaint Fall   HPI Louretta ShortenKari Devoto is a 51 y.o. 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 Patient reports that her knees give out on her all the time. This morning they gave out while she was coming down the stairs and she fell down 4 concrete steps. She hit her head but no LOC. She is complaining of pain in her neck, diffuse pain throughout her back, bilateral knees, center of her chest, and her head. The pain is moderate to severe, constant since the fall and nonradiating. She denies any other injuries. She denies saddle anesthesia, weakness of her extremities.  Past Medical History:  Diagnosis Date  . CAD (coronary artery disease)    per cardiac MRI  . Diabetes mellitus without complication (HCC)   . Hypertension   . Lupus (systemic lupus erythematosus) (HCC)     Patient Active Problem List   Diagnosis Date Noted  . 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 Atrial fibrillation (HCC) 07/02/2016  . Anxiety, generalized 06/27/2016  . Chest pain 04/21/2016  . 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 ASHD (arteriosclerotic heart disease) 11/02/2011  . Chronic back pain 11/02/2011    Past Surgical History:  Procedure Laterality Date  . APPENDECTOMY    . CESAREAN SECTION    . KNEE ARTHROSCOPY    . TONSILLECTOMY    . VALVE REPLACEMENT      Prior to Admission medications   Medication Sig Start Date End Date Taking? Authorizing Provider  acetaminophen (TYLENOL) 500 MG tablet Take 500 mg by mouth every 6 (six) hours as needed.   Yes [provider]  acetaminophen-codeine (TYLENOL #3) 300-30 MG  tablet TAKE 1-2 TABLET EVERY 8HOURS AS NEEDED FOR PAIN, MAX 4 TABS/DAY 07/21/16  Yes [provider]  amLODipine (NORVASC) 5 MG tablet Take 5 mg by mouth daily.   Yes [provider]  aspirin 81 MG tablet Take 81 mg by mouth daily.   Yes [provider]  baclofen (LIORESAL) 10 MG tablet Take 10 mg by mouth 2 (two) times daily.   Yes [provider]  FLUoxetine (PROZAC) 40 MG capsule Take 40 mg by mouth 2 (two) times daily.    Yes [provider]  metFORMIN (GLUCOPHAGE) 500 MG tablet Take by mouth 2 (two) times daily with a meal.   Yes [provider]  metoprolol tartrate (LOPRESSOR) 25 MG tablet Take 25 mg by mouth 2 (two) times daily.   Yes [provider]  omeprazole (PRILOSEC) 40 MG capsule Take 40 mg by mouth daily.   Yes [provider]  potassium chloride SA (K-DUR,KLOR-CON) 20 MEQ tablet Take 20 mEq by mouth once.   Yes [provider]  pravastatin (PRAVACHOL) 20 MG tablet Take 20 mg by mouth daily.   Yes [provider]  ranitidine (ZANTAC) 300 MG tablet Take 300 mg by mouth at bedtime.   Yes [provider]  sennosides-docusate sodium (SENOKOT-S) 8.6-50 MG tablet Take 1 tablet by mouth at bedtime.   Yes [provider]  Vitamin D, Ergocalciferol, (DRISDOL) 50000 units CAPS capsule Take 50,000 Units by mouth every 7 (seven) days.   Yes [provider]  warfarin (COUMADIN) 5  MG tablet Take 5 mg by mouth daily.   Yes [provider]  albuterol (PROVENTIL HFA;VENTOLIN HFA) 108 (90 Base) MCG/ACT inhaler Inhale 1-2 puffs into the lungs every 6 (six) hours as needed for wheezing or shortness of breath.    [provider]  budesonide-formoterol (SYMBICORT) 160-4.5 MCG/ACT inhaler Inhale 2 puffs into the lungs 2 (two) times daily. 01/14/16 01/13/17  [provider]  budesonide-formoterol (SYMBICORT) 160-4.5 MCG/ACT inhaler Inhale into the lungs. 01/14/16  01/13/17  [provider]  dicyclomine (BENTYL) 20 MG tablet Take 1 tablet (20 mg total) by mouth 3 (three) times daily as needed for spasms. Patient not taking: Reported on 04/21/2016 05/14/15 05/13/16  Myrna Blazer, MD  furosemide (LASIX) 40 MG tablet Take by mouth. 07/18/16 07/18/17  [provider]  ibuprofen (ADVIL,MOTRIN) 600 MG tablet Take 1 tablet (600 mg total) by mouth 3 (three) times daily. Patient not taking: Reported on 09/19/2016 04/21/16   Adrian Saran, MD  methocarbamol (ROBAXIN) 500 MG tablet Take 1 tablet (500 mg total) by mouth 2 (two) times daily. Patient not taking: Reported on 10/11/2016 01/06/15   Payton Mccallum, MD    Allergies Flexeril [cyclobenzaprine]; Celecoxib; Gabapentin; Iodinated diagnostic agents; Morphine and related; Percocet [oxycodone-acetaminophen]; Skelaxin [metaxalone]; Demerol [meperidine]; Nortriptyline; Tramadol; Ketorolac; and Norflex [orphenadrine citrate]  Family History  Problem Relation Age of Onset  . Hyperlipidemia Mother   . Hypertension Father   . Diabetes Father   . Hyperlipidemia Father     Social History Social History  Substance Use Topics  . Smoking status: Former Smoker    Packs/day: 0.50    Years: 15.00    Types: Cigarettes    Quit date: 06/26/2016  . Smokeless tobacco: Never Used  . Alcohol use No    Review of Systems Constitutional: Negative for fever. Eyes: Negative for visual changes. ENT: Negative for facial injury. + neck pain Cardiovascular: + chest wall pain. Respiratory: Negative for shortness of breath.  Gastrointestinal: Negative for abdominal pain or injury. Genitourinary: Negative for dysuria. Musculoskeletal: + back pain, and b/l knee pain Skin: Negative for laceration/abrasions. Neurological: + head injury.   ____________________________________________   PHYSICAL EXAM:  VITAL SIGNS: ED Triage Vitals  Enc Vitals Group     BP 10/11/16 0830 (!) 158/104     Pulse Rate  10/11/16 0830 75     Resp 10/11/16 0830 16     Temp 10/11/16 0830 98.9 F (37.2 C)     Temp Source 10/11/16 0830 Oral     SpO2 10/11/16 0830 98 %     Weight 10/11/16 0826 246 lb (111.6 kg)     Height 10/11/16 0826 4\' 11"  (1.499 m)     Head Circumference --      Peak Flow --      Pain Score 10/11/16 0826 9     Pain Loc --      Pain Edu? --      Excl. in GC? --    Full spinal precautions maintained throughout the trauma exam. Constitutional: Alert and oriented. No acute distress. Does not appear intoxicated. HEENT Head: Normocephalic and atraumatic. Face: No facial bony tenderness. Stable midface Ears: No hemotympanum bilaterally. No Battle sign Eyes: No eye injury. PERRL. No raccoon eyes Nose: Nontender. No epistaxis. No rhinorrhea Mouth/Throat: Mucous membranes are moist. No oropharyngeal blood. No dental injury. Airway patent without stridor. Normal voice. Neck: no C-collar in place which was placed on patient on arrival. No midline c-spine tenderness. Diffuse paraspinal ttp Cardiovascular:  Normal rate, regular rhythm. Normal and symmetric distal pulses are present in all extremities. Pulmonary/Chest: Chest wall is stable, ttp over the central part of the chest with no evidence of injury. Normal respiratory effort. Breath sounds are normal. No crepitus.  Abdominal: Soft, nontender, non distended. Musculoskeletal: ttp over b/l knees with intact full ROM. Nontender with normal full range of motion in all other extremities. No deformities. Diffuse paraspinal thoracic and lumbar spine ttp. No thoracic or lumbar midline spinal tenderness. Pelvis is stable. Skin: Skin is warm, dry and intact. No abrasions or contutions. Psychiatric: Speech and behavior are appropriate. Neurological: Normal speech and language. Moves all extremities to command. No gross focal neurologic deficits are appreciated.  Glascow Coma Score: 4 - Opens eyes on own 6 - Follows simple motor commands 5 - Alert and  oriented GCS: 15  ____________________________________________   LABS (all labs ordered are listed, but only abnormal results are displayed)  Labs Reviewed - No data to display ____________________________________________  EKG  none  ____________________________________________  RADIOLOGY  reviewed  ____________________________________________   PROCEDURES  Procedure(s) performed: None Procedures Critical Care performed:  None ____________________________________________   INITIAL IMPRESSION / ASSESSMENT AND PLAN / ED COURSE  51 y.o. 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 Plan for head CT, CT cervical spine, x-ray of thoracic and lumbar spine, bilateral knees, and chest x-ray. No other injuries based on physical and history. No signs or symptoms of basilar skull fracture.  Clinical Course as of Oct 12 1538  Tue Oct 11, 2016  1101 Imaging with no acute findings. Patient's pain is well controlled. She is ambulatory. We'll discharge home at this time.  [CV]    Clinical Course User Index [CV] Nita Sickle, MD    Pertinent labs & imaging results that were available during my care of the patient were reviewed by me and considered in my medical decision making (see chart for details).    ____________________________________________   FINAL CLINICAL IMPRESSION(S) / ED DIAGNOSES  Final diagnoses:  Fall, initial encounter  Injury of head, initial encounter  Neck pain  Acute bilateral back pain, unspecified back location  Acute pain of both knees  Chest wall pain      NEW MEDICATIONS STARTED DURING THIS VISIT:  Discharge Medication List as of 10/11/2016 11:03 AM       Note:  This document was prepared using Dragon voice recognition software and may include unintentional dictation errors.    Don Perking, [place], MD 10/11/16 1540

## 2016-10-12 ENCOUNTER — Encounter: Payer: Self-pay | Admitting: *Deleted

## 2016-10-12 ENCOUNTER — Telehealth: Payer: Self-pay | Admitting: *Deleted

## 2016-10-12 DIAGNOSIS — Z952 Presence of prosthetic heart valve: Secondary | ICD-10-CM

## 2016-10-12 NOTE — Telephone Encounter (Signed)
Sherrilyn RistKari called to let us know that she has been out because she fell down concrete steps at home.  She was evaluated by the ER.  She also has a follow up appointment on Friday and will be out of class.

## 2016-10-12 NOTE — Progress Notes (Signed)
Cardiac Individual Treatment Plan  Patient Details  Name: Keron Koffman MRN: [id number] Date of Birth: Jan 08, 1966 Referring Provider:     Cardiac Rehab from 09/19/2016 in [facility] Cardiac and Pulmonary Rehab  Referring Provider  Ward, Jeani Hawking MD      Initial Encounter Date:    Cardiac Rehab from 09/19/2016 in [facility] Cardiac and Pulmonary Rehab  Date  09/19/16  Referring Provider  Ward, Jeani Hawking MD      Visit Diagnosis: Aortic valve replaced  Patient's Home Medications on Admission:  Current Outpatient Prescriptions:  .  acetaminophen (TYLENOL) 500 MG tablet, Take 500 mg by mouth every 6 (six) hours as needed., Disp: , Rfl:  .  acetaminophen-codeine (TYLENOL #3) 300-30 MG tablet, TAKE 1-2 TABLET EVERY 8HOURS AS NEEDED FOR PAIN, MAX 4 TABS/DAY, Disp: , Rfl:  .  albuterol (PROVENTIL HFA;VENTOLIN HFA) 108 (90 Base) MCG/ACT inhaler, Inhale 1-2 puffs into the lungs every 6 (six) hours as needed for wheezing or shortness of breath., Disp: , Rfl:  .  amLODipine (NORVASC) 5 MG tablet, Take 5 mg by mouth daily., Disp: , Rfl:  .  aspirin 81 MG tablet, Take 81 mg by mouth daily., Disp: , Rfl:  .  baclofen (LIORESAL) 10 MG tablet, Take 10 mg by mouth 2 (two) times daily., Disp: , Rfl:  .  budesonide-formoterol (SYMBICORT) 160-4.5 MCG/ACT inhaler, Inhale 2 puffs into the lungs 2 (two) times daily., Disp: , Rfl:  .  budesonide-formoterol (SYMBICORT) 160-4.5 MCG/ACT inhaler, Inhale into the lungs., Disp: , Rfl:  .  dicyclomine (BENTYL) 20 MG tablet, Take 1 tablet (20 mg total) by mouth 3 (three) times daily as needed for spasms. (Patient not taking: Reported on 04/21/2016), Disp: 15 tablet, Rfl: 0 .  FLUoxetine (PROZAC) 40 MG capsule, Take 40 mg by mouth 2 (two) times daily. , Disp: , Rfl:  .  furosemide (LASIX) 40 MG tablet, Take by mouth., Disp: , Rfl:  .  ibuprofen (ADVIL,MOTRIN) 600 MG tablet, Take 1 tablet (600 mg total) by mouth 3 (three) times daily. (Patient not taking: Reported on 09/19/2016), Disp: 30  tablet, Rfl: 0 .  metFORMIN (GLUCOPHAGE) 500 MG tablet, Take by mouth 2 (two) times daily with a meal., Disp: , Rfl:  .  methocarbamol (ROBAXIN) 500 MG tablet, Take 1 tablet (500 mg total) by mouth 2 (two) times daily. (Patient not taking: Reported on 10/11/2016), Disp: 20 tablet, Rfl: 0 .  metoprolol tartrate (LOPRESSOR) 25 MG tablet, Take 25 mg by mouth 2 (two) times daily., Disp: , Rfl:  .  omeprazole (PRILOSEC) 40 MG capsule, Take 40 mg by mouth daily., Disp: , Rfl:  .  potassium chloride SA (K-DUR,KLOR-CON) 20 MEQ tablet, Take 20 mEq by mouth once., Disp: , Rfl:  .  pravastatin (PRAVACHOL) 20 MG tablet, Take 20 mg by mouth daily., Disp: , Rfl:  .  ranitidine (ZANTAC) 300 MG tablet, Take 300 mg by mouth at bedtime., Disp: , Rfl:  .  sennosides-docusate sodium (SENOKOT-S) 8.6-50 MG tablet, Take 1 tablet by mouth at bedtime., Disp: , Rfl:  .  Vitamin D, Ergocalciferol, (DRISDOL) 50000 units CAPS capsule, Take 50,000 Units by mouth every 7 (seven) days., Disp: , Rfl:  .  warfarin (COUMADIN) 5 MG tablet, Take 5 mg by mouth daily., Disp: , Rfl:   Past Medical History: Past Medical History:  Diagnosis Date  . CAD (coronary artery disease)    per cardiac MRI  . Diabetes mellitus without complication ([place])   . Hypertension   . Lupus (  systemic lupus erythematosus) (H[facility])     Tobacco Use: History  Smoking Status  . Former Smoker  . Packs/day: 0.50  . Years: 15.00  . Types: Cigarettes  . Quit date: 06/26/2016  Smokeless Tobacco  . Never Used    Labs: Recent Review Flowsheet Data    Labs for ITP Cardiac and Pulmonary Rehab Latest Ref Rng & Units 04/21/2016   Hemoglobin A1c 4.8 - 5.6 % 5.4       Exercise Target Goals:    Exercise Program Goal: Individual exercise prescription set with THRR, safety & activity barriers. Participant demonstrates ability to understand and report RPE using BORG scale, to self-measure pulse accurately, and to acknowledge the importance of the exercise  prescription.  Exercise Prescription Goal: Starting with aerobic activity 30 plus minutes a day, 3 days per week for initial exercise prescription. Provide home exercise prescription and guidelines that participant acknowledges understanding prior to discharge.  Activity Barriers & Risk Stratification:     Activity Barriers & Cardiac Risk Stratification - 09/19/16 1217      Activity Barriers & Cardiac Risk Stratification   Activity Barriers Arthritis;Fibromyalgia;Shortness of Breath;Back Problems;Joint Problems;Deconditioning;Muscular Weakness;Balance Concerns;History of Falls  spinal stenosis, r knee meniscus tear, lupus, OA, RA   Cardiac Risk Stratification High      6 Minute Walk:     6 Minute Walk    Row Name 09/19/16 1425         6 Minute Walk   Phase Initial     Distance 1075 feet     Walk Time 6 minutes     # of Rest Breaks 0     MPH 2.04     METS 2.8     RPE 12     Perceived Dyspnea  1     VO2 Peak 9.82     Symptoms Yes (comment)     Comments knee pain 8/10 slightly SOB     Resting HR 73 bpm     Resting BP 140/70     Max Ex. HR 111 bpm     Max Ex. BP 164/74     2 Minute Post BP 126/72        Oxygen Initial Assessment:   Oxygen Re-Evaluation:   Oxygen Discharge (Final Oxygen Re-Evaluation):   Initial Exercise Prescription:     Initial Exercise Prescription - 09/19/16 1400      Date of Initial Exercise RX and Referring Provider   Date 09/19/16   Referring Provider Ward, Jeani Hawking MD     Treadmill   MPH 1.7   Grade 0.5   Minutes 15   METs 2.42     NuStep   Level 2   SPM 80   Minutes 15   METs 2.5     Biostep-RELP   Level 2   SPM 50   Minutes 15   METs 2     Prescription Details   Frequency (times per week) 3   Duration Progress to 45 minutes of aerobic exercise without signs/symptoms of physical distress     Intensity   THRR 40-80% of Max Heartrate 112-151   Ratings of Perceived Exertion 11-15   Perceived Dyspnea 0-4      Progression   Progression Continue to progress workloads to maintain intensity without signs/symptoms of physical distress.     Resistance Training   Training Prescription Yes   Weight 3 lbs   Reps 10-15      Perform Capillary Blood Glucose checks as needed.  Exercise Prescription Changes:     Exercise Prescription Changes    Row Name 09/19/16 1400 10/04/16 1500           Response to Exercise   Blood Pressure (Admit) 140/70 122/82      Blood Pressure (Exercise) 144/74 138/82      Blood Pressure (Exit) 126/72 142/80      Heart Rate (Admit) 73 bpm 80 bpm      Heart Rate (Exercise) 111 bpm 105 bpm      Heart Rate (Exit) 78 bpm 79 bpm      Oxygen Saturation (Admit) 100 %  -      Oxygen Saturation (Exercise) 100 %  -      Rating of Perceived Exertion (Exercise) 12  -      Perceived Dyspnea (Exercise) 1  -      Symptoms knee pain 8/10, slightly SOB none      Comments walk test results  -      Duration  - Progress to 45 minutes of aerobic exercise without signs/symptoms of physical distress      Intensity  - THRR unchanged        Progression   Progression  - Continue to progress workloads to maintain intensity without signs/symptoms of physical distress.      Average METs  - 2.27        Resistance Training   Training Prescription  - Yes      Weight  - 3 lb      Reps  - 10-15        Interval Training   Interval Training  - No        Treadmill   MPH  - 1.7      Grade  - 0.5      Minutes  - 15      METs  - 2.54        Biostep-RELP   Level  - 3      SPM  - 50      Minutes  - 15      METs  - 2         Exercise Comments:     Exercise Comments    Row Name 09/21/16 0901           Exercise Comments  First full day of exercise!  Patient was oriented to gym and equipment including functions, settings, policies, and procedures.  Patient's individual exercise prescription and treatment plan were reviewed.  All starting workloads were established based on the  results of the 6 minute walk test done at initial orientation visit.  The plan for exercise progression was also introduced and progression will be customized based on patient's performance and goals.          Exercise Goals and Review:     Exercise Goals    Row Name 09/19/16 1434             Exercise Goals   Increase Physical Activity Yes       Intervention Provide advice, education, support and counseling about physical activity/exercise needs.;Develop an individualized exercise prescription for aerobic and resistive training based on initial evaluation findings, risk stratification, comorbidities and participant's personal goals.       Expected Outcomes Achievement of increased cardiorespiratory fitness and enhanced flexibility, muscular endurance and strength shown through measurements of functional capacity and personal statement of participant.       Increase Strength and Stamina Yes  Intervention Provide advice, education, support and counseling about physical activity/exercise needs.;Develop an individualized exercise prescription for aerobic and resistive training based on initial evaluation findings, risk stratification, comorbidities and participant's personal goals.       Expected Outcomes Achievement of increased cardiorespiratory fitness and enhanced flexibility, muscular endurance and strength shown through measurements of functional capacity and personal statement of participant.          Exercise Goals Re-Evaluation :     Exercise Goals Re-Evaluation    [place] Name 10/04/16 1544             Exercise Goal Re-Evaluation   Exercise Goals Review Increase Physical Activity;Increase Strenth and Stamina       Comments Shirleymae has tolerated exercise well on her first sessions.       Expected Outcomes Short - attend class on a regular basis Long - Improve functional fitness level          Discharge Exercise Prescription (Final Exercise Prescription Changes):      Exercise Prescription Changes - 10/04/16 1500      Response to Exercise   Blood Pressure (Admit) 122/82   Blood Pressure (Exercise) 138/82   Blood Pressure (Exit) 142/80   Heart Rate (Admit) 80 bpm   Heart Rate (Exercise) 105 bpm   Heart Rate (Exit) 79 bpm   Symptoms none   Duration Progress to 45 minutes of aerobic exercise without signs/symptoms of physical distress   Intensity THRR unchanged     Progression   Progression Continue to progress workloads to maintain intensity without signs/symptoms of physical distress.   Average METs 2.27     Resistance Training   Training Prescription Yes   Weight 3 lb   Reps 10-15     Interval Training   Interval Training No     Treadmill   MPH 1.7   Grade 0.5   Minutes 15   METs 2.54     Biostep-RELP   Level 3   SPM 50   Minutes 15   METs 2      Nutrition:  Target Goals: Understanding of nutrition guidelines, daily intake of sodium <1539m, cholesterol <2034m calories 30% from fat and 7% or less from saturated fats, daily to have 5 or more servings of fruits and vegetables.  Biometrics:     Pre Biometrics - 09/19/16 1434      Pre Biometrics   Height 5' (1.524 m)   Weight 241 lb 14.4 oz (109.7 kg)   Waist Circumference 45.5 inches   Hip Circumference 56 inches   Waist to Hip Ratio 0.81 %   BMI (Calculated) 47.3   Single Leg Stand 0.98 seconds       Nutrition Therapy Plan and Nutrition Goals:     Nutrition Therapy & Goals - 09/19/16 1313      Nutrition Therapy   Drug/Food Interactions Statins/Certain Fruits     Intervention Plan   Intervention Prescribe, educate and counsel regarding individualized specific dietary modifications aiming towards targeted core components such as weight, hypertension, lipid management, diabetes, heart failure and other comorbidities.   Expected Outcomes Short Term Goal: Understand basic principles of dietary content, such as calories, fat, sodium, cholesterol and nutrients.       Nutrition Discharge: Rate Your Plate Scores:     Nutrition Assessments - 09/19/16 1218      MEDFICTS Scores   Pre Score 86      Nutrition Goals Re-Evaluation:   Nutrition Goals Discharge (Final Nutrition Goals Re-Evaluation):  Psychosocial: Target Goals: Acknowledge presence or absence of significant depression and/or stress, maximize coping skills, provide positive support system. Participant is able to verbalize types and ability to use techniques and skills needed for reducing stress and depression.   Initial Review & Psychosocial Screening:     Initial Psych Review & Screening - 09/19/16 1246      Initial Review   Comments Jehieli reports that she got down to 200lbs but when all this happened she "got depressed and just ate up to 240lbs now. Jesyka reports that she is suppose to get a genetic test since many anti-depressants are on the list that possibly her body doesnt' break down correctly. We discussed that even thought she needs knee surgery after her torn menisus that exercise usually helps with depression. Alesia Banda states that she doesn't want to kill herself but she "wants to buy a train ticket and leave far away but tthey will find me. " Makeshia reports that her Mother lives with her since her Mother is a Retired Marine scientist. She also lives with her boyfriend and his daughter and his daughter's 2 children. Plus there are other children there 4 days away who they babysit.       Quality of Life Scores:      Quality of Life - 09/19/16 1311      Quality of Life Scores   Health/Function Pre 3.8 %   Socioeconomic Pre 5.56 %   Psych/Spiritual Pre 5.36 %   Family Pre 2.4 %   GLOBAL Pre 4.31 %      PHQ-9: Recent Review Flowsheet Data    Depression screen [facility] 2/9 09/19/2016   Decreased Interest 3   Down, Depressed, Hopeless 2   PHQ - 2 Score 5   Altered sleeping 3   Tired, decreased energy 3   Change in appetite 3   Feeling bad or failure about yourself  2   Trouble  concentrating 2   Moving slowly or fidgety/restless 2   Suicidal thoughts 0   PHQ-9 Score 20   Difficult doing work/chores Very difficult     Interpretation of Total Score  Total Score Depression Severity:  1-4 = Minimal depression, 5-9 = Mild depression, 10-14 = Moderate depression, 15-19 = Moderately severe depression, 20-27 = Severe depression   Psychosocial Evaluation and Intervention:     Psychosocial Evaluation - 10/03/16 1000      Psychosocial Evaluation & Interventions   Interventions Encouraged to exercise with the program and follow exercise prescription;Stress management education;Therapist referral;Relaxation education   Comments Counselor met with Ms. Elicia Lamp Sharrie Rothman) today for initial psychosocial evaluation.  She is a 51 year old who had an aortic valve replacement several months ago.  Amour also has multiple health issues with diabetes; Lupus; a miniscus tear in her right knee on top of mental health issues.  Armando has a strong support system with a significant other of (2) years whom she lives with and her mother as well.  She has other relatives who live locally.  Brizza reports having sleep difficulties with approximately 3-4 hours of 'good sleep.  Her appetite is up and down - and varies with her mood.  [place] reports a history of depression and anxiety and is on 80 mg of Prozac since last Fall - but states it is "not working."  She has tried other medications for her mood but is very sensitive to medications and none have been extremely helpful.  She has multiple stressors with her health; conflict in the  family with her mother and Significant other's daughter and (2) small children who live with Sharrie Rothman.  She has a PHQ-9 score of 20 which indicates severe depression.  She denies homicidal or suicidal ideations.   She reports she is not being followed with a psychiatrist currently and counselor gave her a recommendation to try to see if her insurance will cover.  Counselor recommended  consistent exercise to see if this helps with mood and sleep somewhat.; and to see the psychiatrist.  Counselor will also recommend a therapist for client locally to help with mood; thoughts and improved coping strategies.  Counselor will be following with Shanya throughout the course of this program.     Expected Outcomes Charmon will benefit from consistent exercise to achieve her stated goals of "getting my life back."  Also this will help improve her mood and possible sleep issues.  Counselor recommended Dayla sees a psychiatrist to address her medications not being helpful at this time.  Counselor will provide stress management and depression education for the class over the next few months to help Whitlee learn to develop positive coping strategies.  Counselor will follow.     Continue Psychosocial Services  Follow up required by counselor      Psychosocial Re-Evaluation:     Psychosocial Re-Evaluation    [place] Name 09/26/16 [phone]             Psychosocial Re-Evaluation   Comments Timira left a vm on the Cardiac Rehab phone vm that she would not be able to attend today since she is sick.          Psychosocial Discharge (Final Psychosocial Re-Evaluation):     Psychosocial Re-Evaluation - 09/26/16 0926      Psychosocial Re-Evaluation   Comments Lyzette left a vm on the Cardiac Rehab phone vm that she would not be able to attend today since she is sick.      Vocational Rehabilitation: Provide vocational rehab assistance to qualifying candidates.   Vocational Rehab Evaluation & Intervention:     Vocational Rehab - 09/19/16 1241      Initial Vocational Rehab Evaluation & Intervention   Assessment shows need for Vocational Rehabilitation Yes      Education: Education Goals: Education classes will be provided on a weekly basis, covering required topics. Participant will state understanding/return demonstration of topics presented.  Learning Barriers/Preferences:     Learning  Barriers/Preferences - 09/19/16 1240      Learning Barriers/Preferences   Learning Barriers None   Learning Preferences None      Education Topics: General Nutrition Guidelines/Fats and Fiber: -Group instruction provided by verbal, written material, models and posters to present the general guidelines for heart healthy nutrition. Gives an explanation and review of dietary fats and fiber.   Controlling Sodium/Reading Food Labels: -Group verbal and written material supporting the discussion of sodium use in heart healthy nutrition. Review and explanation with models, verbal and written materials for utilization of the food label.   Exercise Physiology & Risk Factors: - Group verbal and written instruction with models to review the exercise physiology of the cardiovascular system and associated critical values. Details cardiovascular disease risk factors and the goals associated with each risk factor.   Cardiac Rehab from 10/10/2016 in [facility] Cardiac and Pulmonary Rehab  Date  10/03/16  Educator  [facility]  Instruction Review Code  2- meets goals/outcomes      Aerobic Exercise & Resistance Training: - Gives group verbal and written discussion on the health  impact of inactivity. On the components of aerobic and resistive training programs and the benefits of this training and how to safely progress through these programs.   Flexibility, Balance, General Exercise Guidelines: - Provides group verbal and written instruction on the benefits of flexibility and balance training programs. Provides general exercise guidelines with specific guidelines to those with heart or lung disease. Demonstration and skill practice provided.   Cardiac Rehab from 10/10/2016 in [facility] Cardiac and Pulmonary Rehab  Date  10/10/16  Educator  [facility]  Instruction Review Code  2- meets goals/outcomes      Stress Management: - Provides group verbal and written instruction about the health risks of elevated stress, cause of  high stress, and healthy ways to reduce stress.   Depression: - Provides group verbal and written instruction on the correlation between heart/lung disease and depressed mood, treatment options, and the stigmas associated with seeking treatment.   Cardiac Rehab from 10/10/2016 in [facility] Cardiac and Pulmonary Rehab  Date  09/21/16  Educator  [facility]  Instruction Review Code  2- meets goals/outcomes      Anatomy & Physiology of the Heart: - Group verbal and written instruction and models provide basic cardiac anatomy and physiology, with the coronary electrical and arterial systems. Review of: AMI, Angina, Valve disease, Heart Failure, Cardiac Arrhythmia, Pacemakers, and the ICD.   Cardiac Procedures: - Group verbal and written instruction and models to describe the testing methods done to diagnose heart disease. Reviews the outcomes of the test results. Describes the treatment choices: Medical Management, Angioplasty, or Coronary Bypass Surgery.   Cardiac Medications: - Group verbal and written instruction to review commonly prescribed medications for heart disease. Reviews the medication, class of the drug, and side effects. Includes the steps to properly store meds and maintain the prescription regimen.   Go Sex-Intimacy & Heart Disease, Get SMART - Goal Setting: - Group verbal and written instruction through game format to discuss heart disease and the return to sexual intimacy. Provides group verbal and written material to discuss and apply goal setting through the application of the S.M.A.R.T. Method.   Other Matters of the Heart: - Provides group verbal, written materials and models to describe Heart Failure, Angina, Valve Disease, and Diabetes in the realm of heart disease. Includes description of the disease process and treatment options available to the cardiac patient.   Exercise & Equipment Safety: - Individual verbal instruction and demonstration of equipment use and safety with  use of the equipment.   Cardiac Rehab from 10/10/2016 in [facility] Cardiac and Pulmonary Rehab  Date  09/19/16  Educator  Loletha Grayer ENterkinRN  Instruction Review Code  1- partially meets, needs review/practice      Infection Prevention: - Provides verbal and written material to individual with discussion of infection control including proper hand washing and proper equipment cleaning during exercise session.   Cardiac Rehab from 10/10/2016 in [facility] Cardiac and Pulmonary Rehab  Date  09/19/16  Educator  C. Enterkin, R.N.  Instruction Review Code  2- meets goals/outcomes      Falls Prevention: - Provides verbal and written material to individual with discussion of falls prevention and safety.   Cardiac Rehab from 10/10/2016 in [facility] Cardiac and Pulmonary Rehab  Date  09/19/16  Educator  C. [place]  Instruction Review Code  2- meets goals/outcomes      Diabetes: - Individual verbal and written instruction to review signs/symptoms of diabetes, desired ranges of glucose level fasting, after meals and with exercise. Advice that  pre and post exercise glucose checks will be done for 3 sessions at entry of program.   Cardiac Rehab from 10/10/2016 in [facility] Cardiac and Pulmonary Rehab  Date  09/19/16  Educator  C. Enterkin, RN  Instruction Review Code  1- partially meets, needs review/practice       Knowledge Questionnaire Score:     Knowledge Questionnaire Score - 09/21/16 0915      Knowledge Questionnaire Score   Pre Score 19/28  Reviewed results with patient today.        Core Components/Risk Factors/Patient Goals at Admission:     Personal Goals and Risk Factors at Admission - 09/19/16 1214      Core Components/Risk Factors/Patient Goals on Admission    Weight Management Yes;Obesity   Intervention Weight Management: Develop a combined nutrition and exercise program designed to reach desired caloric intake, while maintaining appropriate intake of nutrient and fiber, sodium and fats,  and appropriate energy expenditure required for the weight goal.;Weight Management: Provide education and appropriate resources to help participant work on and attain dietary goals.;Weight Management/Obesity: Establish reasonable short term and long term weight goals.;Obesity: Provide education and appropriate resources to help participant work on and attain dietary goals.   Admit Weight 241 lb 14.4 oz (109.7 kg)   Goal Weight: Short Term 235 lb (106.6 kg)   Goal Weight: Long Term 200 lb (90.7 kg)   Expected Outcomes Short Term: Continue to assess and modify interventions until short term weight is achieved;Weight Loss: Understanding of general recommendations for a balanced deficit meal plan, which promotes 1-2 lb weight loss per week and includes a negative energy balance of [phone] kcal/d;Understanding recommendations for meals to include 15-35% energy as protein, 25-35% energy from fat, 35-60% energy from carbohydrates, less than 222m of dietary cholesterol, 20-35 gm of total fiber daily;Long Term: Adherence to nutrition and physical activity/exercise program aimed toward attainment of established weight goal;Understanding of distribution of calorie intake throughout the day with the consumption of 4-5 meals/snacks   Tobacco Cessation Yes   Intervention Assist the participant in steps to quit. Provide individualized education and counseling about committing to Tobacco Cessation, relapse prevention, and pharmacological support that can be provided by physician.   Expected Outcomes Long Term: Complete abstinence from all tobacco products for at least 12 months from quit date.   Improve shortness of breath with ADL's Yes   Intervention Provide education, individualized exercise plan and daily activity instruction to help decrease symptoms of SOB with activities of daily living.   Expected Outcomes Short Term: Achieves a reduction of symptoms when performing activities of daily living.   Diabetes Yes    Intervention Provide education about signs/symptoms and action to take for hypo/hyperglycemia.;Provide education about proper nutrition, including hydration, and aerobic/resistive exercise prescription along with prescribed medications to achieve blood glucose in normal ranges: Fasting glucose 65-99 mg/dL   Expected Outcomes Short Term: Participant verbalizes understanding of the signs/symptoms and immediate care of hyper/hypoglycemia, proper foot care and importance of medication, aerobic/resistive exercise and nutrition plan for blood glucose control.;Long Term: Attainment of HbA1C < 7%.   Hypertension Yes   Intervention Provide education on lifestyle modifcations including regular physical activity/exercise, weight management, moderate sodium restriction and increased consumption of fresh fruit, vegetables, and low fat dairy, alcohol moderation, and smoking cessation.;Monitor prescription use compliance.   Expected Outcomes Short Term: Continued assessment and intervention until BP is < 140/960mHG in hypertensive participants. < 130/8049mG in hypertensive participants with diabetes, heart failure or chronic  kidney disease.;Long Term: Maintenance of blood pressure at goal levels.   Lipids Yes   Intervention Provide education and support for participant on nutrition & aerobic/resistive exercise along with prescribed medications to achieve LDL <19m, HDL >436m   Expected Outcomes Short Term: Participant states understanding of desired cholesterol values and is compliant with medications prescribed. Participant is following exercise prescription and nutrition guidelines.;Long Term: Cholesterol controlled with medications as prescribed, with individualized exercise RX and with personalized nutrition plan. Value goals: LDL < 7076mHDL > 40 mg.   Stress Yes   Intervention Offer individual and/or small group education and counseling on adjustment to heart disease, stress management and health-related  lifestyle change. Teach and support self-help strategies.;Refer participants experiencing significant psychosocial distress to appropriate mental health specialists for further evaluation and treatment. When possible, include family members and significant others in education/counseling sessions.   Expected Outcomes Short Term: Participant demonstrates changes in health-related behavior, relaxation and other stress management skills, ability to obtain effective social support, and compliance with psychotropic medications if prescribed.;Long Term: Emotional wellbeing is indicated by absence of clinically significant psychosocial distress or social isolation.      Core Components/Risk Factors/Patient Goals Review:    Core Components/Risk Factors/Patient Goals at Discharge (Final Review):    ITP Comments:     ITP Comments    Row Name 09/19/16 1244 09/19/16 1311 09/20/16 1323 09/26/16 0926 10/07/16 0853   ITP Comments KarKymiaports she has Lupus, Rheumatoid Arthritis, Fibryomyalgia. 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 KarDanneports that she got down to 200lbs but when all this happened she "got depressed and just ate up to 240lbs now. " KarLeashaports that she has had diabetes for 5 years.  ITP was created during Medical Review after Cardiac Rehab informed consent was signed. DX EPIC/CHL discharge summary I left KarSerafinavm that she can get a free physical therapy screening for her knee problem. She can call [phone] check in at the front desk.  KarBraxtonft a vm on the Cardiac Rehab phone vm that she would not be able to attend today since she is sick. KArDoriana wearing a 30 day holter monitor.  SHe has had a few medication changes   Row Name 10/12/16 0631           ITP Comments 30 day review. Continue with ITP unless directed changes per  Medical Director review   New to program          Comments:

## 2016-10-17 ENCOUNTER — Encounter: Payer: Self-pay | Admitting: Emergency Medicine

## 2016-10-17 ENCOUNTER — Emergency Department
Admission: EM | Admit: 2016-10-17 | Discharge: 2016-10-17 | Disposition: A | Discharge status: Home / Self Care | Payer: Managed Care, Other (non HMO) | Attending: Emergency Medicine | Admitting: Emergency Medicine

## 2016-10-17 DIAGNOSIS — Z7984 Long term (current) use of oral hypoglycemic drugs: Secondary | ICD-10-CM | POA: Insufficient documentation

## 2016-10-17 DIAGNOSIS — Z87891 Personal history of nicotine dependence: Secondary | ICD-10-CM | POA: Diagnosis not present

## 2016-10-17 DIAGNOSIS — J449 Chronic obstructive pulmonary disease, unspecified: Secondary | ICD-10-CM | POA: Insufficient documentation

## 2016-10-17 DIAGNOSIS — Z79899 Other long term (current) drug therapy: Secondary | ICD-10-CM | POA: Insufficient documentation

## 2016-10-17 DIAGNOSIS — I2510 Atherosclerotic heart disease of native coronary artery without angina pectoris: Secondary | ICD-10-CM | POA: Insufficient documentation

## 2016-10-17 DIAGNOSIS — Z7901 Long term (current) use of anticoagulants: Secondary | ICD-10-CM | POA: Insufficient documentation

## 2016-10-17 DIAGNOSIS — I10 Essential (primary) hypertension: Secondary | ICD-10-CM | POA: Insufficient documentation

## 2016-10-17 DIAGNOSIS — W19XXXD Unspecified fall, subsequent encounter: Secondary | ICD-10-CM

## 2016-10-17 DIAGNOSIS — E119 Type 2 diabetes mellitus without complications: Secondary | ICD-10-CM | POA: Diagnosis not present

## 2016-10-17 DIAGNOSIS — M545 Low back pain, unspecified: Secondary | ICD-10-CM

## 2016-10-17 LAB — URINALYSIS, ROUTINE W REFLEX MICROSCOPIC
BILIRUBIN URINE: NEGATIVE
GLUCOSE, UA: NEGATIVE mg/dL
HGB URINE DIPSTICK: NEGATIVE
KETONES UR: NEGATIVE mg/dL
LEUKOCYTES UA: NEGATIVE
Nitrite: NEGATIVE
Protein, ur: NEGATIVE mg/dL
Specific Gravity, Urine: 1.014 (ref 1.005–1.030)
pH: 5 (ref 5.0–8.0)

## 2016-10-17 MED ORDER — LIDOCAINE 5 % EX PTCH: 1.0000 | MEDICATED_PATCH | Freq: Two times a day (BID) | CUTANEOUS | 0 refills | Status: AC

## 2016-10-17 NOTE — ED Triage Notes (Signed)
Pt here after fall last week, reports right lower back pain continued. Pt reports taking robaxin and norco PTA.

## 2016-10-17 NOTE — ED Provider Notes (Signed)
[facility] Emergency Department Provider Note ____________________________________________  Time seen: 1537  I have reviewed the triage vital signs and the nursing notes.  HISTORY  Chief Complaint  Fall  HPI [name] is a 51 y.o. female return to the ED, for evaluation of continued right-sided low back pain following a mechanical fall. The patient was evaluated here on 7/17 for injury sustained a mechanical fall at home. She describes her knee giving out and she walked down the steps at home. She admitted to head injury but without loss of consciousness. Multiple x-rays and CT scans were cleared at that time. The patient has since seen her primary care provider on 7/20, and her orthopedic provider on 7/19, for a chronic complaint. She denies any interim falls, slipped, tripped. She does admit to not ambulating with her suggested walker or cane due to her chronic right knee meniscal injury. She presents today with continued right sided thoracolumbar tenderness to palpation. She is dosing Robaxin and hydrocodone as prescribed by her primary care provider. Patient has multiple drug allergies and intolerances to multiple antispasm and anti-inflammatory medications.  Past Medical History:  Diagnosis Date  . CAD (coronary artery disease)    per cardiac MRI  . Diabetes mellitus without complication (HCC)   . Hypertension   . Lupus (systemic lupus erythematosus) (HCC)     Patient Active Problem List   Diagnosis Date Noted  . 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 Atrial fibrillation (HCC) 07/02/2016  . Anxiety, generalized 06/27/2016  . Chest pain 04/21/2016  . 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 ASHD (arteriosclerotic heart disease) 11/02/2011  . Chronic back pain 11/02/2011    Past Surgical History:  Procedure Laterality Date  . APPENDECTOMY    . CESAREAN SECTION    . KNEE ARTHROSCOPY    .  TONSILLECTOMY    . VALVE REPLACEMENT      Prior to Admission medications   Medication Sig Start Date End Date Taking? Authorizing Provider  acetaminophen (TYLENOL) 500 MG tablet Take 500 mg by mouth every 6 (six) hours as needed.    [provider]  acetaminophen-codeine (TYLENOL #3) 300-30 MG tablet TAKE 1-2 TABLET EVERY 8HOURS AS NEEDED FOR PAIN, MAX 4 TABS/DAY 07/21/16   [provider]  albuterol (PROVENTIL HFA;VENTOLIN HFA) 108 (90 Base) MCG/ACT inhaler Inhale 1-2 puffs into the lungs every 6 (six) hours as needed for wheezing or shortness of breath.    [provider]  amLODipine (NORVASC) 5 MG tablet Take 5 mg by mouth daily.    [provider]  aspirin 81 MG tablet Take 81 mg by mouth daily.    [provider]  baclofen (LIORESAL) 10 MG tablet Take 10 mg by mouth 2 (two) times daily.    [provider]  budesonide-formoterol (SYMBICORT) 160-4.5 MCG/ACT inhaler Inhale 2 puffs into the lungs 2 (two) times daily. 01/14/16 01/13/17  [provider]  budesonide-formoterol (SYMBICORT) 160-4.5 MCG/ACT inhaler Inhale into the lungs. 01/14/16 01/13/17  [provider]  dicyclomine (BENTYL) 20 MG tablet Take 1 tablet (20 mg total) by mouth 3 (three) times daily as needed for spasms. Patient not taking: Reported on 04/21/2016 05/14/15 05/13/16  Myrna BlazerSchaevitz, David Matthew, MD  FLUoxetine (PROZAC) 40 MG capsule Take 40 mg by mouth 2 (two) times daily.     [provider]  furosemide (LASIX) 40 MG tablet Take by mouth. 07/18/16 07/18/17  [provider]  ibuprofen (ADVIL,MOTRIN) 600 MG tablet Take 1 tablet (600 mg total) by mouth 3 (three) times daily. Patient not taking: Reported on 09/19/2016 04/21/16   Adrian Saran, MD  lidocaine (LIDODERM) 5 % Place 1 patch onto the skin every 12 (twelve) hours. Remove & Discard patch within 12 hours or as directed by MD 10/17/16 10/17/17  Harshil Cavallaro, Charlesetta Ivory, PA-C  metFORMIN  (GLUCOPHAGE) 500 MG tablet Take by mouth 2 (two) times daily with a meal.    [provider]  methocarbamol (ROBAXIN) 500 MG tablet Take 1 tablet (500 mg total) by mouth 2 (two) times daily. Patient not taking: Reported on 10/11/2016 01/06/15   Payton Mccallum, MD  metoprolol tartrate (LOPRESSOR) 25 MG tablet Take 25 mg by mouth 2 (two) times daily.    [provider]  omeprazole (PRILOSEC) 40 MG capsule Take 40 mg by mouth daily.    [provider]  potassium chloride SA (K-DUR,KLOR-CON) 20 MEQ tablet Take 20 mEq by mouth once.    [provider]  pravastatin (PRAVACHOL) 20 MG tablet Take 20 mg by mouth daily.    [provider]  ranitidine (ZANTAC) 300 MG tablet Take 300 mg by mouth at bedtime.    [provider]  sennosides-docusate sodium (SENOKOT-S) 8.6-50 MG tablet Take 1 tablet by mouth at bedtime.    [provider]  Vitamin D, Ergocalciferol, (DRISDOL) 50000 units CAPS capsule Take 50,000 Units by mouth every 7 (seven) days.    [provider]  warfarin (COUMADIN) 5 MG tablet Take 5 mg by mouth daily.    [provider]    Allergies Flexeril [cyclobenzaprine]; Celecoxib; Gabapentin; Iodinated diagnostic agents; Morphine and related; Percocet [oxycodone-acetaminophen]; Skelaxin [metaxalone]; Demerol [meperidine]; Nortriptyline; Tramadol; Ketorolac; and Norflex [orphenadrine citrate]  Family History  Problem Relation Age of Onset  . Hyperlipidemia Mother   . Hypertension Father   . Diabetes Father   . Hyperlipidemia Father     Social History Social History  Substance Use Topics  . Smoking status: Former Smoker    Packs/day: 0.50    Years: 15.00    Types: Cigarettes    Quit date: 06/26/2016  . Smokeless tobacco: Never Used  . Alcohol use No    Review of Systems  Constitutional: Negative for fever. Cardiovascular: Negative for chest pain. Respiratory: Negative for shortness of  breath. Gastrointestinal: Negative for abdominal pain, vomiting and diarrhea. Genitourinary: Negative for dysuria. Musculoskeletal: Positive for back pain. Skin: Negative for rash. Neurological: Negative for headaches, focal weakness or numbness. ____________________________________________  PHYSICAL EXAM:  VITAL SIGNS: ED Triage Vitals  Enc Vitals Group     BP 10/17/16 1501 135/74     Pulse Rate 10/17/16 1501 71     Resp 10/17/16 1501 20     Temp 10/17/16 1501 99.2 F (37.3 C)     Temp Source 10/17/16 1501 Oral     SpO2 10/17/16 1501 99 %     Weight 10/17/16 1500 246 lb (111.6 kg)     Height 10/17/16 1500 4\' 11"  (1.499 m)     Head Circumference --      Peak Flow --      Pain Score 10/17/16 1500 8     Pain Loc --      Pain Edu? --      Excl. in GC? --     Constitutional: Alert and oriented. Well appearing and in no distress. Head: Normocephalic and atraumatic. Eyes: Conjunctivae are normal. Normal extraocular movements Neck: Supple. No  thyromegaly. Cardiovascular: Normal rate, regular rhythm. Normal distal pulses. Respiratory: Normal respiratory effort. No wheezes/rales/rhonchi. Gastrointestinal: Protuberant, but soft and nontender. No distention. Musculoskeletal: No spinal alignment without midline tenderness, spasm, deformity, or step-off. Patient without any obvious bruising, ecchymosis, edema, or abrasions noted to the posterior back or lumbar sacral junction. She is tender to light palpation over the right thoracolumbar region. Nontender with normal range of motion in all extremities.  Neurologic:  Normal gait without ataxia. Normal speech and language. No gross focal neurologic deficits are appreciated. Skin:  Skin is warm, dry and intact. No rash noted. Psychiatric: Mood and affect are normal. Patient exhibits appropriate insight and judgment. ____________________________________________  LABORATORY  Labs Reviewed  URINALYSIS, ROUTINE W REFLEX MICROSCOPIC -  Abnormal; Notable for the following:       Result Value   Color, Urine YELLOW (*)    APPearance CLEAR (*)    All other components within normal limits  ____________________________________________  INITIAL IMPRESSION / ASSESSMENT AND PLAN / ED COURSE  Patient ED evaluation of continued muscle skeletal pain following mechanical fall 1 week prior. Patient was seen by she advised dosed Tylenol and addition to Robaxin and her hydrocodone for pain. Patient is found to have negative urinalysis today despite her right-sided flank and soft tissue pain. She was discharged with a prescription for Lidoderm patches to apply topically for pain relief. She should continue her home medications as prescribed. She should follow with primary care provider for ongoing and intermittent pain management. ____________________________________________  FINAL CLINICAL IMPRESSION(S) / ED DIAGNOSES  Final diagnoses:  Fall, subsequent encounter  Acute right-sided low back pain without sciatica      Lissa Hoard, PA-C 10/17/16 1651    Nita Sickle, MD 10/17/16 2028

## 2016-10-17 NOTE — Discharge Instructions (Signed)
Your exam and lab were essentially normal today. You may continue to experience generalized pain and muscle soreness following your fall, for several more days to weeks. Take your prescription meds as directed. Apply moist heat and/or cold compresses to reduce pain. Use the lidoderm patches as directed. Follow-up with Dr. Greggory StallionGeorge as planned.

## 2016-10-17 NOTE — ED Notes (Signed)
See triage note   States she is still having pain to back  States she had a fall last week  Ambulates slowly d/t pain

## 2016-10-24 ENCOUNTER — Encounter: Payer: Managed Care, Other (non HMO) | Admitting: *Deleted

## 2016-10-24 DIAGNOSIS — Z954 Presence of other heart-valve replacement: Secondary | ICD-10-CM | POA: Diagnosis not present

## 2016-10-24 DIAGNOSIS — Z952 Presence of prosthetic heart valve: Secondary | ICD-10-CM

## 2016-10-24 LAB — GLUCOSE, CAPILLARY
Glucose-Capillary: 125 mg/dL — ABNORMAL HIGH (ref 65–99)
Glucose-Capillary: 107 mg/dL — ABNORMAL HIGH (ref 65–99)

## 2016-10-24 NOTE — Progress Notes (Signed)
Daily Session Note  Patient Details  Name: [name] MRN: [id number] Date of Birth: [birth year] Referring Provider:     Cardiac Rehab from 09/19/2016 in [facility] Cardiac and Pulmonary Rehab  Referring Provider  Ward, Jeani Hawking MD      Encounter Date: 10/24/2016  Check In:     Session Check In - 10/24/16 0752      Check-In   Location [facility]-Cardiac & Pulmonary Rehab   Staff Present Alberteen Sam, MA, ACSM RCEP, Exercise Physiologist;Carroll Enterkin, RN, Moises Blood, BS, ACSM CEP, Exercise Physiologist   Supervising physician immediately available to respond to emergencies See telemetry face sheet for immediately available ER MD   Medication changes reported     No   Fall or balance concerns reported    Yes   Comments [place] at home a couple of weeks ago down back stairs.  Everything was cleared   Warm-up and Cool-down Performed on first and last piece of equipment   Resistance Training Performed Yes   VAD Patient? No     Pain Assessment   Currently in Pain? No/denies   Multiple Pain Sites No         History  Smoking Status  . Former Smoker  . Packs/day: 0.50  . Years: 15.00  . Types: Cigarettes  . Quit date: 06/26/2016  Smokeless Tobacco  . Never Used    Goals Met:  Independence with exercise equipment Exercise tolerated well No report of cardiac concerns or symptoms Strength training completed today  Goals Unmet:  Not Applicable  Comments: Pt able to follow exercise prescription today without complaint.  Will continue to monitor for progression.    Dr. Emily Filbert is Medical Director for [place] and [facility].

## 2016-10-26 ENCOUNTER — Encounter: Payer: Managed Care, Other (non HMO) | Attending: Internal Medicine

## 2016-10-26 DIAGNOSIS — Z954 Presence of other heart-valve replacement: Secondary | ICD-10-CM | POA: Diagnosis present

## 2016-10-26 DIAGNOSIS — Z87891 Personal history of nicotine dependence: Secondary | ICD-10-CM | POA: Diagnosis not present

## 2016-10-26 DIAGNOSIS — I10 Essential (primary) hypertension: Secondary | ICD-10-CM | POA: Diagnosis not present

## 2016-10-26 DIAGNOSIS — I2510 Atherosclerotic heart disease of native coronary artery without angina pectoris: Secondary | ICD-10-CM | POA: Insufficient documentation

## 2016-10-26 DIAGNOSIS — E119 Type 2 diabetes mellitus without complications: Secondary | ICD-10-CM | POA: Diagnosis not present

## 2016-10-26 DIAGNOSIS — M329 Systemic lupus erythematosus, unspecified: Secondary | ICD-10-CM | POA: Diagnosis not present

## 2016-10-26 DIAGNOSIS — Z952 Presence of prosthetic heart valve: Secondary | ICD-10-CM

## 2016-10-26 NOTE — Progress Notes (Signed)
Daily Session Note  Patient Details  Name: [name] MRN: [id number] Date of Birth: Jan 02, 1966 Referring Provider:     Cardiac Rehab from 09/19/2016 in [facility] Cardiac and Pulmonary Rehab  Referring Provider  Ward, Jeani Hawking MD      Encounter Date: 10/26/2016  Check In:     Session Check In - 10/26/16 0759      Check-In   Location [facility]-Cardiac & Pulmonary Rehab   Staff Present Heath Lark, RN, BSN, CCRP;Jessica Luan Pulling, MA, ACSM RCEP, Exercise Physiologist;Charitie Hinote Flavia Shipper   Supervising physician immediately available to respond to emergencies See telemetry face sheet for immediately available ER MD   Medication changes reported     No   Fall or balance concerns reported    No   Warm-up and Cool-down Performed on first and last piece of equipment   Resistance Training Performed Yes   VAD Patient? No     Pain Assessment   Currently in Pain? No/denies   Multiple Pain Sites No         History  Smoking Status  . Former Smoker  . Packs/day: 0.50  . Years: 15.00  . Types: Cigarettes  . Quit date: 06/26/2016  Smokeless Tobacco  . Never Used    Goals Met:  Independence with exercise equipment Exercise tolerated well No report of cardiac concerns or symptoms Strength training completed today  Goals Unmet:  Not Applicable  Comments: Pt able to follow exercise prescription today without complaint.  Will continue to monitor for progression.   Dr. Emily Filbert is Medical Director for [place] and [facility].

## 2016-10-31 ENCOUNTER — Encounter: Payer: Managed Care, Other (non HMO) | Admitting: *Deleted

## 2016-10-31 DIAGNOSIS — Z954 Presence of other heart-valve replacement: Secondary | ICD-10-CM | POA: Diagnosis not present

## 2016-10-31 DIAGNOSIS — Z952 Presence of prosthetic heart valve: Secondary | ICD-10-CM

## 2016-10-31 NOTE — Progress Notes (Signed)
Daily Session Note  Patient Details  Name: Brandilyn Nanninga MRN: [id number] Date of Birth: Nov 07, 1965 Referring Provider:     Cardiac Rehab from 09/19/2016 in [facility] Cardiac and Pulmonary Rehab  Referring Provider  Ward, Jeani Hawking MD      Encounter Date: 10/31/2016  Check In:     Session Check In - 10/31/16 0756      Check-In   Location [facility]-Cardiac & Pulmonary Rehab   Staff Present Gerlene Burdock, RN, Levie Heritage, MA, ACSM RCEP, Exercise Physiologist;Kelly Amedeo Plenty, BS, ACSM CEP, Exercise Physiologist   Supervising physician immediately available to respond to emergencies See telemetry face sheet for immediately available ER MD   Medication changes reported     No   Fall or balance concerns reported    No   Warm-up and Cool-down Performed on first and last piece of equipment   Resistance Training Performed Yes   VAD Patient? No     Pain Assessment   Currently in Pain? No/denies   Multiple Pain Sites No         History  Smoking Status  . Former Smoker  . Packs/day: 0.50  . Years: 15.00  . Types: Cigarettes  . Quit date: 06/26/2016  Smokeless Tobacco  . Never Used    Goals Met:  Independence with exercise equipment Exercise tolerated well No report of cardiac concerns or symptoms Strength training completed today  Goals Unmet:  Not Applicable  Comments: Pt able to follow exercise prescription today without complaint.  Will continue to monitor for progression.    Dr. Emily Filbert is Medical Director for [place] and [facility].

## 2016-11-02 ENCOUNTER — Encounter: Payer: Managed Care, Other (non HMO) | Admitting: *Deleted

## 2016-11-02 DIAGNOSIS — Z952 Presence of prosthetic heart valve: Secondary | ICD-10-CM

## 2016-11-02 DIAGNOSIS — Z954 Presence of other heart-valve replacement: Secondary | ICD-10-CM | POA: Diagnosis not present

## 2016-11-02 NOTE — Progress Notes (Signed)
Daily Session Note  Patient Details  Name: Tanashia Ciesla MRN: [id number] Date of Birth: [birth year] Referring Provider:     Cardiac Rehab from 09/19/2016 in [facility] Cardiac and Pulmonary Rehab  Referring Provider  Ward, Jeani Hawking MD      Encounter Date: 11/02/2016  Check In:     Session Check In - 11/02/16 0808      Check-In   Location [facility]-Cardiac & Pulmonary Rehab   Staff Present Alberteen Sam, MA, ACSM RCEP, Exercise Physiologist;Joseph Tessie Fass RCP,RRT,BSRT;Donika Butner, RN, BSN   Supervising physician immediately available to respond to emergencies See telemetry face sheet for immediately available ER MD   Medication changes reported     No   Fall or balance concerns reported    No   Warm-up and Cool-down Performed on first and last piece of equipment   Resistance Training Performed Yes   VAD Patient? No     Pain Assessment   Currently in Pain? No/denies   Multiple Pain Sites No         History  Smoking Status  . Former Smoker  . Packs/day: 0.50  . Years: 15.00  . Types: Cigarettes  . Quit date: 06/26/2016  Smokeless Tobacco  . Never Used    Goals Met:  Proper associated with RPD/PD & O2 Sat Exercise tolerated well No report of cardiac concerns or symptoms Strength training completed today  Goals Unmet:  Not Applicable  Comments:     Dr. Emily Filbert is Medical Director for [place] and [facility].

## 2016-11-04 ENCOUNTER — Encounter: Payer: Managed Care, Other (non HMO) | Admitting: *Deleted

## 2016-11-04 DIAGNOSIS — Z952 Presence of prosthetic heart valve: Secondary | ICD-10-CM

## 2016-11-04 DIAGNOSIS — Z954 Presence of other heart-valve replacement: Secondary | ICD-10-CM | POA: Diagnosis not present

## 2016-11-04 NOTE — Progress Notes (Signed)
Daily Session Note  Patient Details  Name: [name] MRN: [id number] Date of Birth: 11-Dec-1965 Referring Provider:     Cardiac Rehab from 09/19/2016 in [facility] Cardiac and Pulmonary Rehab  Referring Provider  Ward, Jeani Hawking MD      Encounter Date: 11/04/2016  Check In:     Session Check In - 11/04/16 0753      Check-In   Location [facility]-Cardiac & Pulmonary Rehab   Staff Present Alberteen Sam, MA, ACSM RCEP, Exercise Physiologist;Susanne Bice, RN, BSN, CCRP;Joseph Flavia Shipper   Supervising physician immediately available to respond to emergencies See telemetry face sheet for immediately available ER MD   Medication changes reported     No   Fall or balance concerns reported    No   Warm-up and Cool-down Performed on first and last piece of equipment   Resistance Training Performed No  left early for appointment   VAD Patient? No     Pain Assessment   Currently in Pain? No/denies   Multiple Pain Sites No           Exercise Prescription Changes - 11/04/16 0900      Response to Exercise   Blood Pressure (Admit) 126/64   Blood Pressure (Exercise) 124/64   Blood Pressure (Exit) 124/70   Heart Rate (Admit) 76 bpm   Heart Rate (Exercise) 101 bpm   Heart Rate (Exit) 72 bpm   Rating of Perceived Exertion (Exercise) 14   Symptoms none   Duration Continue with 45 min of aerobic exercise without signs/symptoms of physical distress.   Intensity THRR unchanged     Progression   Progression Continue to progress workloads to maintain intensity without signs/symptoms of physical distress.   Average METs 2.69     Resistance Training   Training Prescription Yes   Weight 4 lbs   Reps 10-15     Interval Training   Interval Training No     Treadmill   MPH 1.8   Grade 2   Minutes 15   METs 2.87     NuStep   Level 5   Minutes 15   METs 3.2     Biostep-RELP   Level 3   Minutes 15   METs 2     Home Exercise Plan   Plans to continue exercise at Home (comment)   walking and going to water aerobics   Frequency Add 2 additional days to program exercise sessions.   Initial Home Exercises Provided 10/07/16      History  Smoking Status  . Former Smoker  . Packs/day: 0.50  . Years: 15.00  . Types: Cigarettes  . Quit date: 06/26/2016  Smokeless Tobacco  . Never Used    Goals Met:  Independence with exercise equipment Exercise tolerated well No report of cardiac concerns or symptoms Strength training completed today  Goals Unmet:  Not Applicable  Comments: Pt able to follow exercise prescription today without complaint.  Will continue to monitor for progression.    Dr. Emily Filbert is Medical Director for [place] and [facility].

## 2016-11-07 ENCOUNTER — Encounter: Payer: Managed Care, Other (non HMO) | Admitting: *Deleted

## 2016-11-07 DIAGNOSIS — Z952 Presence of prosthetic heart valve: Secondary | ICD-10-CM

## 2016-11-07 DIAGNOSIS — Z954 Presence of other heart-valve replacement: Secondary | ICD-10-CM | POA: Diagnosis not present

## 2016-11-07 NOTE — Progress Notes (Signed)
Daily Session Note  Patient Details  Name: [name] MRN: [id number] Date of Birth: 04/13/1965 Referring Provider:     Cardiac Rehab from 09/19/2016 in [facility]  Referring Provider  Ward, Jeani Hawking MD      Encounter Date: 11/07/2016  Check In:     Session Check In - 11/07/16 0801      Check-In   Location [facility]   Staff Present Alberteen Sam, MA, ACSM RCEP, Exercise Physiologist;Carroll Enterkin, RN, Moises Blood, BS, ACSM CEP, Exercise Physiologist   Supervising physician immediately available to respond to emergencies See telemetry face sheet for immediately available ER MD   Medication changes reported     No   Fall or balance concerns reported    No   Warm-up and Cool-down Performed on first and last piece of equipment   Resistance Training Performed Yes   VAD Patient? No     Pain Assessment   Multiple Pain Sites No         History  Smoking Status  . Former Smoker  . Packs/day: 0.50  . Years: 15.00  . Types: Cigarettes  . Quit date: 06/26/2016  Smokeless Tobacco  . Never Used    Goals Met:  Independence with exercise equipment Exercise tolerated well No report of cardiac concerns or symptoms Strength training completed today  Goals Unmet:  Not Applicable  Comments: Pt able to follow exercise prescription today without complaint.  Will continue to monitor for progression.    Dr. Emily Filbert is Medical Director for [facility].

## 2016-11-09 ENCOUNTER — Encounter: Payer: Self-pay | Admitting: *Deleted

## 2016-11-09 DIAGNOSIS — Z952 Presence of prosthetic heart valve: Secondary | ICD-10-CM

## 2016-11-09 NOTE — Progress Notes (Signed)
Cardiac Individual Treatment Plan  Patient Details  Name: [name] MRN: [id number] Date of Birth: 09/28/1965 Referring Provider:     Cardiac Rehab from 09/19/2016 in [facility] Cardiac [facility] Rehab  Referring Provider  Ward, Jeani Hawking MD      Initial Encounter Date:    Cardiac Rehab from 09/19/2016 in [facility] Cardiac and Pulmonary Rehab  Date  09/19/16  Referring Provider  Ward, Jeani Hawking MD      Visit Diagnosis: Aortic valve replaced  Patient's Home Medications on Admission:  Current Outpatient Prescriptions:  .  acetaminophen (TYLENOL) 500 MG tablet, Take 500 mg by mouth every 6 (six) hours as needed., Disp: , Rfl:  .  acetaminophen-codeine (TYLENOL #3) 300-30 MG tablet, TAKE 1-2 TABLET EVERY 8HOURS AS NEEDED FOR PAIN, MAX 4 TABS/DAY, Disp: , Rfl:  .  albuterol (PROVENTIL HFA;VENTOLIN HFA) 108 (90 Base) MCG/ACT inhaler, Inhale 1-2 puffs into the lungs every 6 (six) hours as needed for wheezing or shortness of breath., Disp: , Rfl:  .  amLODipine (NORVASC) 5 MG tablet, Take 5 mg by mouth daily., Disp: , Rfl:  .  aspirin 81 MG tablet, Take 81 mg by mouth daily., Disp: , Rfl:  .  baclofen (LIORESAL) 10 MG tablet, Take 10 mg by mouth 2 (two) times daily., Disp: , Rfl:  .  budesonide-formoterol (SYMBICORT) 160-4.5 MCG/ACT inhaler, Inhale 2 puffs into the lungs 2 (two) times daily., Disp: , Rfl:  .  budesonide-formoterol (SYMBICORT) 160-4.5 MCG/ACT inhaler, Inhale into the lungs., Disp: , Rfl:  .  dicyclomine (BENTYL) 20 MG tablet, Take 1 tablet (20 mg total) by mouth 3 (three) times daily as needed for spasms. (Patient not taking: Reported on 04/21/2016), Disp: 15 tablet, Rfl: 0 .  FLUoxetine (PROZAC) 40 MG capsule, Take 40 mg by mouth 2 (two) times daily. , Disp: , Rfl:  .  furosemide (LASIX) 40 MG tablet, Take by mouth., Disp: , Rfl:  .  ibuprofen (ADVIL,MOTRIN) 600 MG tablet, Take 1 tablet (600 mg total) by mouth 3 (three) times daily. (Patient not taking: Reported on 09/19/2016), Disp: 30  tablet, Rfl: 0 .  lidocaine (LIDODERM) 5 %, Place 1 patch onto the skin every 12 (twelve) hours. Remove & Discard patch within 12 hours or as directed by MD, Disp: 10 patch, Rfl: 0 .  metFORMIN (GLUCOPHAGE) 500 MG tablet, Take by mouth 2 (two) times daily with a meal., Disp: , Rfl:  .  methocarbamol (ROBAXIN) 500 MG tablet, Take 1 tablet (500 mg total) by mouth 2 (two) times daily. (Patient not taking: Reported on 10/11/2016), Disp: 20 tablet, Rfl: 0 .  metoprolol tartrate (LOPRESSOR) 25 MG tablet, Take 25 mg by mouth 2 (two) times daily., Disp: , Rfl:  .  omeprazole (PRILOSEC) 40 MG capsule, Take 40 mg by mouth daily., Disp: , Rfl:  .  potassium chloride SA (K-DUR,KLOR-CON) 20 MEQ tablet, Take 20 mEq by mouth once., Disp: , Rfl:  .  pravastatin (PRAVACHOL) 20 MG tablet, Take 20 mg by mouth daily., Disp: , Rfl:  .  ranitidine (ZANTAC) 300 MG tablet, Take 300 mg by mouth at bedtime., Disp: , Rfl:  .  sennosides-docusate sodium (SENOKOT-S) 8.6-50 MG tablet, Take 1 tablet by mouth at bedtime., Disp: , Rfl:  .  Vitamin D, Ergocalciferol, (DRISDOL) 50000 units CAPS capsule, Take 50,000 Units by mouth every 7 (seven) days., Disp: , Rfl:  .  warfarin (COUMADIN) 5 MG tablet, Take 5 mg by mouth daily., Disp: , Rfl:   Past Medical History:  Past Medical History:  Diagnosis Date  . CAD (coronary artery disease)    per cardiac MRI  . Diabetes mellitus without complication ([place])   . Hypertension   . Lupus (systemic lupus erythematosus) ([facility])     Tobacco Use: History  Smoking Status  . Former Smoker  . Packs/day: 0.50  . Years: 15.00  . Types: Cigarettes  . Quit date: 06/26/2016  Smokeless Tobacco  . Never Used    Labs: Recent Review Flowsheet Data    Labs for ITP Cardiac and Pulmonary Rehab Latest Ref Rng & Units 04/21/2016   Hemoglobin A1c 4.8 - 5.6 % 5.4       Exercise Target Goals:    Exercise Program Goal: Individual exercise prescription set with THRR, safety & activity barriers.  Participant demonstrates ability to understand and report RPE using BORG scale, to self-measure pulse accurately, and to acknowledge the importance of the exercise prescription.  Exercise Prescription Goal: Starting with aerobic activity 30 plus minutes a day, 3 days per week for initial exercise prescription. Provide home exercise prescription and guidelines that participant acknowledges understanding prior to discharge.  Activity Barriers & Risk Stratification:     Activity Barriers & Cardiac Risk Stratification - 09/19/16 1217      Activity Barriers & Cardiac Risk Stratification   Activity Barriers Arthritis;Fibromyalgia;Shortness of Breath;Back Problems;Joint Problems;Deconditioning;Muscular Weakness;Balance Concerns;History of Falls  spinal stenosis, r knee meniscus tear, lupus, OA, RA   Cardiac Risk Stratification High      6 Minute Walk:     6 Minute Walk    Row Name 09/19/16 1425         6 Minute Walk   Phase Initial     Distance 1075 feet     Walk Time 6 minutes     # of Rest Breaks 0     MPH 2.04     METS 2.8     RPE 12     Perceived Dyspnea  1     VO2 Peak 9.82     Symptoms Yes (comment)     Comments knee pain 8/10 slightly SOB     Resting HR 73 bpm     Resting BP 140/70     Max Ex. HR 111 bpm     Max Ex. BP 164/74     2 Minute Post BP 126/72        Oxygen Initial Assessment:   Oxygen Re-Evaluation:   Oxygen Discharge (Final Oxygen Re-Evaluation):   Initial Exercise Prescription:     Initial Exercise Prescription - 09/19/16 1400      Date of Initial Exercise RX and Referring Provider   Date 09/19/16   Referring Provider Ward, Jeani Hawking MD     Treadmill   MPH 1.7   Grade 0.5   Minutes 15   METs 2.42     NuStep   Level 2   SPM 80   Minutes 15   METs 2.5     Biostep-RELP   Level 2   SPM 50   Minutes 15   METs 2     Prescription Details   Frequency (times per week) 3   Duration Progress to 45 minutes of aerobic exercise without  signs/symptoms of physical distress     Intensity   THRR 40-80% of Max Heartrate 112-151   Ratings of Perceived Exertion 11-15   Perceived Dyspnea 0-4     Progression   Progression Continue to progress workloads to maintain intensity without signs/symptoms of physical distress.  Resistance Training   Training Prescription Yes   Weight 3 lbs   Reps 10-15      Perform Capillary Blood Glucose checks as needed.  Exercise Prescription Changes:     Exercise Prescription Changes    Row Name 09/19/16 1400 10/04/16 1500 10/20/16 1100 11/04/16 0900       Response to Exercise   Blood Pressure (Admit) 140/70 122/82 132/77 126/64    Blood Pressure (Exercise) 144/74 138/82 124/70 124/64    Blood Pressure (Exit) 126/72 142/80 122/70 124/70    Heart Rate (Admit) 73 bpm 80 bpm 76 bpm 76 bpm    Heart Rate (Exercise) 111 bpm 105 bpm 110 bpm 101 bpm    Heart Rate (Exit) 78 bpm 79 bpm 77 bpm 72 bpm    Oxygen Saturation (Admit) 100 %  -  -  -    Oxygen Saturation (Exercise) 100 %  -  -  -    Rating of Perceived Exertion (Exercise) 12  - 13 14    Perceived Dyspnea (Exercise) 1  -  -  -    Symptoms knee pain 8/10, slightly SOB none none none    Comments walk test results  -  -  -    Duration  - Progress to 45 minutes of aerobic exercise without signs/symptoms of physical distress Progress to 45 minutes of aerobic exercise without signs/symptoms of physical distress Continue with 45 min of aerobic exercise without signs/symptoms of physical distress.    Intensity  - THRR unchanged THRR unchanged THRR unchanged      Progression   Progression  - Continue to progress workloads to maintain intensity without signs/symptoms of physical distress. Continue to progress workloads to maintain intensity without signs/symptoms of physical distress. Continue to progress workloads to maintain intensity without signs/symptoms of physical distress.    Average METs  - 2.27 2.94 2.69      Resistance Training    Training Prescription  - Yes Yes Yes    Weight  - 3 lb 3 lb 4 lbs    Reps  - 10-15 10-15 10-15      Interval Training   Interval Training  - No No No      Treadmill   MPH  - 1.7 1.7 1.8    Grade  - 0.5 0.5 2    Minutes  - '15 15 15    '$ METs  - 2.54 2.54 2.87      NuStep   Level  -  - 5 5    Minutes  -  - 15 15    METs  -  - 3.4 3.2      Biostep-RELP   Level  - '3 3 3    '$ SPM  - 50  -  -    Minutes  - '15 15 15    '$ METs  - '2 3 2      '$ Home Exercise Plan   Plans to continue exercise at  -  - Home (comment)  walking and going to water aerobics Home (comment)  walking and going to water aerobics    Frequency  -  - Add 2 additional days to program exercise sessions. Add 2 additional days to program exercise sessions.    Initial Home Exercises Provided  -  - 10/07/16 10/07/16       Exercise Comments:     Exercise Comments    Row Name 09/21/16 [phone]  Exercise Comments  First full day of exercise!  Patient was oriented to gym and equipment including functions, settings, policies, and procedures.  Patient's individual exercise prescription and treatment plan were reviewed.  All starting workloads were established based on the results of the 6 minute walk test done at initial orientation visit.  The plan for exercise progression was also introduced and progression will be customized based on patient's performance and goals.          Exercise Goals and Review:     Exercise Goals    Row Name 09/19/16 1434             Exercise Goals   Increase Physical Activity Yes       Intervention Provide advice, education, support and counseling about physical activity/exercise needs.;Develop an individualized exercise prescription for aerobic and resistive training based on initial evaluation findings, risk stratification, comorbidities and participant's personal goals.       Expected Outcomes Achievement of increased cardiorespiratory fitness and enhanced flexibility, muscular  endurance and strength shown through measurements of functional capacity and personal statement of participant.       Increase Strength and Stamina Yes       Intervention Provide advice, education, support and counseling about physical activity/exercise needs.;Develop an individualized exercise prescription for aerobic and resistive training based on initial evaluation findings, risk stratification, comorbidities and participant's personal goals.       Expected Outcomes Achievement of increased cardiorespiratory fitness and enhanced flexibility, muscular endurance and strength shown through measurements of functional capacity and personal statement of participant.          Exercise Goals Re-Evaluation :     Exercise Goals Re-Evaluation    Row Name 10/04/16 1544 10/20/16 1137 10/26/16 0903 11/04/16 0913       Exercise Goal Re-Evaluation   Exercise Goals Review Increase Physical Activity;Increase Strenth and Stamina Increase Physical Activity;Increase Strenth and Stamina Increase Physical Activity;Increase Strenth and Stamina Increase Physical Activity;Increase Strenth and Stamina    Comments Zabdi has tolerated exercise well on her first sessions. Mallorey has been out with various things including a fall last week.  Her last session was 7/16.  We will try to pick her back up and make progress once she is cleared to return. Since her fall, Emika has not been able to walk as much.  Her knee often feels that it is going to give away.  She did go out to buy a cane to help support her knee.  She has been using 3lbs weights at home to maintain strength.  However, Vaniyah has been able to tell how much she missed while she was out for her fall.  She is glad to be back in rehab.  She does have some trouble with her neck and we talked about looking into massage therapy now and physical therapy after she graduates. Rosela has been doing well in rehab.  She is getting back into the swing of things since her fall. She is  still having some difficulty with knee pain still.  We will continue to monitor her progression.    Expected Outcomes Short - attend class on a regular basis Long - Improve functional fitness level Short: Return to class regularly.  Long: Continue to increase strength and stamina.  Short:  Look into massage therapy for her neck.  Long: Continue to work on increasing phsycial activity at home. Short: Continue to build back up workloads.  Long: Increase activity at home.  Discharge Exercise Prescription (Final Exercise Prescription Changes):     Exercise Prescription Changes - 11/04/16 0900      Response to Exercise   Blood Pressure (Admit) 126/64   Blood Pressure (Exercise) 124/64   Blood Pressure (Exit) 124/70   Heart Rate (Admit) 76 bpm   Heart Rate (Exercise) 101 bpm   Heart Rate (Exit) 72 bpm   Rating of Perceived Exertion (Exercise) 14   Symptoms none   Duration Continue with 45 min of aerobic exercise without signs/symptoms of physical distress.   Intensity THRR unchanged     Progression   Progression Continue to progress workloads to maintain intensity without signs/symptoms of physical distress.   Average METs 2.69     Resistance Training   Training Prescription Yes   Weight 4 lbs   Reps 10-15     Interval Training   Interval Training No     Treadmill   MPH 1.8   Grade 2   Minutes 15   METs 2.87     NuStep   Level 5   Minutes 15   METs 3.2     Biostep-RELP   Level 3   Minutes 15   METs 2     Home Exercise Plan   Plans to continue exercise at Home (comment)  walking and going to water aerobics   Frequency Add 2 additional days to program exercise sessions.   Initial Home Exercises Provided 10/07/16      Nutrition:  Target Goals: Understanding of nutrition guidelines, daily intake of sodium '1500mg'$ , cholesterol '200mg'$ , calories 30% from fat and 7% or less from saturated fats, daily to have 5 or more servings of fruits and  vegetables.  Biometrics:     Pre Biometrics - 09/19/16 1434      Pre Biometrics   Height 5' (1.524 m)   Weight 241 lb 14.4 oz (109.7 kg)   Waist Circumference 45.5 inches   Hip Circumference 56 inches   Waist to Hip Ratio 0.81 %   BMI (Calculated) 47.3   Single Leg Stand 0.98 seconds       Nutrition Therapy Plan and Nutrition Goals:     Nutrition Therapy & Goals - 10/26/16 0923      Nutrition Therapy   RD appointment defered Yes      Nutrition Discharge: Rate Your Plate Scores:     Nutrition Assessments - 09/19/16 1218      MEDFICTS Scores   Pre Score 86      Nutrition Goals Re-Evaluation:     Nutrition Goals Re-Evaluation    Row Name 10/26/16 0923             Goals   Comment Milania continues to decline a nutrition appointment.  She states that she knows what to eat and how to stick with a diet.  She admits to cheating her diet twice a week with tacos from her boyfriend's food truck.  She knows that she is retaining some fluid and is trying to stay away from salt and read labels to help.       Expected Outcome Short: Continue to read labels and watch salt.  Long: Continue with heart healthy diet.           Nutrition Goals Discharge (Final Nutrition Goals Re-Evaluation):     Nutrition Goals Re-Evaluation - 10/26/16 0923      Goals   Comment Hagan continues to decline a nutrition appointment.  She states that she knows what to eat and  how to stick with a diet.  She admits to cheating her diet twice a week with tacos from her boyfriend's food truck.  She knows that she is retaining some fluid and is trying to stay away from salt and read labels to help.   Expected Outcome Short: Continue to read labels and watch salt.  Long: Continue with heart healthy diet.       Psychosocial: Target Goals: Acknowledge presence or absence of significant depression and/or stress, maximize coping skills, provide positive support system. Participant is able to verbalize  types and ability to use techniques and skills needed for reducing stress and depression.   Initial Review & Psychosocial Screening:     Initial Psych Review & Screening - 09/19/16 1246      Initial Review   Comments Laurell reports that she got down to 200lbs but when all this happened she "got depressed and just ate up to 240lbs now. Rebbeca reports that she is suppose to get a genetic test since many anti-depressants are on the list that possibly her body doesnt' break down correctly. We discussed that even thought she needs knee surgery after her torn menisus that exercise usually helps with depression. Alesia Banda states that she doesn't want to kill herself but she "wants to buy a train ticket and leave far away but tthey will find me. " Bobetta reports that her Mother lives with her since her Mother is a Retired Marine scientist. She also lives with her boyfriend and his daughter and his daughter's 2 children. Plus there are other children there 4 days away who they babysit.       Quality of Life Scores:      Quality of Life - 09/19/16 1311      Quality of Life Scores   Health/Function Pre 3.8 %   Socioeconomic Pre 5.56 %   Psych/Spiritual Pre 5.36 %   Family Pre 2.4 %   GLOBAL Pre 4.31 %      PHQ-9: Recent Review Flowsheet Data    Depression screen [facility] 2/9 09/19/2016   Decreased Interest 3   Down, Depressed, Hopeless 2   PHQ - 2 Score 5   Altered sleeping 3   Tired, decreased energy 3   Change in appetite 3   Feeling bad or failure about yourself  2   Trouble concentrating 2   Moving slowly or fidgety/restless 2   Suicidal thoughts 0   PHQ-9 Score 20   Difficult doing work/chores Very difficult     Interpretation of Total Score  Total Score Depression Severity:  1-4 = Minimal depression, 5-9 = Mild depression, 10-14 = Moderate depression, 15-19 = Moderately severe depression, 20-27 = Severe depression   Psychosocial Evaluation and Intervention:     Psychosocial Evaluation - 10/03/16  1000      Psychosocial Evaluation & Interventions   Interventions Encouraged to exercise with the program and follow exercise prescription;Stress management education;Therapist referral;Relaxation education   Comments Counselor met with Ms. Elicia Lamp Sharrie Rothman) today for initial psychosocial evaluation.  She is a 51 year old who had an aortic valve replacement several months ago.  Yong also has multiple health issues with diabetes; Lupus; a miniscus tear in her right knee on top of mental health issues.  Mischele has a strong support system with a significant other of (2) years whom she lives with and her mother as well.  She has other relatives who live locally.  Jailey reports having sleep difficulties with approximately 3-4 hours of 'good sleep.  Her appetite is up and down - and varies with her mood.  Mireyah reports a history of depression and anxiety and is on 80 mg of Prozac since last Fall - but states it is "not working."  She has tried other medications for her mood but is very sensitive to medications and none have been extremely helpful.  She has multiple stressors with her health; conflict in the family with her mother and Significant other's daughter and (2) small children who live with Sharrie Rothman.  She has a PHQ-9 score of 20 which indicates severe depression.  She denies homicidal or suicidal ideations.   She reports she is not being followed with a psychiatrist currently and counselor gave her a recommendation to try to see if her insurance will cover.  Counselor recommended consistent exercise to see if this helps with mood and sleep somewhat.; and to see the psychiatrist.  Counselor will also recommend a therapist for client locally to help with mood; thoughts and improved coping strategies.  Counselor will be following with Ivalene throughout the course of this program.     Expected Outcomes Carlon will benefit from consistent exercise to achieve her stated goals of "getting my life back."  Also this will help  improve her mood and possible sleep issues.  Counselor recommended Harry sees a psychiatrist to address her medications not being helpful at this time.  Counselor will provide stress management and depression education for the class over the next few months to help Radley learn to develop positive coping strategies.  Counselor will follow.     Continue Psychosocial Services  Follow up required by counselor      Psychosocial Re-Evaluation:     Psychosocial Re-Evaluation    [place] Name 09/26/16 [phone] 10/26/16 0921           Psychosocial Re-Evaluation   Comments Jacquiline left a vm on the Cardiac Rehab phone vm that she would not be able to attend today since she is sick. Counselor follow up with Sharrie Rothman today.  She reports having fallen down her cement steps several weeks ago and had to be taken to the hospital.  She has some noticeable bruising and some ongoing back pain, but was able to come back to class today.  Eudelia continues to struggle with anxiety and is on the schedule to see a psychiatrist on 8/16.  Her PCP is weaning her off the Prozac since it "is not working."  She is more irritable lately but attributes that to not smoking more than decreased medication.  Azalie reports she is more relaxed in her body and mind when she comes to class or exercises and will walk on her off days to continue this.  She is coping by eating more; isolating and walking currently.  Counselor discussed positive self care with Zohar - trying deep breathing and mindfulness strategies or visualization when she is feeling anxious or stressed.  Counselor commended her for continuing to exercise - even on her "off days" from this class - and for not smoking during this stressful time.  Counselor will follow with her after she sees the psychiatrist and will help her find a counselor if recommended by Dr.        Noberto Retort Outcomes  - Leticia will continue to exercise consistently for her overall health.  She will see the psychiatrist for her  mental health mood needs.  She will practice positive self care in the meantime.      Interventions  - Stress management  education;Relaxation education      Continue Psychosocial Services   - Follow up required by counselor         Psychosocial Discharge (Final Psychosocial Re-Evaluation):     Psychosocial Re-Evaluation - 10/26/16 0921      Psychosocial Re-Evaluation   Comments Counselor follow up with Sharrie Rothman today.  She reports having fallen down her cement steps several weeks ago and had to be taken to the hospital.  She has some noticeable bruising and some ongoing back pain, but was able to come back to class today.  Shawntell continues to struggle with anxiety and is on the schedule to see a psychiatrist on 8/16.  Her PCP is weaning her off the Prozac since it "is not working."  She is more irritable lately but attributes that to not smoking more than decreased medication.  Laylee reports she is more relaxed in her body and mind when she comes to class or exercises and will walk on her off days to continue this.  She is coping by eating more; isolating and walking currently.  Counselor discussed positive self care with [place] - trying deep breathing and mindfulness strategies or visualization when she is feeling anxious or stressed.  Counselor commended her for continuing to exercise - even on her "off days" from this class - and for not smoking during this stressful time.  Counselor will follow with her after she sees the psychiatrist and will help her find a counselor if recommended by Dr.     Noberto Retort Outcomes Monifah will continue to exercise consistently for her overall health.  She will see the psychiatrist for her mental health mood needs.  She will practice positive self care in the meantime.   Interventions Stress management education;Relaxation education   Continue Psychosocial Services  Follow up required by counselor      Vocational Rehabilitation: Provide vocational rehab assistance to  qualifying candidates.   Vocational Rehab Evaluation & Intervention:     Vocational Rehab - 09/19/16 1241      Initial Vocational Rehab Evaluation & Intervention   Assessment shows need for Vocational Rehabilitation Yes      Education: Education Goals: Education classes will be provided on a weekly basis, covering required topics. Participant will state understanding/return demonstration of topics presented.  Learning Barriers/Preferences:     Learning Barriers/Preferences - 09/19/16 1240      Learning Barriers/Preferences   Learning Barriers None   Learning Preferences None      Education Topics: General Nutrition Guidelines/Fats and Fiber: -Group instruction provided by verbal, written material, models and posters to present the general guidelines for heart healthy nutrition. Gives an explanation and review of dietary fats and fiber.   Controlling Sodium/Reading Food Labels: -Group verbal and written material supporting the discussion of sodium use in heart healthy nutrition. Review and explanation with models, verbal and written materials for utilization of the food label.   Exercise Physiology & Risk Factors: - Group verbal and written instruction with models to review the exercise physiology of the cardiovascular system and associated critical values. Details cardiovascular disease risk factors and the goals associated with each risk factor.   Cardiac Rehab from 11/07/2016 in [facility] Cardiac and Pulmonary Rehab  Date  10/03/16  Educator  [facility]  Instruction Review Code  2- meets goals/outcomes      Aerobic Exercise & Resistance Training: - Gives group verbal and written discussion on the health impact of inactivity. On the components of aerobic and resistive training programs and the  benefits of this training and how to safely progress through these programs.   Flexibility, Balance, General Exercise Guidelines: - Provides group verbal and written instruction on the  benefits of flexibility and balance training programs. Provides general exercise guidelines with specific guidelines to those with heart or lung disease. Demonstration and skill practice provided.   Cardiac Rehab from 11/07/2016 in [facility] Cardiac and Pulmonary Rehab  Date  10/10/16  Educator  [facility]  Instruction Review Code  2- meets goals/outcomes      Stress Management: - Provides group verbal and written instruction about the health risks of elevated stress, cause of high stress, and healthy ways to reduce stress.   Depression: - Provides group verbal and written instruction on the correlation between heart/lung disease and depressed mood, treatment options, and the stigmas associated with seeking treatment.   Cardiac Rehab from 11/07/2016 in [facility] Cardiac and Pulmonary Rehab  Date  09/21/16  Educator  [facility]  Instruction Review Code  2- meets goals/outcomes      Anatomy & Physiology of the Heart: - Group verbal and written instruction and models provide basic cardiac anatomy and physiology, with the coronary electrical and arterial systems. Review of: AMI, Angina, Valve disease, Heart Failure, Cardiac Arrhythmia, Pacemakers, and the ICD.   Cardiac Procedures: - Group verbal and written instruction and models to describe the testing methods done to diagnose heart disease. Reviews the outcomes of the test results. Describes the treatment choices: Medical Management, Angioplasty, or Coronary Bypass Surgery.   Cardiac Rehab from 11/07/2016 in [facility] Cardiac and Pulmonary Rehab  Date  10/24/16  Educator  CE  Instruction Review Code  2- meets goals/outcomes      Cardiac Medications: - Group verbal and written instruction to review commonly prescribed medications for heart disease. Reviews the medication, class of the drug, and side effects. Includes the steps to properly store meds and maintain the prescription regimen.   Cardiac Rehab from 11/07/2016 in [facility] Cardiac and Pulmonary Rehab  Date   10/31/16 [10/31/16 Part 1 11/02/16 Part 2]  Educator  [facility]  Instruction Review Code  2- meets goals/outcomes      Go Sex-Intimacy & Heart Disease, Get SMART - Goal Setting: - Group verbal and written instruction through game format to discuss heart disease and the return to sexual intimacy. Provides group verbal and written material to discuss and apply goal setting through the application of the S.M.A.R.T. Method.   Cardiac Rehab from 11/07/2016 in [facility] Cardiac and Pulmonary Rehab  Date  10/24/16  Educator  CE  Instruction Review Code  2- meets goals/outcomes      Other Matters of the Heart: - Provides group verbal, written materials and models to describe Heart Failure, Angina, Valve Disease, and Diabetes in the realm of heart disease. Includes description of the disease process and treatment options available to the cardiac patient.   Exercise & Equipment Safety: - Individual verbal instruction and demonstration of equipment use and safety with use of the equipment.   Cardiac Rehab from 11/07/2016 in [facility] Cardiac and Pulmonary Rehab  Date  09/19/16  Educator  Loletha Grayer ENterkinRN  Instruction Review Code  1- partially meets, needs review/practice      Infection Prevention: - Provides verbal and written material to individual with discussion of infection control including proper hand washing and proper equipment cleaning during exercise session.   Cardiac Rehab from 11/07/2016 in [facility] Cardiac and Pulmonary Rehab  Date  09/19/16  Educator  C. Enterkin, R.N.  Instruction Review Code  2- meets goals/outcomes      Falls Prevention: - Provides verbal and written material to individual with discussion of falls prevention and safety.   Cardiac Rehab from 11/07/2016 in [facility] Cardiac and Pulmonary Rehab  Date  09/19/16  Educator  C. Pinellas  Instruction Review Code  2- meets goals/outcomes      Diabetes: - Individual verbal and written instruction to review signs/symptoms of diabetes,  desired ranges of glucose level fasting, after meals and with exercise. Advice that pre and post exercise glucose checks will be done for 3 sessions at entry of program.   Cardiac Rehab from 11/07/2016 in [facility] Cardiac and Pulmonary Rehab  Date  09/19/16  Educator  C. Enterkin, RN  Instruction Review Code  1- partially meets, needs review/practice       Knowledge Questionnaire Score:     Knowledge Questionnaire Score - 09/21/16 0915      Knowledge Questionnaire Score   Pre Score 19/28  Reviewed results with patient today.        Core Components/Risk Factors/Patient Goals at Admission:     Personal Goals and Risk Factors at Admission - 09/19/16 1214      Core Components/Risk Factors/Patient Goals on Admission    Weight Management Yes;Obesity   Intervention Weight Management: Develop a combined nutrition and exercise program designed to reach desired caloric intake, while maintaining appropriate intake of nutrient and fiber, sodium and fats, and appropriate energy expenditure required for the weight goal.;Weight Management: Provide education and appropriate resources to help participant work on and attain dietary goals.;Weight Management/Obesity: Establish reasonable short term and long term weight goals.;Obesity: Provide education and appropriate resources to help participant work on and attain dietary goals.   Admit Weight 241 lb 14.4 oz (109.7 kg)   Goal Weight: Short Term 235 lb (106.6 kg)   Goal Weight: Long Term 200 lb (90.7 kg)   Expected Outcomes Short Term: Continue to assess and modify interventions until short term weight is achieved;Weight Loss: Understanding of general recommendations for a balanced deficit meal plan, which promotes 1-2 lb weight loss per week and includes a negative energy balance of [phone] kcal/d;Understanding recommendations for meals to include 15-35% energy as protein, 25-35% energy from fat, 35-60% energy from carbohydrates, less than '200mg'$  of dietary  cholesterol, 20-35 gm of total fiber daily;Long Term: Adherence to nutrition and physical activity/exercise program aimed toward attainment of established weight goal;Understanding of distribution of calorie intake throughout the day with the consumption of 4-5 meals/snacks   Tobacco Cessation Yes   Intervention Assist the participant in steps to quit. Provide individualized education and counseling about committing to Tobacco Cessation, relapse prevention, and pharmacological support that can be provided by physician.   Expected Outcomes Long Term: Complete abstinence from all tobacco products for at least 12 months from quit date.   Improve shortness of breath with ADL's Yes   Intervention Provide education, individualized exercise plan and daily activity instruction to help decrease symptoms of SOB with activities of daily living.   Expected Outcomes Short Term: Achieves a reduction of symptoms when performing activities of daily living.   Diabetes Yes   Intervention Provide education about signs/symptoms and action to take for hypo/hyperglycemia.;Provide education about proper nutrition, including hydration, and aerobic/resistive exercise prescription along with prescribed medications to achieve blood glucose in normal ranges: Fasting glucose 65-99 mg/dL   Expected Outcomes Short Term: Participant verbalizes understanding of the signs/symptoms and immediate care of hyper/hypoglycemia, proper foot care and importance of medication, aerobic/resistive exercise  and nutrition plan for blood glucose control.;Long Term: Attainment of HbA1C < 7%.   Hypertension Yes   Intervention Provide education on lifestyle modifcations including regular physical activity/exercise, weight management, moderate sodium restriction and increased consumption of fresh fruit, vegetables, and low fat dairy, alcohol moderation, and smoking cessation.;Monitor prescription use compliance.   Expected Outcomes Short Term: Continued  assessment and intervention until BP is < 140/63m HG in hypertensive participants. < 130/899mHG in hypertensive participants with diabetes, heart failure or chronic kidney disease.;Long Term: Maintenance of blood pressure at goal levels.   Lipids Yes   Intervention Provide education and support for participant on nutrition & aerobic/resistive exercise along with prescribed medications to achieve LDL '70mg'$ , HDL >'40mg'$ .   Expected Outcomes Short Term: Participant states understanding of desired cholesterol values and is compliant with medications prescribed. Participant is following exercise prescription and nutrition guidelines.;Long Term: Cholesterol controlled with medications as prescribed, with individualized exercise RX and with personalized nutrition plan. Value goals: LDL < '70mg'$ , HDL > 40 mg.   Stress Yes   Intervention Offer individual and/or small group education and counseling on adjustment to heart disease, stress management and health-related lifestyle change. Teach and support self-help strategies.;Refer participants experiencing significant psychosocial distress to appropriate mental health specialists for further evaluation and treatment. When possible, include family members and significant others in education/counseling sessions.   Expected Outcomes Short Term: Participant demonstrates changes in health-related behavior, relaxation and other stress management skills, ability to obtain effective social support, and compliance with psychotropic medications if prescribed.;Long Term: Emotional wellbeing is indicated by absence of clinically significant psychosocial distress or social isolation.      Core Components/Risk Factors/Patient Goals Review:      Goals and Risk Factor Review    Row Name 10/26/16 0912             Core Components/Risk Factors/Patient Goals Review   Personal Goals Review Weight Management/Obesity;Improve shortness of breath with ADL's;Tobacco  Cessation;Hypertension;Lipids;Diabetes       Review KaReiseas been not been able to lose much weight as she has not been able to exercise since her fall.   She still has refrained from smoking and makes her family members go outside to smoke!  She has been doing well with her blood pressures and blood sugars.  She routinely checks them at home.  She has been doing well with most of her medications (see note in pyschosocial for prozac not working).  She is interested in sticking with the program to continue to work on risk factor modifications.       Expected Outcomes Short: Continue to stay away from smoking. Long: Continue to work on risk factor modification.          Core Components/Risk Factors/Patient Goals at Discharge (Final Review):      Goals and Risk Factor Review - 10/26/16 0912      Core Components/Risk Factors/Patient Goals Review   Personal Goals Review Weight Management/Obesity;Improve shortness of breath with ADL's;Tobacco Cessation;Hypertension;Lipids;Diabetes   Review KaMisaas been not been able to lose much weight as she has not been able to exercise since her fall.   She still has refrained from smoking and makes her family members go outside to smoke!  She has been doing well with her blood pressures and blood sugars.  She routinely checks them at home.  She has been doing well with most of her medications (see note in pyschosocial for prozac not working).  She is interested in sticking with  the program to continue to work on risk factor modifications.   Expected Outcomes Short: Continue to stay away from smoking. Long: Continue to work on risk factor modification.      ITP Comments:     ITP Comments    Row Name 09/19/16 1244 09/19/16 1311 09/20/16 1323 09/26/16 0926 10/07/16 0853   ITP Comments Madina reports she has Lupus, Rheumatoid Arthritis, Fibryomyalgia. 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 Hindy reports that she got down to 200lbs but when all this happened she "got depressed and just ate up to 240lbs now. " Aylanie reports that she has had diabetes for 5 years.  ITP was created during Medical Review after Cardiac Rehab informed consent was signed. DX EPIC/CHL discharge summary I left Manvi a vm that she can get a free physical therapy screening for her knee problem. She can call [phone] or check in at the front desk.  Tyshia left a vm on the Cardiac Rehab phone vm that she would not be able to attend today since she is sick. Zelie is wearing a 30 day holter monitor.  SHe has had a few medication changes   Row Name 10/12/16 0631 10/12/16 1600 11/09/16 0608       ITP Comments 30 day review. Continue with ITP unless directed changes per Medical Director review   New to program Khamya called to let us know that she has been out because she fell down concrete steps at home.  She was evaluated by the ER.  She also has a follow up appointment on Friday and will be out of class.  30 day review. Continue with ITP unless directed changes per Medical Director review         Comments:

## 2016-11-18 ENCOUNTER — Telehealth: Payer: Self-pay | Admitting: *Deleted

## 2016-11-18 ENCOUNTER — Encounter: Payer: Self-pay | Admitting: *Deleted

## 2016-11-18 DIAGNOSIS — Z952 Presence of prosthetic heart valve: Secondary | ICD-10-CM

## 2016-11-18 NOTE — Telephone Encounter (Signed)
Called to check on status of return.  She fell again at home and knee swelled up again.  She is covered in bruises.  She hopes to return next week.  She wants to stay off the treadmill for a week or so.

## 2016-11-30 ENCOUNTER — Telehealth: Payer: Self-pay | Admitting: *Deleted

## 2016-11-30 ENCOUNTER — Encounter: Payer: Managed Care, Other (non HMO) | Attending: Internal Medicine

## 2016-11-30 ENCOUNTER — Encounter: Payer: Self-pay | Admitting: *Deleted

## 2016-11-30 DIAGNOSIS — I10 Essential (primary) hypertension: Secondary | ICD-10-CM | POA: Insufficient documentation

## 2016-11-30 DIAGNOSIS — Z954 Presence of other heart-valve replacement: Secondary | ICD-10-CM | POA: Insufficient documentation

## 2016-11-30 DIAGNOSIS — Z87891 Personal history of nicotine dependence: Secondary | ICD-10-CM | POA: Insufficient documentation

## 2016-11-30 DIAGNOSIS — I2510 Atherosclerotic heart disease of native coronary artery without angina pectoris: Secondary | ICD-10-CM | POA: Insufficient documentation

## 2016-11-30 DIAGNOSIS — M329 Systemic lupus erythematosus, unspecified: Secondary | ICD-10-CM | POA: Insufficient documentation

## 2016-11-30 DIAGNOSIS — Z952 Presence of prosthetic heart valve: Secondary | ICD-10-CM

## 2016-11-30 DIAGNOSIS — E119 Type 2 diabetes mellitus without complications: Secondary | ICD-10-CM | POA: Insufficient documentation

## 2016-11-30 NOTE — Telephone Encounter (Signed)
Called to check on status of return.  Left message.   Out since 11/07/16

## 2016-12-04 ENCOUNTER — Encounter: Payer: Self-pay | Admitting: Emergency Medicine

## 2016-12-04 ENCOUNTER — Emergency Department
Admission: EM | Admit: 2016-12-04 | Discharge: 2016-12-04 | Disposition: A | Discharge status: Home / Self Care | Payer: Managed Care, Other (non HMO) | Attending: Emergency Medicine | Admitting: Emergency Medicine

## 2016-12-04 DIAGNOSIS — J449 Chronic obstructive pulmonary disease, unspecified: Secondary | ICD-10-CM | POA: Diagnosis not present

## 2016-12-04 DIAGNOSIS — I2510 Atherosclerotic heart disease of native coronary artery without angina pectoris: Secondary | ICD-10-CM | POA: Insufficient documentation

## 2016-12-04 DIAGNOSIS — E119 Type 2 diabetes mellitus without complications: Secondary | ICD-10-CM | POA: Diagnosis not present

## 2016-12-04 DIAGNOSIS — Z87891 Personal history of nicotine dependence: Secondary | ICD-10-CM | POA: Insufficient documentation

## 2016-12-04 DIAGNOSIS — Z952 Presence of prosthetic heart valve: Secondary | ICD-10-CM | POA: Insufficient documentation

## 2016-12-04 DIAGNOSIS — R51 Headache: Secondary | ICD-10-CM | POA: Diagnosis present

## 2016-12-04 DIAGNOSIS — Z7984 Long term (current) use of oral hypoglycemic drugs: Secondary | ICD-10-CM | POA: Diagnosis not present

## 2016-12-04 DIAGNOSIS — I10 Essential (primary) hypertension: Secondary | ICD-10-CM | POA: Diagnosis not present

## 2016-12-04 DIAGNOSIS — Z79899 Other long term (current) drug therapy: Secondary | ICD-10-CM | POA: Insufficient documentation

## 2016-12-04 DIAGNOSIS — Z7901 Long term (current) use of anticoagulants: Secondary | ICD-10-CM | POA: Insufficient documentation

## 2016-12-04 DIAGNOSIS — R519 Headache, unspecified: Secondary | ICD-10-CM

## 2016-12-04 MED ORDER — DIMENHYDRINATE 50 MG PO TABS
25.0000 mg | ORAL_TABLET | Freq: Once | ORAL | Status: DC
  Filled 2016-12-04: qty 1

## 2016-12-04 MED ORDER — MECLIZINE HCL 25 MG PO TABS
25.0000 mg | ORAL_TABLET | Freq: Once | ORAL | Status: AC
Start: 2016-12-04 — End: 2016-12-04
  Administered 2016-12-04: 25 mg via ORAL
  Filled 2016-12-04 (×2): qty 1

## 2016-12-04 NOTE — ED Triage Notes (Signed)
Pt comes into the ED via POV c/o headaches on the frontal side and temporal.  Patient states they have been off and on since she fell a couple of weeks ago and hit her head.  Patient came here after the fall and was scanned with no bleeding found.  Patient states the pain is still there.  Patient is coumadin patient.  Alert and oriented and ambulatory to triage at this time.  Patient in NAD at this time with even and unlabored respirations.  Patient also states she has had an increase in mood swings since this happened.  Denies any chest pain, Shortness of breath.  States "I feel like im on a boat with the movement in my head".

## 2016-12-04 NOTE — ED Provider Notes (Signed)
[facility] Emergency Department Provider Note    ____________________________________________   I have reviewed the triage vital signs and the nursing notes.   HISTORY  Chief Complaint Headache   History limited by: Not Limited   HPI [name] is a 51 y.o. female who presents to the emergency department today because of concerns for headache. The patient had a fall almost 8 weeks ago. Since that time she's been having problems with headaches. She did have a CT scan which was negative at the time of the fall. Headaches are located behind her eyes. They have been gradually getting worse. She is also started developing some "swimming" sensations. She did discuss this with her doctor at appointment last week. She states that the doctor thought it might be medication related.Patient has not tried any medications for motion sickness.   Past Medical History:  Diagnosis Date  . CAD (coronary artery disease)    per cardiac MRI  . Diabetes mellitus without complication (HCC)   . Hypertension   . Lupus (systemic lupus erythematosus) (HCC)     Patient Active Problem List   Diagnosis Date Noted  . 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 Atrial fibrillation (HCC) 07/02/2016  . Anxiety, generalized 06/27/2016  . Chest pain 04/21/2016  . 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 ASHD (arteriosclerotic heart disease) 11/02/2011  . Chronic back pain 11/02/2011    Past Surgical History:  Procedure Laterality Date  . APPENDECTOMY    . CESAREAN SECTION    . KNEE ARTHROSCOPY    . TONSILLECTOMY    . VALVE REPLACEMENT      Prior to Admission medications   Medication Sig Start Date End Date Taking? Authorizing Provider  acetaminophen (TYLENOL) 500 MG tablet Take 500 mg by mouth every 6 (six) hours as needed.    [provider]  acetaminophen-codeine (TYLENOL #3) 300-30 MG tablet TAKE 1-2 TABLET  EVERY 8HOURS AS NEEDED FOR PAIN, MAX 4 TABS/DAY 07/21/16   [provider]  albuterol (PROVENTIL HFA;VENTOLIN HFA) 108 (90 Base) MCG/ACT inhaler Inhale 1-2 puffs into the lungs every 6 (six) hours as needed for wheezing or shortness of breath.    [provider]  amLODipine (NORVASC) 5 MG tablet Take 5 mg by mouth daily.    [provider]  aspirin 81 MG tablet Take 81 mg by mouth daily.    [provider]  baclofen (LIORESAL) 10 MG tablet Take 10 mg by mouth 2 (two) times daily.    [provider]  budesonide-formoterol (SYMBICORT) 160-4.5 MCG/ACT inhaler Inhale 2 puffs into the lungs 2 (two) times daily. 01/14/16 01/13/17  [provider]  budesonide-formoterol (SYMBICORT) 160-4.5 MCG/ACT inhaler Inhale into the lungs. 01/14/16 01/13/17  [provider]  dicyclomine (BENTYL) 20 MG tablet Take 1 tablet (20 mg total) by mouth 3 (three) times daily as needed for spasms. Patient not taking: Reported on 04/21/2016 05/14/15 05/13/16  Myrna Blazer, MD  FLUoxetine (PROZAC) 40 MG capsule Take 40 mg by mouth 2 (two) times daily.     [provider]  furosemide (LASIX) 40 MG tablet Take by mouth. 07/18/16 07/18/17  [provider]  ibuprofen (ADVIL,MOTRIN) 600 MG tablet Take 1 tablet (600 mg total) by mouth 3 (three) times daily. Patient not taking: Reported on 09/19/2016 04/21/16   Adrian Saran, MD  lidocaine (LIDODERM) 5 % Place 1 patch onto the skin every 12 (twelve) hours. Remove &  Discard patch within 12 hours or as directed by MD 10/17/16 10/17/17  Menshew, Charlesetta Ivory, PA-C  metFORMIN (GLUCOPHAGE) 500 MG tablet Take by mouth 2 (two) times daily with a meal.    [provider]  methocarbamol (ROBAXIN) 500 MG tablet Take 1 tablet (500 mg total) by mouth 2 (two) times daily. Patient not taking: Reported on 10/11/2016 01/06/15   Payton Mccallum, MD  metoprolol tartrate (LOPRESSOR) 25 MG tablet Take 25 mg by mouth 2  (two) times daily.    [provider]  omeprazole (PRILOSEC) 40 MG capsule Take 40 mg by mouth daily.    [provider]  potassium chloride SA (K-DUR,KLOR-CON) 20 MEQ tablet Take 20 mEq by mouth once.    [provider]  pravastatin (PRAVACHOL) 20 MG tablet Take 20 mg by mouth daily.    [provider]  ranitidine (ZANTAC) 300 MG tablet Take 300 mg by mouth at bedtime.    [provider]  sennosides-docusate sodium (SENOKOT-S) 8.6-50 MG tablet Take 1 tablet by mouth at bedtime.    [provider]  Vitamin D, Ergocalciferol, (DRISDOL) 50000 units CAPS capsule Take 50,000 Units by mouth every 7 (seven) days.    [provider]  warfarin (COUMADIN) 5 MG tablet Take 5 mg by mouth daily.    [provider]    Allergies Flexeril [cyclobenzaprine]; Celecoxib; Gabapentin; Iodinated diagnostic agents; Morphine and related; Percocet [oxycodone-acetaminophen]; Skelaxin [metaxalone]; Demerol [meperidine]; Nortriptyline; Tramadol; Ketorolac; and Norflex [orphenadrine citrate]  Family History  Problem Relation Age of Onset  . Hyperlipidemia Mother   . Hypertension Father   . Diabetes Father   . Hyperlipidemia Father     Social History Social History  Substance Use Topics  . Smoking status: Former Smoker    Packs/day: 0.50    Years: 15.00    Types: Cigarettes    Quit date: 06/26/2016  . Smokeless tobacco: Never Used  . Alcohol use No    Review of Systems Constitutional: No fever/chills Eyes: No visual changes. ENT: No sore throat. Cardiovascular: Denies chest pain. Respiratory: Denies shortness of breath. Gastrointestinal: No abdominal pain.  No nausea, no vomiting.  No diarrhea.   Genitourinary: Negative for dysuria. Musculoskeletal: Negative for back pain. Skin: Negative for rash. Neurological: positive for headache ____________________________________________   PHYSICAL EXAM:  VITAL SIGNS: ED Triage Vitals  [12/04/16 1323]  Enc Vitals Group     BP 138/71     Pulse Rate 72     Resp 16     Temp 98.2 F (36.8 C)     Temp Source Oral     SpO2 99 %     Weight 250 lb (113.4 kg)     Height  (1.499 m)     Head Circumference      Peak Flow      Pain Score 8   Constitutional: Alert and oriented. Well appearing and in no distress. Eyes: Conjunctivae are normal.  ENT   Head: Normocephalic and atraumatic.   Nose: No congestion/rhinnorhea.   Mouth/Throat: Mucous membranes are moist.   Neck: No stridor. Hematological/Lymphatic/Immunilogical: No cervical lymphadenopathy. Cardiovascular: Normal rate, regular rhythm.  No murmurs, rubs, or gallops.  Respiratory: Normal respiratory effort without tachypnea nor retractions. Breath sounds are clear and equal bilaterally. No wheezes/rales/rhonchi. Gastrointestinal: Soft and non tender. No rebound. No guarding.  Genitourinary: Deferred Musculoskeletal: Normal range of motion in all extremities. No lower extremity edema. Neurologic:  Normal speech and language. No gross focal neurologic deficits  are appreciated.  Skin:  Skin is warm, dry and intact. No rash noted. Psychiatric: Mood and affect are normal. Speech and behavior are normal. Patient exhibits appropriate insight and judgment.  ____________________________________________    LABS (pertinent positives/negatives)  Labs Reviewed - No data to display   ____________________________________________   EKG  None  ____________________________________________    RADIOLOGY  None  ____________________________________________   PROCEDURES  Procedures  ____________________________________________   INITIAL IMPRESSION / ASSESSMENT AND PLAN / ED COURSE  Pertinent labs & imaging results that were available during my care of the patient were reviewed by me and considered in my medical decision making (see chart for details).  Patient presents to the emergency  department today because of concerns for continued headache from a fall that occurred roughly 8 weeks ago. No concerning neurological deficits. At this point do not feel any emergent neuro imaging is necessary. Discussed with patient's importance of continued follow-up with primary care physician.  ____________________________________________   FINAL CLINICAL IMPRESSION(S) / ED DIAGNOSES  Final diagnoses:  Bad headache     Note: This dictation was prepared with Dragon dictation. Any transcriptional errors that result from this process are unintentional     Phineas SemenGoodman, Kenzee Bassin, MD 12/04/16 1451

## 2016-12-04 NOTE — Discharge Instructions (Signed)
Please seek medical attention for any high fevers, chest pain, shortness of breath, change in behavior, persistent vomiting, bloody stool or any other new or concerning symptoms.  

## 2016-12-07 ENCOUNTER — Encounter: Payer: Self-pay | Admitting: *Deleted

## 2016-12-07 DIAGNOSIS — Z952 Presence of prosthetic heart valve: Secondary | ICD-10-CM

## 2016-12-07 NOTE — Progress Notes (Signed)
Cardiac Individual Treatment Plan  Patient Details  Name: [name] MRN: [id number] Date of Birth: 49/05/1965 Referring Provider:     Cardiac Rehab from 09/19/2016 in [facility] Cardiac [facility] Re[facility]  Referring Provider  Ward, Jeani Hawking MD      Initial Encounter Date:    Cardiac Rehab from 09/19/2016 in [facility] Cardiac and Pulmonary Rehab  Date  09/19/16  Referring Provider  Ward, Jeani Hawking MD      Visit Diagnosis: Aortic valve replaced  Patient's Home Medications on Admission:  Current Outpatient Prescriptions:  .  acetaminophen (TYLENOL) 500 MG tablet, Take 500 mg by mouth every 6 (six) hours as needed., Disp: , Rfl:  .  acetaminophen-codeine (TYLENOL #3) 300-30 MG tablet, TAKE 1-2 TABLET EVERY 8HOURS AS NEEDED FOR PAIN, MAX 4 TABS/DAY, Disp: , Rfl:  .  albuterol (PROVENTIL HFA;VENTOLIN HFA) 108 (90 Base) MCG/ACT inhaler, Inhale 1-2 puffs into the lungs every 6 (six) hours as needed for wheezing or shortness of breath., Disp: , Rfl:  .  amLODipine (NORVASC) 5 MG tablet, Take 5 mg by mouth daily., Disp: , Rfl:  .  aspirin 81 MG tablet, Take 81 mg by mouth daily., Disp: , Rfl:  .  baclofen (LIORESAL) 10 MG tablet, Take 10 mg by mouth 2 (two) times daily., Disp: , Rfl:  .  budesonide-formoterol (SYMBICORT) 160-4.5 MCG/ACT inhaler, Inhale 2 puffs into the lungs 2 (two) times daily., Disp: , Rfl:  .  budesonide-formoterol (SYMBICORT) 160-4.5 MCG/ACT inhaler, Inhale into the lungs., Disp: , Rfl:  .  dicyclomine (BENTYL) 20 MG tablet, Take 1 tablet (20 mg total) by mouth 3 (three) times daily as needed for spasms. (Patient not taking: Reported on 04/21/2016), Disp: 15 tablet, Rfl: 0 .  FLUoxetine (PROZAC) 40 MG capsule, Take 40 mg by mouth 2 (two) times daily. , Disp: , Rfl:  .  furosemide (LASIX) 40 MG tablet, Take by mouth., Disp: , Rfl:  .  ibuprofen (ADVIL,MOTRIN) 600 MG tablet, Take 1 tablet (600 mg total) by mouth 3 (three) times daily. (Patient not taking: Reported on 09/19/2016), Disp: 30  tablet, Rfl: 0 .  lidocaine (LIDODERM) 5 %, Place 1 patch onto the skin every 12 (twelve) hours. Remove & Discard patch within 12 hours or as directed by MD, Disp: 10 patch, Rfl: 0 .  metFORMIN (GLUCOPHAGE) 500 MG tablet, Take by mouth 2 (two) times daily with a meal., Disp: , Rfl:  .  methocarbamol (ROBAXIN) 500 MG tablet, Take 1 tablet (500 mg total) by mouth 2 (two) times daily. (Patient not taking: Reported on 10/11/2016), Disp: 20 tablet, Rfl: 0 .  metoprolol tartrate (LOPRESSOR) 25 MG tablet, Take 25 mg by mouth 2 (two) times daily., Disp: , Rfl:  .  omeprazole (PRILOSEC) 40 MG capsule, Take 40 mg by mouth daily., Disp: , Rfl:  .  potassium chloride SA (K-DUR,KLOR-CON) 20 MEQ tablet, Take 20 mEq by mouth once., Disp: , Rfl:  .  pravastatin (PRAVACHOL) 20 MG tablet, Take 20 mg by mouth daily., Disp: , Rfl:  .  ranitidine (ZANTAC) 300 MG tablet, Take 300 mg by mouth at bedtime., Disp: , Rfl:  .  sennosides-docusate sodium (SENOKOT-S) 8.6-50 MG tablet, Take 1 tablet by mouth at bedtime., Disp: , Rfl:  .  Vitamin D, Ergocalciferol, (DRISDOL) 50000 units CAPS capsule, Take 50,000 Units by mouth every 7 (seven) days., Disp: , Rfl:  .  warfarin (COUMADIN) 5 MG tablet, Take 5 mg by mouth daily., Disp: , Rfl:   Past Medical History:  Past Medical History:  Diagnosis Date  . CAD (coronary artery disease)    per cardiac MRI  . Diabetes mellitus without complication ([place])   . Hypertension   . Lupus (systemic lupus erythematosus) ([facility])     Tobacco Use: History  Smoking Status  . Former Smoker  . Packs/day: 0.50  . Years: 15.00  . Types: Cigarettes  . Quit date: 06/26/2016  Smokeless Tobacco  . Never Used    Labs: Recent Review Flowsheet Data    Labs for ITP Cardiac and Pulmonary Rehab Latest Ref Rng & Units 04/21/2016   Hemoglobin A1c 4.8 - 5.6 % 5.4       Exercise Target Goals:    Exercise Program Goal: Individual exercise prescription set with THRR, safety & activity barriers.  Participant demonstrates ability to understand and report RPE using BORG scale, to self-measure pulse accurately, and to acknowledge the importance of the exercise prescription.  Exercise Prescription Goal: Starting with aerobic activity 30 plus minutes a day, 3 days per week for initial exercise prescription. Provide home exercise prescription and guidelines that participant acknowledges understanding prior to discharge.  Activity Barriers & Risk Stratification:     Activity Barriers & Cardiac Risk Stratification - 09/19/16 1217      Activity Barriers & Cardiac Risk Stratification   Activity Barriers Arthritis;Fibromyalgia;Shortness of Breath;Back Problems;Joint Problems;Deconditioning;Muscular Weakness;Balance Concerns;History of Falls  spinal stenosis, r knee meniscus tear, lupus, OA, RA   Cardiac Risk Stratification High      6 Minute Walk:     6 Minute Walk    Row Name 09/19/16 1425         6 Minute Walk   Phase Initial     Distance 1075 feet     Walk Time 6 minutes     # of Rest Breaks 0     MPH 2.04     METS 2.8     RPE 12     Perceived Dyspnea  1     VO2 Peak 9.82     Symptoms Yes (comment)     Comments knee pain 8/10 slightly SOB     Resting HR 73 bpm     Resting BP 140/70     Max Ex. HR 111 bpm     Max Ex. BP 164/74     2 Minute Post BP 126/72        Oxygen Initial Assessment:   Oxygen Re-Evaluation:   Oxygen Discharge (Final Oxygen Re-Evaluation):   Initial Exercise Prescription:     Initial Exercise Prescription - 09/19/16 1400      Date of Initial Exercise RX and Referring Provider   Date 09/19/16   Referring Provider Ward, Jeani Hawking MD     Treadmill   MPH 1.7   Grade 0.5   Minutes 15   METs 2.42     NuStep   Level 2   SPM 80   Minutes 15   METs 2.5     Biostep-RELP   Level 2   SPM 50   Minutes 15   METs 2     Prescription Details   Frequency (times per week) 3   Duration Progress to 45 minutes of aerobic exercise without  signs/symptoms of physical distress     Intensity   THRR 40-80% of Max Heartrate 112-151   Ratings of Perceived Exertion 11-15   Perceived Dyspnea 0-4     Progression   Progression Continue to progress workloads to maintain intensity without signs/symptoms of physical distress.  Resistance Training   Training Prescription Yes   Weight 3 lbs   Reps 10-15      Perform Capillary Blood Glucose checks as needed.  Exercise Prescription Changes:     Exercise Prescription Changes    Row Name 09/19/16 1400 10/04/16 1500 10/20/16 1100 11/04/16 0900 11/15/16 1500     Response to Exercise   Blood Pressure (Admit) 140/70 122/82 132/77 126/64 130/74   Blood Pressure (Exercise) 144/74 138/82 124/70 124/64 164/82   Blood Pressure (Exit) 126/72 142/80 122/70 124/70 140/80   Heart Rate (Admit) 73 bpm 80 bpm 76 bpm 76 bpm 67 bpm   Heart Rate (Exercise) 111 bpm 105 bpm 110 bpm 101 bpm 107 bpm   Heart Rate (Exit) 78 bpm 79 bpm 77 bpm 72 bpm 75 bpm   Oxygen Saturation (Admit) 100 %  -  -  -  -   Oxygen Saturation (Exercise) 100 %  -  -  -  -   Rating of Perceived Exertion (Exercise) 12  - _0 Perceived Dyspnea (Exercise) 1  -  -  -  -   Symptoms knee pain 8/10, slightly SOB none none none knee pain   Comments walk test results  -  -  -  -   Duration  - Progress to 45 minutes of aerobic exercise without signs/symptoms of physical distress Progress to 45 minutes of aerobic exercise without signs/symptoms of physical distress Continue with 45 min of aerobic exercise without signs/symptoms of physical distress. Continue with 45 min of aerobic exercise without signs/symptoms of physical distress.   Intensity  - THRR unchanged THRR unchanged THRR unchanged THRR unchanged     Progression   Progression  - Continue to progress workloads to maintain intensity without signs/symptoms of physical distress. Continue to progress workloads to maintain intensity without signs/symptoms of physical  distress. Continue to progress workloads to maintain intensity without signs/symptoms of physical distress. Continue to progress workloads to maintain intensity without signs/symptoms of physical distress.   Average METs  - 2.27 2.94 2.69 3.14     Resistance Training   Training Prescription  - Yes Yes Yes Yes   Weight  - 3 lb 3 lb 4 lbs 4 lbs   Reps  - 10-15 10-15 10-15 10-15     Interval Training   Interval Training  - No No No No     Treadmill   MPH  - 1.7 1.7 1.8 2.9   Grade  - 0.5 0.5 2 2.5   Minutes  - _1 METs  - 2.54 2.54 2.87 4.22     NuStep   Level  -  - _2 Minutes  -  - _3 METs  -  - 3.4 3.2 3.2     Biostep-RELP   Level  - _4 SPM  - 50  -  -  -   Minutes  - _5 METs  - _6 Home Exercise Plan   Plans to continue exercise at  -  - Home (comment)  walking and going to water aerobics Home (comment)  walking and going to water aerobics Home (comment)  walking and going to water aerobics   Frequency  -  - Add 2 additional days to program exercise sessions. Add 2 additional days to program exercise sessions. Add 2 additional  days to program exercise sessions.   Initial Home Exercises Provided  -  - 10/07/16 10/07/16 10/07/16      Exercise Comments:     Exercise Comments    Row Name 09/21/16 0901           Exercise Comments  First full day of exercise!  Patient was oriented to gym and equipment including functions, settings, policies, and procedures.  Patient's individual exercise prescription and treatment plan were reviewed.  All starting workloads were established based on the results of the 6 minute walk test done at initial orientation visit.  The plan for exercise progression was also introduced and progression will be customized based on patient's performance and goals.          Exercise Goals and Review:     Exercise Goals    Row Name 09/19/16 1434             Exercise Goals   Increase Physical  Activity Yes       Intervention Provide advice, education, support and counseling about physical activity/exercise needs.;Develop an individualized exercise prescription for aerobic and resistive training based on initial evaluation findings, risk stratification, comorbidities and participant's personal goals.       Expected Outcomes Achievement of increased cardiorespiratory fitness and enhanced flexibility, muscular endurance and strength shown through measurements of functional capacity and personal statement of participant.       Increase Strength and Stamina Yes       Intervention Provide advice, education, support and counseling about physical activity/exercise needs.;Develop an individualized exercise prescription for aerobic and resistive training based on initial evaluation findings, risk stratification, comorbidities and participant's personal goals.       Expected Outcomes Achievement of increased cardiorespiratory fitness and enhanced flexibility, muscular endurance and strength shown through measurements of functional capacity and personal statement of participant.          Exercise Goals Re-Evaluation :     Exercise Goals Re-Evaluation    Row Name 10/04/16 1544 10/20/16 1137 10/26/16 0903 11/04/16 0913 11/15/16 1513     Exercise Goal Re-Evaluation   Exercise Goals Review Increase Physical Activity;Increase Strenth and Stamina Increase Physical Activity;Increase Strenth and Stamina Increase Physical Activity;Increase Strenth and Stamina Increase Physical Activity;Increase Strenth and Stamina Increase Physical Activity;Increase Strenth and Stamina   Comments Evynn has tolerated exercise well on her first sessions. Jaquasha has been out with various things including a fall last week.  Her last session was 7/16.  We will try to pick her back up and make progress once she is cleared to return. Since her fall, Jamiyah has not been able to walk as much.  Her knee often feels that it is going to give  away.  She did go out to buy a cane to help support her knee.  She has been using 3lbs weights at home to maintain strength.  However, Diva has been able to tell how much she missed while she was out for her fall.  She is glad to be back in rehab.  She does have some trouble with her neck and we talked about looking into massage therapy now and physical therapy after she graduates. Mishayla has been doing well in rehab.  She is getting back into the swing of things since her fall. She is still having some difficulty with knee pain still.  We will continue to monitor her progression. Kihanna continues to do well in rehab despite her knee pain.  She did ask for an  ice pack last time she was here while she was exercing.  She has returned to the treadmill and started to increase her speed again.  We will continue to monitor her progression.    Expected Outcomes Short - attend class on a regular basis Long - Improve functional fitness level Short: Return to class regularly.  Long: Continue to increase strength and stamina.  Short:  Look into massage therapy for her neck.  Long: Continue to work on increasing phsycial activity at home. Short: Continue to build back up workloads.  Long: Increase activity at home.  Short: Continue to work on building back up.  Long: Increase acitivity at home.    Row Name 11/30/16 1530             Exercise Goal Re-Evaluation   Comments Out since last review          Discharge Exercise Prescription (Final Exercise Prescription Changes):     Exercise Prescription Changes - 11/15/16 1500      Response to Exercise   Blood Pressure (Admit) 130/74   Blood Pressure (Exercise) 164/82   Blood Pressure (Exit) 140/80   Heart Rate (Admit) 67 bpm   Heart Rate (Exercise) 107 bpm   Heart Rate (Exit) 75 bpm   Rating of Perceived Exertion (Exercise) 12   Symptoms knee pain   Duration Continue with 45 min of aerobic exercise without signs/symptoms of physical distress.   Intensity THRR  unchanged     Progression   Progression Continue to progress workloads to maintain intensity without signs/symptoms of physical distress.   Average METs 3.14     Resistance Training   Training Prescription Yes   Weight 4 lbs   Reps 10-15     Interval Training   Interval Training No     Treadmill   MPH 2.9   Grade 2.5   Minutes 15   METs 4.22     NuStep   Level 5   Minutes 15   METs 3.2     Biostep-RELP   Level 6   Minutes 15   METs 2     Home Exercise Plan   Plans to continue exercise at Home (comment)  walking and going to water aerobics   Frequency Add 2 additional days to program exercise sessions.   Initial Home Exercises Provided 10/07/16      Nutrition:  Target Goals: Understanding of nutrition guidelines, daily intake of sodium <1519m, cholesterol <2029m calories 30% from fat and 7% or less from saturated fats, daily to have 5 or more servings of fruits and vegetables.  Biometrics:     Pre Biometrics - 09/19/16 1434      Pre Biometrics   Height 5' (1.524 m)   Weight 241 lb 14.4 oz (109.7 kg)   Waist Circumference 45.5 inches   Hip Circumference 56 inches   Waist to Hip Ratio 0.81 %   BMI (Calculated) 47.3   Single Leg Stand 0.98 seconds       Nutrition Therapy Plan and Nutrition Goals:     Nutrition Therapy & Goals - 10/26/16 0923      Nutrition Therapy   RD appointment defered Yes      Nutrition Discharge: Rate Your Plate Scores:     Nutrition Assessments - 09/19/16 1218      MEDFICTS Scores   Pre Score 86      Nutrition Goals Re-Evaluation:     Nutrition Goals Re-Evaluation    Row Name 10/26/16 [phone]  Goals   Comment Avis continues to decline a nutrition appointment.  She states that she knows what to eat and how to stick with a diet.  She admits to cheating her diet twice a week with tacos from her boyfriend's food truck.  She knows that she is retaining some fluid and is trying to stay away from salt and  read labels to help.       Expected Outcome Short: Continue to read labels and watch salt.  Long: Continue with heart healthy diet.           Nutrition Goals Discharge (Final Nutrition Goals Re-Evaluation):     Nutrition Goals Re-Evaluation - 10/26/16 0923      Goals   Comment Kamaile continues to decline a nutrition appointment.  She states that she knows what to eat and how to stick with a diet.  She admits to cheating her diet twice a week with tacos from her boyfriend's food truck.  She knows that she is retaining some fluid and is trying to stay away from salt and read labels to help.   Expected Outcome Short: Continue to read labels and watch salt.  Long: Continue with heart healthy diet.       Psychosocial: Target Goals: Acknowledge presence or absence of significant depression and/or stress, maximize coping skills, provide positive support system. Participant is able to verbalize types and ability to use techniques and skills needed for reducing stress and depression.   Initial Review & Psychosocial Screening:     Initial Psych Review & Screening - 09/19/16 1246      Initial Review   Comments Rissa reports that she got down to 200lbs but when all this happened she "got depressed and just ate up to 240lbs now. Aleaha reports that she is suppose to get a genetic test since many anti-depressants are on the list that possibly her body doesnt' break down correctly. We discussed that even thought she needs knee surgery after her torn menisus that exercise usually helps with depression. Alesia Banda states that she doesn't want to kill herself but she "wants to buy a train ticket and leave far away but tthey will find me. " Tanasha reports that her Mother lives with her since her Mother is a Retired Marine scientist. She also lives with her boyfriend and his daughter and his daughter's 2 children. Plus there are other children there 4 days away who they babysit.       Quality of Life Scores:      Quality of  Life - 09/19/16 1311      Quality of Life Scores   Health/Function Pre 3.8 %   Socioeconomic Pre 5.56 %   Psych/Spiritual Pre 5.36 %   Family Pre 2.4 %   GLOBAL Pre 4.31 %      PHQ-9: Recent Review Flowsheet Data    Depression screen [facility] 2/9 09/19/2016   Decreased Interest 3   Down, Depressed, Hopeless 2   PHQ - 2 Score 5   Altered sleeping 3   Tired, decreased energy 3   Change in appetite 3   Feeling bad or failure about yourself  2   Trouble concentrating 2   Moving slowly or fidgety/restless 2   Suicidal thoughts 0   PHQ-9 Score 20   Difficult doing work/chores Very difficult     Interpretation of Total Score  Total Score Depression Severity:  1-4 = Minimal depression, 5-9 = Mild depression, 10-14 = Moderate depression, 15-19 = Moderately severe depression,  20-27 = Severe depression   Psychosocial Evaluation and Intervention:     Psychosocial Evaluation - 10/03/16 1000      Psychosocial Evaluation & Interventions   Interventions Encouraged to exercise with the program and follow exercise prescription;Stress management education;Therapist referral;Relaxation education   Comments Counselor met with Ms. Elicia Lamp Sharrie Rothman) today for initial psychosocial evaluation.  She is a 51 year old who had an aortic valve replacement several months ago.  Alnita also has multiple health issues with diabetes; Lupus; a miniscus tear in her right knee on top of mental health issues.  Xanthe has a strong support system with a significant other of (2) years whom she lives with and her mother as well.  She has other relatives who live locally.  Mikea reports having sleep difficulties with approximately 3-4 hours of 'good sleep.  Her appetite is up and down - and varies with her mood.  Jupiter reports a history of depression and anxiety and is on 80 mg of Prozac since last Fall - but states it is "not working."  She has tried other medications for her mood but is very sensitive to medications and none have  been extremely helpful.  She has multiple stressors with her health; conflict in the family with her mother and Significant other's daughter and (2) small children who live with Sharrie Rothman.  She has a PHQ-9 score of 20 which indicates severe depression.  She denies homicidal or suicidal ideations.   She reports she is not being followed with a psychiatrist currently and counselor gave her a recommendation to try to see if her insurance will cover.  Counselor recommended consistent exercise to see if this helps with mood and sleep somewhat.; and to see the psychiatrist.  Counselor will also recommend a therapist for client locally to help with mood; thoughts and improved coping strategies.  Counselor will be following with Brynnan throughout the course of this program.     Expected Outcomes Kairee will benefit from consistent exercise to achieve her stated goals of "getting my life back."  Also this will help improve her mood and possible sleep issues.  Counselor recommended Sonam sees a psychiatrist to address her medications not being helpful at this time.  Counselor will provide stress management and depression education for the class over the next few months to help Ashara learn to develop positive coping strategies.  Counselor will follow.     Continue Psychosocial Services  Follow up required by counselor      Psychosocial Re-Evaluation:     Psychosocial Re-Evaluation    Quilcene Name 09/26/16 [phone] 10/26/16 0921           Psychosocial Re-Evaluation   Comments Laurelyn left a vm on the Cardiac Rehab phone vm that she would not be able to attend today since she is sick. Counselor follow up with Sharrie Rothman today.  She reports having fallen down her cement steps several weeks ago and had to be taken to the hospital.  She has some noticeable bruising and some ongoing back pain, but was able to come back to class today.  Ashleymarie continues to struggle with anxiety and is on the schedule to see a psychiatrist on 8/16.  Her PCP is  weaning her off the Prozac since it "is not working."  She is more irritable lately but attributes that to not smoking more than decreased medication.  Marriah reports she is more relaxed in her body and mind when she comes to class or exercises and will walk  on her off days to continue this.  She is coping by eating more; isolating and walking currently.  Counselor discussed positive self care with Junella - trying deep breathing and mindfulness strategies or visualization when she is feeling anxious or stressed.  Counselor commended her for continuing to exercise - even on her "off days" from this class - and for not smoking during this stressful time.  Counselor will follow with her after she sees the psychiatrist and will help her find a counselor if recommended by Dr.        Noberto Retort Outcomes  - Ariyah will continue to exercise consistently for her overall health.  She will see the psychiatrist for her mental health mood needs.  She will practice positive self care in the meantime.      Interventions  - Stress management education;Relaxation education      Continue Psychosocial Services   - Follow up required by counselor         Psychosocial Discharge (Final Psychosocial Re-Evaluation):     Psychosocial Re-Evaluation - 10/26/16 0921      Psychosocial Re-Evaluation   Comments Counselor follow up with Sharrie Rothman today.  She reports having fallen down her cement steps several weeks ago and had to be taken to the hospital.  She has some noticeable bruising and some ongoing back pain, but was able to come back to class today.  Anniya continues to struggle with anxiety and is on the schedule to see a psychiatrist on 8/16.  Her PCP is weaning her off the Prozac since it "is not working."  She is more irritable lately but attributes that to not smoking more than decreased medication.  Arleene reports she is more relaxed in her body and mind when she comes to class or exercises and will walk on her off days to continue this.   She is coping by eating more; isolating and walking currently.  Counselor discussed positive self care with Reagann - trying deep breathing and mindfulness strategies or visualization when she is feeling anxious or stressed.  Counselor commended her for continuing to exercise - even on her "off days" from this class - and for not smoking during this stressful time.  Counselor will follow with her after she sees the psychiatrist and will help her find a counselor if recommended by Dr.     Noberto Retort Outcomes Nasha will continue to exercise consistently for her overall health.  She will see the psychiatrist for her mental health mood needs.  She will practice positive self care in the meantime.   Interventions Stress management education;Relaxation education   Continue Psychosocial Services  Follow up required by counselor      Vocational Rehabilitation: Provide vocational rehab assistance to qualifying candidates.   Vocational Rehab Evaluation & Intervention:     Vocational Rehab - 09/19/16 1241      Initial Vocational Rehab Evaluation & Intervention   Assessment shows need for Vocational Rehabilitation Yes      Education: Education Goals: Education classes will be provided on a variety of topics geared toward better understanding of heart health and risk factor modification. Participant will state understanding/return demonstration of topics presented as noted by education test scores.  Learning Barriers/Preferences:     Learning Barriers/Preferences - 09/19/16 1240      Learning Barriers/Preferences   Learning Barriers None   Learning Preferences None      Education Topics: General Nutrition Guidelines/Fats and Fiber: -Group instruction provided by verbal, written material, models and posters  to present the general guidelines for heart healthy nutrition. Gives an explanation and review of dietary fats and fiber.   Controlling Sodium/Reading Food Labels: -Group verbal and written  material supporting the discussion of sodium use in heart healthy nutrition. Review and explanation with models, verbal and written materials for utilization of the food label.   Exercise Physiology & Risk Factors: - Group verbal and written instruction with models to review the exercise physiology of the cardiovascular system and associated critical values. Details cardiovascular disease risk factors and the goals associated with each risk factor.   Cardiac Rehab from 11/07/2016 in [facility] Cardiac and Pulmonary Rehab  Date  10/03/16  Educator  [facility]  Instruction Review Code (retired)  2- meets goals/outcomes      Aerobic Exercise & Resistance Training: - Gives group verbal and written discussion on the health impact of inactivity. On the components of aerobic and resistive training programs and the benefits of this training and how to safely progress through these programs.   Flexibility, Balance, General Exercise Guidelines: - Provides group verbal and written instruction on the benefits of flexibility and balance training programs. Provides general exercise guidelines with specific guidelines to those with heart or lung disease. Demonstration and skill practice provided.   Cardiac Rehab from 11/07/2016 in [facility] Cardiac and Pulmonary Rehab  Date  10/10/16  Educator  [facility]  Instruction Review Code (retired)  2- meets goals/outcomes      Stress Management: - Provides group verbal and written instruction about the health risks of elevated stress, cause of high stress, and healthy ways to reduce stress.   Depression: - Provides group verbal and written instruction on the correlation between heart/lung disease and depressed mood, treatment options, and the stigmas associated with seeking treatment.   Cardiac Rehab from 11/07/2016 in [facility] Cardiac and Pulmonary Rehab  Date  09/21/16  Educator  [facility]  Instruction Review Code (retired)  2- meets Designer, fashion/clothing & Physiology of the  Heart: - Group verbal and written instruction and models provide basic cardiac anatomy and physiology, with the coronary electrical and arterial systems. Review of: AMI, Angina, Valve disease, Heart Failure, Cardiac Arrhythmia, Pacemakers, and the ICD.   Cardiac Procedures: - Group verbal and written instruction to review commonly prescribed medications for heart disease. Reviews the medication, class of the drug, and side effects. Includes the steps to properly store meds and maintain the prescription regimen. (beta blockers and nitrates)   Cardiac Rehab from 11/07/2016 in [facility] Cardiac and Pulmonary Rehab  Date  10/24/16  Educator  CE  Instruction Review Code (retired)  2- meets goals/outcomes      Cardiac Medications I: - Group verbal and written instruction to review commonly prescribed medications for heart disease. Reviews the medication, class of the drug, and side effects. Includes the steps to properly store meds and maintain the prescription regimen.   Cardiac Rehab from 11/07/2016 in [facility] Cardiac and Pulmonary Rehab  Date  10/31/16 [10/31/16 Part 1 11/02/16 Part 2]  Educator  Ce  Instruction Review Code (retired)  2- meets goals/outcomes      Cardiac Medications II: -Group verbal and written instruction to review commonly prescribed medications for heart disease. Reviews the medication, class of the drug, and side effects. (all other drug classes)    Go Sex-Intimacy & Heart Disease, Get SMART - Goal Setting: - Group verbal and written instruction through game format to discuss heart disease and the return to sexual intimacy. Provides group verbal  and written material to discuss and apply goal setting through the application of the S.M.A.R.T. Method.   Cardiac Rehab from 11/07/2016 in [facility] Cardiac and Pulmonary Rehab  Date  10/24/16  Educator  CE  Instruction Review Code (retired)  2- meets goals/outcomes      Other Matters of the Heart: - Provides group verbal, written  materials and models to describe Heart Failure, Angina, Valve Disease, Peripheral Artery Disease, and Diabetes in the realm of heart disease. Includes description of the disease process and treatment options available to the cardiac patient.   Exercise & Equipment Safety: - Individual verbal instruction and demonstration of equipment use and safety with use of the equipment.   Cardiac Rehab from 11/07/2016 in [facility] Cardiac and Pulmonary Rehab  Date  09/19/16  Educator  C. ENterkinRN  Instruction Review Code (retired)  1- partially meets, needs review/practice      Infection Prevention: - Provides verbal and written material to individual with discussion of infection control including proper hand washing and proper equipment cleaning during exercise session.   Cardiac Rehab from 11/07/2016 in [facility] Cardiac and Pulmonary Rehab  Date  09/19/16  Educator  C. Hadden Steig, R.N.  Instruction Review Code (retired)  2- meets [employer] Prevention: - Provides verbal and written material to individual with discussion of falls prevention and safety.   Cardiac Rehab from 11/07/2016 in [facility] Cardiac and Pulmonary Rehab  Date  09/19/16  Educator  C. [place]  Instruction Review Code (retired)  2- meets goals/outcomes      Diabetes: - Individual verbal and written instruction to review signs/symptoms of diabetes, desired ranges of glucose level fasting, after meals and with exercise. Acknowledge that pre and post exercise glucose checks will be done for 3 sessions at entry of program.   Cardiac Rehab from 11/07/2016 in [facility] Cardiac and Pulmonary Rehab  Date  09/19/16  Educator  C. Aniyla Harling, RN  Instruction Review Code (retired)  1- partially meets, needs review/practice      Other: -Provides group and verbal instruction on various topics (see comments)    Knowledge Questionnaire Score:     Knowledge Questionnaire Score - 09/21/16 0915      Knowledge Questionnaire Score   Pre  Score 19/28  Reviewed results with patient today.        Core Components/Risk Factors/Patient Goals at Admission:     Personal Goals and Risk Factors at Admission - 09/19/16 1214      Core Components/Risk Factors/Patient Goals on Admission    Weight Management Yes;Obesity   Intervention Weight Management: Develop a combined nutrition and exercise program designed to reach desired caloric intake, while maintaining appropriate intake of nutrient and fiber, sodium and fats, and appropriate energy expenditure required for the weight goal.;Weight Management: Provide education and appropriate resources to help participant work on and attain dietary goals.;Weight Management/Obesity: Establish reasonable short term and long term weight goals.;Obesity: Provide education and appropriate resources to help participant work on and attain dietary goals.   Admit Weight 241 lb 14.4 oz (109.7 kg)   Goal Weight: Short Term 235 lb (106.6 kg)   Goal Weight: Long Term 200 lb (90.7 kg)   Expected Outcomes Short Term: Continue to assess and modify interventions until short term weight is achieved;Weight Loss: Understanding of general recommendations for a balanced deficit meal plan, which promotes 1-2 lb weight loss per week and includes a negative energy balance of [phone] kcal/d;Understanding recommendations for meals to include 15-35% energy as  protein, 25-35% energy from fat, 35-60% energy from carbohydrates, less than 258m of dietary cholesterol, 20-35 gm of total fiber daily;Long Term: Adherence to nutrition and physical activity/exercise program aimed toward attainment of established weight goal;Understanding of distribution of calorie intake throughout the day with the consumption of 4-5 meals/snacks   Tobacco Cessation Yes   Intervention Assist the participant in steps to quit. Provide individualized education and counseling about committing to Tobacco Cessation, relapse prevention, and pharmacological support  that can be provided by physician.   Expected Outcomes Long Term: Complete abstinence from all tobacco products for at least 12 months from quit date.   Improve shortness of breath with ADL's Yes   Intervention Provide education, individualized exercise plan and daily activity instruction to help decrease symptoms of SOB with activities of daily living.   Expected Outcomes Short Term: Achieves a reduction of symptoms when performing activities of daily living.   Diabetes Yes   Intervention Provide education about signs/symptoms and action to take for hypo/hyperglycemia.;Provide education about proper nutrition, including hydration, and aerobic/resistive exercise prescription along with prescribed medications to achieve blood glucose in normal ranges: Fasting glucose 65-99 mg/dL   Expected Outcomes Short Term: Participant verbalizes understanding of the signs/symptoms and immediate care of hyper/hypoglycemia, proper foot care and importance of medication, aerobic/resistive exercise and nutrition plan for blood glucose control.;Long Term: Attainment of HbA1C < 7%.   Hypertension Yes   Intervention Provide education on lifestyle modifcations including regular physical activity/exercise, weight management, moderate sodium restriction and increased consumption of fresh fruit, vegetables, and low fat dairy, alcohol moderation, and smoking cessation.;Monitor prescription use compliance.   Expected Outcomes Short Term: Continued assessment and intervention until BP is < 140/991mHG in hypertensive participants. < 130/8055mG in hypertensive participants with diabetes, heart failure or chronic kidney disease.;Long Term: Maintenance of blood pressure at goal levels.   Lipids Yes   Intervention Provide education and support for participant on nutrition & aerobic/resistive exercise along with prescribed medications to achieve LDL <81m40mDL >40mg22mExpected Outcomes Short Term: Participant states understanding of  desired cholesterol values and is compliant with medications prescribed. Participant is following exercise prescription and nutrition guidelines.;Long Term: Cholesterol controlled with medications as prescribed, with individualized exercise RX and with personalized nutrition plan. Value goals: LDL < 81mg,55m > 40 mg.   Stress Yes   Intervention Offer individual and/or small group education and counseling on adjustment to heart disease, stress management and health-related lifestyle change. Teach and support self-help strategies.;Refer participants experiencing significant psychosocial distress to appropriate mental health specialists for further evaluation and treatment. When possible, include family members and significant others in education/counseling sessions.   Expected Outcomes Short Term: Participant demonstrates changes in health-related behavior, relaxation and other stress management skills, ability to obtain effective social support, and compliance with psychotropic medications if prescribed.;Long Term: Emotional wellbeing is indicated by absence of clinically significant psychosocial distress or social isolation.      Core Components/Risk Factors/Patient Goals Review:      Goals and Risk Factor Review    Row Name 10/26/16 0912             Core Components/Risk Factors/Patient Goals Review   Personal Goals Review Weight Management/Obesity;Improve shortness of breath with ADL's;Tobacco Cessation;Hypertension;Lipids;Diabetes       Review Cristle hKaylinaeen not been able to lose much weight as she has not been able to exercise since her fall.   She still has refrained from smoking and makes her family members  go outside to smoke!  She has been doing well with her blood pressures and blood sugars.  She routinely checks them at home.  She has been doing well with most of her medications (see note in pyschosocial for prozac not working).  She is interested in sticking with the program to continue  to work on risk factor modifications.       Expected Outcomes Short: Continue to stay away from smoking. Long: Continue to work on risk factor modification.          Core Components/Risk Factors/Patient Goals at Discharge (Final Review):      Goals and Risk Factor Review - 10/26/16 0912      Core Components/Risk Factors/Patient Goals Review   Personal Goals Review Weight Management/Obesity;Improve shortness of breath with ADL's;Tobacco Cessation;Hypertension;Lipids;Diabetes   Review Keyara has been not been able to lose much weight as she has not been able to exercise since her fall.   She still has refrained from smoking and makes her family members go outside to smoke!  She has been doing well with her blood pressures and blood sugars.  She routinely checks them at home.  She has been doing well with most of her medications (see note in pyschosocial for prozac not working).  She is interested in sticking with the program to continue to work on risk factor modifications.   Expected Outcomes Short: Continue to stay away from smoking. Long: Continue to work on risk factor modification.      ITP Comments:     ITP Comments    Row Name 09/19/16 1244 09/19/16 1311 09/20/16 1323 09/26/16 0926 10/07/16 0853   ITP Comments Chamika reports she has Lupus, Rheumatoid Arthritis, Fibryomyalgia. 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 Laraine reports that she got down to 200lbs but when all this happened she "got depressed and just ate up to 240lbs now. " Oretta reports that she has had diabetes for 5 years.  ITP was created during Medical Review after Cardiac Rehab informed consent was signed. DX EPIC/CHL discharge summary I left Jannette a vm that she can get a free physical therapy screening for her knee problem. She can call [phone] or check in at the  front desk.  Darcella left a vm on the Cardiac Rehab phone vm that she would not be able to attend today since she is sick. Jordynne is wearing a 30 day holter monitor.  SHe has had a few medication changes   Row Name 10/12/16 0631 10/12/16 1600 11/09/16 0608 11/18/16 1406 11/30/16 1529   ITP Comments 30 day review. Continue with ITP unless directed changes per Medical Director review   New to program Sonjia called to let us know that she has been out because she fell down concrete steps at home.  She was evaluated by the ER.  She also has a follow up appointment on Friday and will be out of class.  30 day review. Continue with ITP unless directed changes per Medical Director review  Called to check on status of return.  She fell again at home and knee swelled up again.  She is covered in bruises.  She hopes to return next week.  She wants to stay off the treadmill for a week or so.  Called to check on status of return.  Left message.  Out since 11/07/16   Row Name 12/07/16 1100           ITP Comments 30 day review. Continue with ITP unless directed changes per Medical Director review.            Comments:

## 2016-12-13 ENCOUNTER — Emergency Department: Payer: Managed Care, Other (non HMO)

## 2016-12-13 ENCOUNTER — Emergency Department
Admission: EM | Admit: 2016-12-13 | Discharge: 2016-12-13 | Disposition: A | Discharge status: Home / Self Care | Payer: Managed Care, Other (non HMO) | Attending: Emergency Medicine | Admitting: Emergency Medicine

## 2016-12-13 ENCOUNTER — Other Ambulatory Visit: Payer: Self-pay

## 2016-12-13 DIAGNOSIS — E119 Type 2 diabetes mellitus without complications: Secondary | ICD-10-CM | POA: Diagnosis not present

## 2016-12-13 DIAGNOSIS — J449 Chronic obstructive pulmonary disease, unspecified: Secondary | ICD-10-CM | POA: Diagnosis not present

## 2016-12-13 DIAGNOSIS — M3210 Systemic lupus erythematosus, organ or system involvement unspecified: Secondary | ICD-10-CM | POA: Insufficient documentation

## 2016-12-13 DIAGNOSIS — K209 Esophagitis, unspecified without bleeding: Secondary | ICD-10-CM

## 2016-12-13 DIAGNOSIS — I10 Essential (primary) hypertension: Secondary | ICD-10-CM | POA: Diagnosis not present

## 2016-12-13 DIAGNOSIS — R51 Headache: Secondary | ICD-10-CM | POA: Insufficient documentation

## 2016-12-13 DIAGNOSIS — Z7901 Long term (current) use of anticoagulants: Secondary | ICD-10-CM | POA: Insufficient documentation

## 2016-12-13 DIAGNOSIS — R079 Chest pain, unspecified: Secondary | ICD-10-CM | POA: Insufficient documentation

## 2016-12-13 DIAGNOSIS — Z79899 Other long term (current) drug therapy: Secondary | ICD-10-CM | POA: Insufficient documentation

## 2016-12-13 DIAGNOSIS — Z7984 Long term (current) use of oral hypoglycemic drugs: Secondary | ICD-10-CM | POA: Diagnosis not present

## 2016-12-13 DIAGNOSIS — I2510 Atherosclerotic heart disease of native coronary artery without angina pectoris: Secondary | ICD-10-CM | POA: Diagnosis not present

## 2016-12-13 DIAGNOSIS — Z87891 Personal history of nicotine dependence: Secondary | ICD-10-CM | POA: Diagnosis not present

## 2016-12-13 LAB — HEPATIC FUNCTION PANEL
ALBUMIN: 3.9 g/dL (ref 3.5–5.0)
ALT: 20 U/L (ref 14–54)
AST: 24 U/L (ref 15–41)
Alkaline Phosphatase: 57 U/L (ref 38–126)
BILIRUBIN TOTAL: 0.2 mg/dL — AB (ref 0.3–1.2)
Bilirubin, Direct: <0.1 mg/dL — ABNORMAL LOW (ref 0.1–0.5)
Total Protein: 6.9 g/dL (ref 6.5–8.1)

## 2016-12-13 LAB — CBC
HCT: 31.6 % — ABNORMAL LOW (ref 35.0–47.0)
HEMOGLOBIN: 9.8 g/dL — AB (ref 12.0–16.0)
MCH: 21.4 pg — ABNORMAL LOW (ref 26.0–34.0)
MCHC: 31 g/dL — AB (ref 32.0–36.0)
MCV: 69.1 fL — AB (ref 80.0–100.0)
PLATELETS: 232 10*3/uL (ref 150–440)
RBC: 4.58 MIL/uL (ref 3.80–5.20)
RDW: 18.7 % — ABNORMAL HIGH (ref 11.5–14.5)
WBC: 8.6 10*3/uL (ref 3.6–11.0)

## 2016-12-13 LAB — FIBRIN DERIVATIVES D-DIMER (ARMC ONLY): Fibrin derivatives D-dimer (ARMC): 372.85 (ref 0.00–499.00)

## 2016-12-13 LAB — TROPONIN I: Troponin I: <0.03 ng/mL (ref <0.03)

## 2016-12-13 LAB — BASIC METABOLIC PANEL
Anion gap: 6 (ref 5–15)
BUN: 17 mg/dL (ref 6–20)
CALCIUM: 9 mg/dL (ref 8.9–10.3)
CO2: 25 mmol/L (ref 22–32)
CREATININE: 1.01 mg/dL — AB (ref 0.44–1.00)
Chloride: 107 mmol/L (ref 101–111)
GFR calc Af Amer: >60 mL/min (ref >60)
GFR calc non Af Amer: >60 mL/min (ref >60)
GLUCOSE: 113 mg/dL — AB (ref 65–99)
Potassium: 4.6 mmol/L (ref 3.5–5.1)
Sodium: 138 mmol/L (ref 135–145)

## 2016-12-13 LAB — PROTIME-INR
INR: 3.53
Prothrombin Time: 35.1 seconds — ABNORMAL HIGH (ref 11.4–15.2)

## 2016-12-13 LAB — LIPASE, BLOOD: LIPASE: 33 U/L (ref 11–51)

## 2016-12-13 MED ORDER — GI COCKTAIL ~~LOC~~
30.0000 mL | Freq: Once | ORAL | Status: AC
  Administered 2016-12-13: 30 mL via ORAL
  Filled 2016-12-13: qty 30

## 2016-12-13 MED ORDER — PROCHLORPERAZINE EDISYLATE 5 MG/ML IJ SOLN
10.0000 mg | Freq: Once | INTRAMUSCULAR | Status: AC
  Administered 2016-12-13: 10 mg via INTRAVENOUS
  Filled 2016-12-13: qty 2

## 2016-12-13 MED ORDER — SUCRALFATE 1 G PO TABS: 1.0000 g | ORAL_TABLET | Freq: Four times a day (QID) | ORAL | 0 refills | Status: DC

## 2016-12-13 MED ORDER — FAMOTIDINE 40 MG PO TABS: 40.0000 mg | ORAL_TABLET | Freq: Every evening | ORAL | 1 refills | Status: DC

## 2016-12-13 NOTE — Discharge Instructions (Signed)
Please seek medical attention for any high fevers, chest pain, shortness of breath, change in behavior, persistent vomiting, bloody stool or any other new or concerning symptoms.  

## 2016-12-13 NOTE — ED Notes (Signed)
Resumed care from sheri rn.  Pt alert. States feeling better.  Pt sitting in a chair.  Skin warm and dry.Marland Kitchen

## 2016-12-13 NOTE — ED Triage Notes (Signed)
Pt c/o chest tightness that began this AM, radiating towards back. Pt also c/o decreased left arm sensation X 2 days. Awoke with headache to posterior part of head. Able to move left arm, color WNL. Pt alert and oriented X4, active, cooperative, pt in NAD. RR even and unlabored, color WNL.

## 2016-12-13 NOTE — ED Notes (Signed)
Pt c/o chest tightness that began this AM, radiating towards back. Pt also c/o decreased left arm sensation X 2 days. Awoke with headache to posterior part of head. Able to move left arm, color WNL. Pt alert and oriented X4, active, cooperative, pt in NAD.

## 2016-12-13 NOTE — ED Provider Notes (Signed)
[facility] Emergency Department Provider Note   ____________________________________________   I have reviewed the triage vital signs and the nursing notes.   HISTORY  Chief Complaint Chest pain  History limited by: Not Limited   HPI [name] is a 51 y.o. female who presents to the emergency department today with primary complaint of chest pain. She states that she has had chest tightness for roughly the past 5 days. She describes it as being located across her upper chest. She happened to be at [facility] today dropping something off for a friend when she started having sharp chest pain. The sharp chest pain was located lower in her chest. This did radiate to her back. In addition to the chest pain/tightness the patient states she has been having a headache. She was seen in the emergency department earlier this month for a headache which had been present since a fall roughly two months ago. Furthermore she has complaint of some left arm numbness for a few days.    Past Medical History:  Diagnosis Date  . CAD (coronary artery disease)    per cardiac MRI  . Diabetes mellitus without complication (HCC)   . Hypertension   . Lupus (systemic lupus erythematosus) (HCC)     Patient Active Problem List   Diagnosis Date Noted  . 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 Atrial fibrillation (HCC) 07/02/2016  . Anxiety, generalized 06/27/2016  . Chest pain 04/21/2016  . 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 ASHD (arteriosclerotic heart disease) 11/02/2011  . Chronic back pain 11/02/2011    Past Surgical History:  Procedure Laterality Date  . APPENDECTOMY    . CESAREAN SECTION    . KNEE ARTHROSCOPY    . TONSILLECTOMY    . VALVE REPLACEMENT      Prior to Admission medications   Medication Sig Start Date End Date Taking? Authorizing Provider  acetaminophen (TYLENOL) 500 MG tablet Take 500  mg by mouth every 6 (six) hours as needed.    [provider]  acetaminophen-codeine (TYLENOL #3) 300-30 MG tablet TAKE 1-2 TABLET EVERY 8HOURS AS NEEDED FOR PAIN, MAX 4 TABS/DAY 07/21/16   [provider]  albuterol (PROVENTIL HFA;VENTOLIN HFA) 108 (90 Base) MCG/ACT inhaler Inhale 1-2 puffs into the lungs every 6 (six) hours as needed for wheezing or shortness of breath.    [provider]  amLODipine (NORVASC) 5 MG tablet Take 5 mg by mouth daily.    [provider]  aspirin 81 MG tablet Take 81 mg by mouth daily.    [provider]  baclofen (LIORESAL) 10 MG tablet Take 10 mg by mouth 2 (two) times daily.    [provider]  budesonide-formoterol (SYMBICORT) 160-4.5 MCG/ACT inhaler Inhale 2 puffs into the lungs 2 (two) times daily. 01/14/16 01/13/17  [provider]  budesonide-formoterol (SYMBICORT) 160-4.5 MCG/ACT inhaler Inhale into the lungs. 01/14/16 01/13/17  [provider]  dicyclomine (BENTYL) 20 MG tablet Take 1 tablet (20 mg total) by mouth 3 (three) times daily as needed for spasms. Patient not taking: Reported on 04/21/2016 05/14/15 05/13/16  Myrna Blazer, MD  FLUoxetine (PROZAC) 40 MG capsule Take 40 mg by mouth 2 (two) times daily.     [provider]  furosemide (LASIX) 40 MG tablet Take by mouth. 07/18/16 07/18/17  [provider]  ibuprofen (ADVIL,MOTRIN) 600 MG tablet Take 1 tablet (600 mg total) by mouth 3 (three)  times daily. Patient not taking: Reported on 09/19/2016 04/21/16   Adrian Saran, MD  lidocaine (LIDODERM) 5 % Place 1 patch onto the skin every 12 (twelve) hours. Remove & Discard patch within 12 hours or as directed by MD 10/17/16 10/17/17  Menshew, Charlesetta Ivory, PA-C  metFORMIN (GLUCOPHAGE) 500 MG tablet Take by mouth 2 (two) times daily with a meal.    [provider]  methocarbamol (ROBAXIN) 500 MG tablet Take 1 tablet (500 mg total) by mouth 2 (two) times  daily. Patient not taking: Reported on 10/11/2016 01/06/15   Payton Mccallum, MD  metoprolol tartrate (LOPRESSOR) 25 MG tablet Take 25 mg by mouth 2 (two) times daily.    [provider]  omeprazole (PRILOSEC) 40 MG capsule Take 40 mg by mouth daily.    [provider]  potassium chloride SA (K-DUR,KLOR-CON) 20 MEQ tablet Take 20 mEq by mouth once.    [provider]  pravastatin (PRAVACHOL) 20 MG tablet Take 20 mg by mouth daily.    [provider]  ranitidine (ZANTAC) 300 MG tablet Take 300 mg by mouth at bedtime.    [provider]  sennosides-docusate sodium (SENOKOT-S) 8.6-50 MG tablet Take 1 tablet by mouth at bedtime.    [provider]  Vitamin D, Ergocalciferol, (DRISDOL) 50000 units CAPS capsule Take 50,000 Units by mouth every 7 (seven) days.    [provider]  warfarin (COUMADIN) 5 MG tablet Take 5 mg by mouth daily.    [provider]    Allergies Flexeril [cyclobenzaprine]; Celecoxib; Gabapentin; Iodinated diagnostic agents; Morphine and related; Percocet [oxycodone-acetaminophen]; Skelaxin [metaxalone]; Demerol [meperidine]; Nortriptyline; Tramadol; Ketorolac; and Norflex [orphenadrine citrate]  Family History  Problem Relation Age of Onset  . Hyperlipidemia Mother   . Hypertension Father   . Diabetes Father   . Hyperlipidemia Father     Social History Social History  Substance Use Topics  . Smoking status: Former Smoker    Packs/day: 0.50    Years: 15.00    Types: Cigarettes    Quit date: 06/26/2016  . Smokeless tobacco: Never Used  . Alcohol use No    Review of Systems Constitutional: No fever/chills Eyes: No visual changes. ENT: No sore throat. Cardiovascular: Positive for chest pain. Respiratory: Positive for shortness of breath. Gastrointestinal: No abdominal pain.  No nausea, no vomiting.  No diarrhea.   Genitourinary: Negative for dysuria. Musculoskeletal: Positive for back  pain. Skin: Negative for rash. Neurological: Positive for headache.   ____________________________________________   PHYSICAL EXAM:  VITAL SIGNS: ED Triage Vitals  Enc Vitals Group     BP 12/13/16 1233 133/79     Pulse Rate 12/13/16 1233 77     Resp 12/13/16 1233 18     Temp 12/13/16 1233 98.5 F (36.9 C)     Temp Source 12/13/16 1233 Oral     SpO2 12/13/16 1233 96 %     Weight 12/13/16 1233 250 lb (113.4 kg)     Height 12/13/16 1233  (1.499 m)     Head Circumference --      Peak Flow --      Pain Score 12/13/16 1232 8   Constitutional: Alert and oriented. Well appearing and in no distress. Eyes: Conjunctivae are normal.  ENT   Head: Normocephalic and atraumatic.   Nose: No congestion/rhinnorhea.   Mouth/Throat: Mucous membranes are moist.   Neck: No stridor. Hematological/Lymphatic/Immunilogical: No cervical lymphadenopathy. Cardiovascular: Normal rate, regular rhythm.  No murmurs, rubs, or gallops.  Respiratory: Normal respiratory effort without tachypnea nor retractions. Breath sounds are clear and equal bilaterally. No wheezes/rales/rhonchi. Gastrointestinal: Soft and non tender. No rebound. No guarding.  Genitourinary: Deferred Musculoskeletal: Normal range of motion in all extremities. No lower extremity edema. Neurologic:  Normal speech and language. No gross focal neurologic deficits are appreciated.  Skin:  Skin is warm, dry and intact. No rash noted. Psychiatric: Mood and affect are normal. Speech and behavior are normal. Patient exhibits appropriate insight and judgment.  ____________________________________________    LABS (pertinent positives/negatives)  Labs Reviewed  BASIC METABOLIC PANEL - Abnormal; Notable for the following:       Result Value   Glucose, Bld 113 (*)    Creatinine, Ser 1.01 (*)    All other components within normal limits  CBC - Abnormal; Notable for the following:    Hemoglobin 9.8 (*)    HCT 31.6 (*)    MCV  69.1 (*)    MCH 21.4 (*)    MCHC 31.0 (*)    RDW 18.7 (*)    All other components within normal limits  PROTIME-INR - Abnormal; Notable for the following:    Prothrombin Time 35.1 (*)    All other components within normal limits  HEPATIC FUNCTION PANEL - Abnormal; Notable for the following:    Total Bilirubin 0.2 (*)    Bilirubin, Direct <0.1 (*)    All other components within normal limits  TROPONIN I  FIBRIN DERIVATIVES D-DIMER ([facility] ONLY)  LIPASE, BLOOD     ____________________________________________   EKG  IPhineas Semenodman, attending physician, personally viewed and interpreted this EKG  EKG Time: 1231 Rate: 79 Rhythm: sinus rhythm Axis: normal Intervals: qtc 499 QRS: LBBB ST changes: no st elevation Impression: abnormal ekg   ____________________________________________    RADIOLOGY  CXR IMPRESSION:  Mild CHF without pulmonary edema.     ____________________________________________   PROCEDURES  Procedures  ____________________________________________   INITIAL IMPRESSION / ASSESSMENT AND PLAN / ED COURSE  Pertinent labs & imaging results that were available during my care of the patient were reviewed by me and considered in my medical decision making (see chart for details).  Patient presented to the emergency department today with chest pain. D-dimer and troponin and blood work without concerning findings. The patient did feel better after GI cocktail. This point I think esophagitis/gastritis likely. Will give patient prescription for sucralfate and Pepcid.  ____________________________________________   FINAL CLINICAL IMPRESSION(S) / ED DIAGNOSES  Final diagnoses:  Nonspecific chest pain  Esophagitis     Note: This dictation was prepared with Dragon dictation. Any transcriptional errors that result from this process are unintentional     Phineas Semen, MD 12/13/16 2053

## 2016-12-16 ENCOUNTER — Encounter: Payer: Self-pay | Admitting: *Deleted

## 2016-12-16 ENCOUNTER — Telehealth: Payer: Self-pay | Admitting: *Deleted

## 2016-12-16 DIAGNOSIS — Z952 Presence of prosthetic heart valve: Secondary | ICD-10-CM

## 2016-12-16 NOTE — Telephone Encounter (Signed)
Called to check on status of return. [name] is starting to feel better.  She has left message with cardiology.  Her knee continues to get worse and needs a total knee replacement.  She starts that on Tuesday.  She will have to discharge at this time.

## 2016-12-16 NOTE — Progress Notes (Signed)
Discharge Progress Report  Patient Details  Name: [name] MRN: [id number] Date of Birth: [birth year] Referring Provider:     Cardiac Rehab from 09/19/2016 in [facility]  Referring Provider  Ward, Georga Hacking MD       Number of Visits: 17/36  Reason for Discharge:  Early Exit:  Personal and physician advice  Smoking History:  History  Smoking Status  . Former Smoker  . Packs/day: 0.50  . Years: 15.00  . Types: Cigarettes  . Quit date: 06/26/2016  Smokeless Tobacco  . Never Used    Diagnosis:  Aortic valve replaced  ADL UCSD:   Initial Exercise Prescription:     Initial Exercise Prescription - 09/19/16 1400      Date of Initial Exercise RX and Referring Provider   Date 09/19/16   Referring Provider Ward, Cary MD     Treadmill   MPH 1.7   Grade 0.5   Minutes 15   METs 2.42     NuStep   Level 2   SPM 80   Minutes 15   METs 2.5     Biostep-RELP   Level 2   SPM 50   Minutes 15   METs 2     Prescription Details   Frequency (times per week) 3   Duration Progress to 45 minutes of aerobic exercise without signs/symptoms of physical distress     Intensity   THRR 40-80% of Max Heartrate 112-151   Ratings of Perceived Exertion 11-15   Perceived Dyspnea 0-4     Progression   Progression Continue to progress workloads to maintain intensity without signs/symptoms of physical distress.     Resistance Training   Training Prescription Yes   Weight 3 lbs   Reps 10-15      Discharge Exercise Prescription (Final Exercise Prescription Changes):     Exercise Prescription Changes - 11/15/16 1500      Response to Exercise   Blood Pressure (Admit) 130/74   Blood Pressure (Exercise) 164/82   Blood Pressure (Exit) 140/80   Heart Rate (Admit) 67 bpm   Heart Rate (Exercise) 107 bpm   Heart Rate (Exit) 75 bpm   Rating of Perceived Exertion (Exercise) 12   Symptoms knee pain   Duration Continue with 45 min of aerobic exercise without  signs/symptoms of physical distress.   Intensity THRR unchanged     Progression   Progression Continue to progress workloads to maintain intensity without signs/symptoms of physical distress.   Average METs 3.14     Resistance Training   Training Prescription Yes   Weight 4 lbs   Reps 10-15     Interval Training   Interval Training No     Treadmill   MPH 2.9   Grade 2.5   Minutes 15   METs 4.22     NuStep   Level 5   Minutes 15   METs 3.2     Biostep-RELP   Level 6   Minutes 15   METs 2     Home Exercise Plan   Plans to continue exercise at Home (comment)  walking and going to water aerobics   Frequency Add 2 additional days to program exercise sessions.   Initial Home Exercises Provided 10/07/16      Functional Capacity:     6 Minute Walk    Row Name 09/19/16 1425         6 Minute Walk   Phase Initial     Distance 1075  feet     Walk Time 6 minutes     # of Rest Breaks 0     MPH 2.04     METS 2.8     RPE 12     Perceived Dyspnea  1     VO2 Peak 9.82     Symptoms Yes (comment)     Comments knee pain 8/10 slightly SOB     Resting HR 73 bpm     Resting BP 140/70     Max Ex. HR 111 bpm     Max Ex. BP 164/74     2 Minute Post BP 126/72        Psychological, QOL, Others - Outcomes: PHQ 2/9: Depression screen PHQ 2/9 09/19/2016  Decreased Interest 3  Down, Depressed, Hopeless 2  PHQ - 2 Score 5  Altered sleeping 3  Tired, decreased energy 3  Change in appetite 3  Feeling bad or failure about yourself  2  Trouble concentrating 2  Moving slowly or fidgety/restless 2  Suicidal thoughts 0  PHQ-9 Score 20  Difficult doing work/chores Very difficult    Quality of Life:     Quality of Life - 09/19/16 1311      Quality of Life Scores   Health/Function Pre 3.8 %   Socioeconomic Pre 5.56 %   Psych/Spiritual Pre 5.36 %   Family Pre 2.4 %   GLOBAL Pre 4.31 %      Personal Goals: Goals established at orientation with interventions provided  to work toward goal.     Personal Goals and Risk Factors at Admission - 09/19/16 1214      Core Components/Risk Factors/Patient Goals on Admission    Weight Management Yes;Obesity   Intervention Weight Management: Develop a combined nutrition and exercise program designed to reach desired caloric intake, while maintaining appropriate intake of nutrient and fiber, sodium and fats, and appropriate energy expenditure required for the weight goal.;Weight Management: Provide education and appropriate resources to help participant work on and attain dietary goals.;Weight Management/Obesity: Establish reasonable short term and long term weight goals.;Obesity: Provide education and appropriate resources to help participant work on and attain dietary goals.   Admit Weight 241 lb 14.4 oz (109.7 kg)   Goal Weight: Short Term 235 lb (106.6 kg)   Goal Weight: Long Term 200 lb (90.7 kg)   Expected Outcomes Short Term: Continue to assess and modify interventions until short term weight is achieved;Weight Loss: Understanding of general recommendations for a balanced deficit meal plan, which promotes 1-2 lb weight loss per week and includes a negative energy balance of [phone] kcal/d;Understanding recommendations for meals to include 15-35% energy as protein, 25-35% energy from fat, 35-60% energy from carbohydrates, less than  of dietary cholesterol, 20-35 gm of total fiber daily;Long Term: Adherence to nutrition and physical activity/exercise program aimed toward attainment of established weight goal;Understanding of distribution of calorie intake throughout the day with the consumption of 4-5 meals/snacks   Tobacco Cessation Yes   Intervention Assist the participant in steps to quit. Provide individualized education and counseling about committing to Tobacco Cessation, relapse prevention, and pharmacological support that can be provided by physician.   Expected Outcomes Long Term: Complete abstinence from all  tobacco products for at least 12 months from quit date.   Improve shortness of breath with ADL's Yes   Intervention Provide education, individualized exercise plan and daily activity instruction to help decrease symptoms of SOB with activities of daily living.   Expected Outcomes Short  Term: Achieves a reduction of symptoms when performing activities of daily living.   Diabetes Yes   Intervention Provide education about signs/symptoms and action to take for hypo/hyperglycemia.;Provide education about proper nutrition, including hydration, and aerobic/resistive exercise prescription along with prescribed medications to achieve blood glucose in normal ranges: Fasting glucose 65-99 mg/dL   Expected Outcomes Short Term: Participant verbalizes understanding of the signs/symptoms and immediate care of hyper/hypoglycemia, proper foot care and importance of medication, aerobic/resistive exercise and nutrition plan for blood glucose control.;Long Term: Attainment of HbA1C < 7%.   Hypertension Yes   Intervention Provide education on lifestyle modifcations including regular physical activity/exercise, weight management, moderate sodium restriction and increased consumption of fresh fruit, vegetables, and low fat dairy, alcohol moderation, and smoking cessation.;Monitor prescription use compliance.   Expected Outcomes Short Term: Continued assessment and intervention until BP is < 140/40mm HG in hypertensive participants. < 130/74mm HG in hypertensive participants with diabetes, heart failure or chronic kidney disease.;Long Term: Maintenance of blood pressure at goal levels.   Lipids Yes   Intervention Provide education and support for participant on nutrition & aerobic/resistive exercise along with prescribed medications to achieve LDL 70mg , HDL >40mg .   Expected Outcomes Short Term: Participant states understanding of desired cholesterol values and is compliant with medications prescribed. Participant is  following exercise prescription and nutrition guidelines.;Long Term: Cholesterol controlled with medications as prescribed, with individualized exercise RX and with personalized nutrition plan. Value goals: LDL < , HDL > 40 mg.   Stress Yes   Intervention Offer individual and/or small group education and counseling on adjustment to heart disease, stress management and health-related lifestyle change. Teach and support self-help strategies.;Refer participants experiencing significant psychosocial distress to appropriate mental health specialists for further evaluation and treatment. When possible, include family members and significant others in education/counseling sessions.   Expected Outcomes Short Term: Participant demonstrates changes in health-related behavior, relaxation and other stress management skills, ability to obtain effective social support, and compliance with psychotropic medications if prescribed.;Long Term: Emotional wellbeing is indicated by absence of clinically significant psychosocial distress or social isolation.       Personal Goals Discharge:     Goals and Risk Factor Review    Row Name 10/26/16 0912             Core Components/Risk Factors/Patient Goals Review   Personal Goals Review Weight Management/Obesity;Improve shortness of breath with ADL's;Tobacco Cessation;Hypertension;Lipids;Diabetes       Review Theodosia has been not been able to lose much weight as she has not been able to exercise since her fall.   She still has refrained from smoking and makes her family members go outside to smoke!  She has been doing well with her blood pressures and blood sugars.  She routinely checks them at home.  She has been doing well with most of her medications (see note in pyschosocial for prozac not working).  She is interested in sticking with the program to continue to work on risk factor modifications.       Expected Outcomes Short: Continue to stay away from smoking. Long:  Continue to work on risk factor modification.          Exercise Goals and Review:     Exercise Goals    Row Name 09/19/16 1434             Exercise Goals   Increase Physical Activity Yes       Intervention Provide advice, education, support and counseling about physical activity/exercise  needs.;Develop an individualized exercise prescription for aerobic and resistive training based on initial evaluation findings, risk stratification, comorbidities and participant's personal goals.       Expected Outcomes Achievement of increased cardiorespiratory fitness and enhanced flexibility, muscular endurance and strength shown through measurements of functional capacity and personal statement of participant.       Increase Strength and Stamina Yes       Intervention Provide advice, education, support and counseling about physical activity/exercise needs.;Develop an individualized exercise prescription for aerobic and resistive training based on initial evaluation findings, risk stratification, comorbidities and participant's personal goals.       Expected Outcomes Achievement of increased cardiorespiratory fitness and enhanced flexibility, muscular endurance and strength shown through measurements of functional capacity and personal statement of participant.          Nutrition & Weight - Outcomes:     Pre Biometrics - 09/19/16 1434      Pre Biometrics   Height 5' (1.524 m)   Weight 241 lb 14.4 oz (109.7 kg)   Waist Circumference 45.5 inches   Hip Circumference 56 inches   Waist to Hip Ratio 0.81 %   BMI (Calculated) 47.3   Single Leg Stand 0.98 seconds       Nutrition:     Nutrition Therapy & Goals - 10/26/16 0923      Nutrition Therapy   RD appointment defered Yes      Nutrition Discharge:     Nutrition Assessments - 09/19/16 1218      MEDFICTS Scores   Pre Score 86      Education Questionnaire Score:     Knowledge Questionnaire Score - 09/21/16 0915       Knowledge Questionnaire Score   Pre Score 19/28  Reviewed results with patient today.        Goals reviewed with patient; copy given to patient.

## 2016-12-16 NOTE — Progress Notes (Signed)
Cardiac Individual Treatment Plan  Patient Details  Name: [name] MRN: [id number] Date of Birth: November 13, 1965 Referring Provider:     Cardiac Rehab from 09/19/2016 in [facility] Cardiac and Pulmonary Rehab  Referring Provider  Ward, Jeani Hawking MD      Initial Encounter Date:    Cardiac Rehab from 09/19/2016 in [facility] Cardiac and Pulmonary Rehab  Date  09/19/16  Referring Provider  Ward, Jeani Hawking MD      Visit Diagnosis: Aortic valve replaced  Patient's Home Medications on Admission:  Current Outpatient Prescriptions:  .  acetaminophen (TYLENOL) 500 MG tablet, Take 500 mg by mouth every 6 (six) hours as needed., Disp: , Rfl:  .  albuterol (PROVENTIL HFA;VENTOLIN HFA) 108 (90 Base) MCG/ACT inhaler, Inhale 1-2 puffs into the lungs every 6 (six) hours as needed for wheezing or shortness of breath., Disp: , Rfl:  .  amLODipine (NORVASC) 5 MG tablet, Take 5 mg by mouth daily., Disp: , Rfl:  .  aspirin 81 MG tablet, Take 81 mg by mouth daily., Disp: , Rfl:  .  budesonide-formoterol (SYMBICORT) 160-4.5 MCG/ACT inhaler, Inhale 2 puffs into the lungs 2 (two) times daily., Disp: , Rfl:  .  dicyclomine (BENTYL) 20 MG tablet, Take 1 tablet (20 mg total) by mouth 3 (three) times daily as needed for spasms. (Patient not taking: Reported on 04/21/2016), Disp: 15 tablet, Rfl: 0 .  famotidine (PEPCID) 40 MG tablet, Take 1 tablet (40 mg total) by mouth every evening., Disp: 30 tablet, Rfl: 1 .  FLUoxetine (PROZAC) 40 MG capsule, Take 40 mg by mouth 2 (two) times daily. , Disp: , Rfl:  .  furosemide (LASIX) 20 MG tablet, Take 20 mg by mouth daily as needed. , Disp: , Rfl:  .  HYDROcodone-acetaminophen (NORCO/VICODIN) 5-325 MG tablet, Take 1 tablet by mouth every 4 (four) hours as needed for moderate pain., Disp: , Rfl:  .  ibuprofen (ADVIL,MOTRIN) 600 MG tablet, Take 1 tablet (600 mg total) by mouth 3 (three) times daily. (Patient not taking: Reported on 09/19/2016), Disp: 30 tablet, Rfl: 0 .  lidocaine (LIDODERM) 5 %,  Place 1 patch onto the skin every 12 (twelve) hours. Remove & Discard patch within 12 hours or as directed by MD, Disp: 10 patch, Rfl: 0 .  metFORMIN (GLUCOPHAGE) 500 MG tablet, Take 500 mg by mouth 2 (two) times daily with a meal. , Disp: , Rfl:  .  methocarbamol (ROBAXIN) 500 MG tablet, Take 1 tablet (500 mg total) by mouth 2 (two) times daily., Disp: 20 tablet, Rfl: 0 .  metoprolol tartrate (LOPRESSOR) 25 MG tablet, Take 25 mg by mouth 2 (two) times daily., Disp: , Rfl:  .  omeprazole (PRILOSEC) 40 MG capsule, Take 40 mg by mouth daily., Disp: , Rfl:  .  Oxcarbazepine (TRILEPTAL) 300 MG tablet, Take 300 mg by mouth 2 (two) times daily., Disp: , Rfl:  .  potassium chloride SA (K-DUR,KLOR-CON) 20 MEQ tablet, Take 20 mEq by mouth daily. , Disp: , Rfl:  .  pravastatin (PRAVACHOL) 20 MG tablet, Take 20 mg by mouth at bedtime. , Disp: , Rfl:  .  ranitidine (ZANTAC) 300 MG tablet, Take 300 mg by mouth at bedtime., Disp: , Rfl:  .  sennosides-docusate sodium (SENOKOT-S) 8.6-50 MG tablet, Take 1 tablet by mouth at bedtime., Disp: , Rfl:  .  sucralfate (CARAFATE) 1 g tablet, Take 1 tablet (1 g total) by mouth 4 (four) times daily., Disp: 60 tablet, Rfl: 0 .  Vitamin D, Ergocalciferol, (DRISDOL)  50000 units CAPS capsule, Take 50,000 Units by mouth every 7 (seven) days., Disp: , Rfl:  .  warfarin (COUMADIN) 5 MG tablet, Take 5 mg by mouth daily. , Disp: , Rfl:   Past Medical History: Past Medical History:  Diagnosis Date  . CAD (coronary artery disease)    per cardiac MRI  . Diabetes mellitus without complication ([place])   . Hypertension   . Lupus (systemic lupus erythematosus) (HCC)     Tobacco Use: History  Smoking Status  . Former Smoker  . Packs/day: 0.50  . Years: 15.00  . Types: Cigarettes  . Quit date: 06/26/2016  Smokeless Tobacco  . Never Used    Labs: Recent Review Flowsheet Data    Labs for ITP Cardiac and Pulmonary Rehab Latest Ref Rng & Units 04/21/2016   Hemoglobin A1c 4.8 -  5.6 % 5.4       Exercise Target Goals:    Exercise Program Goal: Individual exercise prescription set with THRR, safety & activity barriers. Participant demonstrates ability to understand and report RPE using BORG scale, to self-measure pulse accurately, and to acknowledge the importance of the exercise prescription.  Exercise Prescription Goal: Starting with aerobic activity 30 plus minutes a day, 3 days per week for initial exercise prescription. Provide home exercise prescription and guidelines that participant acknowledges understanding prior to discharge.  Activity Barriers & Risk Stratification:     Activity Barriers & Cardiac Risk Stratification - 09/19/16 1217      Activity Barriers & Cardiac Risk Stratification   Activity Barriers Arthritis;Fibromyalgia;Shortness of Breath;Back Problems;Joint Problems;Deconditioning;Muscular Weakness;Balance Concerns;History of Falls  spinal stenosis, r knee meniscus tear, lupus, OA, RA   Cardiac Risk Stratification High      6 Minute Walk:     6 Minute Walk    Row Name 09/19/16 1425         6 Minute Walk   Phase Initial     Distance 1075 feet     Walk Time 6 minutes     # of Rest Breaks 0     MPH 2.04     METS 2.8     RPE 12     Perceived Dyspnea  1     VO2 Peak 9.82     Symptoms Yes (comment)     Comments knee pain 8/10 slightly SOB     Resting HR 73 bpm     Resting BP 140/70     Max Ex. HR 111 bpm     Max Ex. BP 164/74     2 Minute Post BP 126/72        Oxygen Initial Assessment:   Oxygen Re-Evaluation:   Oxygen Discharge (Final Oxygen Re-Evaluation):   Initial Exercise Prescription:     Initial Exercise Prescription - 09/19/16 1400      Date of Initial Exercise RX and Referring Provider   Date 09/19/16   Referring Provider Ward, Jeani Hawking MD     Treadmill   MPH 1.7   Grade 0.5   Minutes 15   METs 2.42     NuStep   Level 2   SPM 80   Minutes 15   METs 2.5     Biostep-RELP   Level 2   SPM 50    Minutes 15   METs 2     Prescription Details   Frequency (times per week) 3   Duration Progress to 45 minutes of aerobic exercise without signs/symptoms of physical distress     Intensity  THRR 40-80% of Max Heartrate 112-151   Ratings of Perceived Exertion 11-15   Perceived Dyspnea 0-4     Progression   Progression Continue to progress workloads to maintain intensity without signs/symptoms of physical distress.     Resistance Training   Training Prescription Yes   Weight 3 lbs   Reps 10-15      Perform Capillary Blood Glucose checks as needed.  Exercise Prescription Changes:     Exercise Prescription Changes    Row Name 09/19/16 1400 10/04/16 1500 10/20/16 1100 11/04/16 0900 11/15/16 1500     Response to Exercise   Blood Pressure (Admit) 140/70 122/82 132/77 126/64 130/74   Blood Pressure (Exercise) 144/74 138/82 124/70 124/64 164/82   Blood Pressure (Exit) 126/72 142/80 122/70 124/70 140/80   Heart Rate (Admit) 73 bpm 80 bpm 76 bpm 76 bpm 67 bpm   Heart Rate (Exercise) 111 bpm 105 bpm 110 bpm 101 bpm 107 bpm   Heart Rate (Exit) 78 bpm 79 bpm 77 bpm 72 bpm 75 bpm   Oxygen Saturation (Admit) 100 %  -  -  -  -   Oxygen Saturation (Exercise) 100 %  -  -  -  -   Rating of Perceived Exertion (Exercise) 12  - 13 14 12    Perceived Dyspnea (Exercise) 1  -  -  -  -   Symptoms knee pain 8/10, slightly SOB none none none knee pain   Comments walk test results  -  -  -  -   Duration  - Progress to 45 minutes of aerobic exercise without signs/symptoms of physical distress Progress to 45 minutes of aerobic exercise without signs/symptoms of physical distress Continue with 45 min of aerobic exercise without signs/symptoms of physical distress. Continue with 45 min of aerobic exercise without signs/symptoms of physical distress.   Intensity  - THRR unchanged THRR unchanged THRR unchanged THRR unchanged     Progression   Progression  - Continue to progress workloads to maintain  intensity without signs/symptoms of physical distress. Continue to progress workloads to maintain intensity without signs/symptoms of physical distress. Continue to progress workloads to maintain intensity without signs/symptoms of physical distress. Continue to progress workloads to maintain intensity without signs/symptoms of physical distress.   Average METs  - 2.27 2.94 2.69 3.14     Resistance Training   Training Prescription  - Yes Yes Yes Yes   Weight  - 3 lb 3 lb 4 lbs 4 lbs   Reps  - 10-15 10-15 10-15 10-15     Interval Training   Interval Training  - No No No No     Treadmill   MPH  - 1.7 1.7 1.8 2.9   Grade  - 0.5 0.5 2 2.5   Minutes  - 15 15 15 15    METs  - 2.54 2.54 2.87 4.22     NuStep   Level  -  - 5 5 5    Minutes  -  - 15 15 15    METs  -  - 3.4 3.2 3.2     Biostep-RELP   Level  - 3 3 3 6    SPM  - 50  -  -  -   Minutes  - 15 15 15 15    METs  - 2 3 2 2      Home Exercise Plan   Plans to continue exercise at  -  - Home (comment)  walking and going to water aerobics Home (comment)  walking and going to water aerobics Home (comment)  walking and going to water aerobics   Frequency  -  - Add 2 additional days to program exercise sessions. Add 2 additional days to program exercise sessions. Add 2 additional days to program exercise sessions.   Initial Home Exercises Provided  -  - 10/07/16 10/07/16 10/07/16      Exercise Comments:     Exercise Comments    Row Name 09/21/16 0901           Exercise Comments  First full day of exercise!  Patient was oriented to gym and equipment including functions, settings, policies, and procedures.  Patient's individual exercise prescription and treatment plan were reviewed.  All starting workloads were established based on the results of the 6 minute walk test done at initial orientation visit.  The plan for exercise progression was also introduced and progression will be customized based on patient's performance and goals.           Exercise Goals and Review:     Exercise Goals    Row Name 09/19/16 1434             Exercise Goals   Increase Physical Activity Yes       Intervention Provide advice, education, support and counseling about physical activity/exercise needs.;Develop an individualized exercise prescription for aerobic and resistive training based on initial evaluation findings, risk stratification, comorbidities and participant's personal goals.       Expected Outcomes Achievement of increased cardiorespiratory fitness and enhanced flexibility, muscular endurance and strength shown through measurements of functional capacity and personal statement of participant.       Increase Strength and Stamina Yes       Intervention Provide advice, education, support and counseling about physical activity/exercise needs.;Develop an individualized exercise prescription for aerobic and resistive training based on initial evaluation findings, risk stratification, comorbidities and participant's personal goals.       Expected Outcomes Achievement of increased cardiorespiratory fitness and enhanced flexibility, muscular endurance and strength shown through measurements of functional capacity and personal statement of participant.          Exercise Goals Re-Evaluation :     Exercise Goals Re-Evaluation    Row Name 10/04/16 1544 10/20/16 1137 10/26/16 0903 11/04/16 0913 11/15/16 1513     Exercise Goal Re-Evaluation   Exercise Goals Review Increase Physical Activity;Increase Strenth and Stamina Increase Physical Activity;Increase Strenth and Stamina Increase Physical Activity;Increase Strenth and Stamina Increase Physical Activity;Increase Strenth and Stamina Increase Physical Activity;Increase Strenth and Stamina   Comments [name] has tolerated exercise well on her first sessions. [name] has been out with various things including a fall last week.  Her last session was 7/16.  We will try to pick her back up and make  progress once she is cleared to return. Since her fall, [name] has not been able to walk as much.  Her knee often feels that it is going to give away.  She did go out to buy a cane to help support her knee.  She has been using 3lbs weights at home to maintain strength.  However, [name] has been able to tell how much she missed while she was out for her fall.  She is glad to be back in rehab.  She does have some trouble with her neck and we talked about looking into massage therapy now and physical therapy after she graduates. Geni has been doing well in rehab.  She is getting back into the  swing of things since her fall. She is still having some difficulty with knee pain still.  We will continue to monitor her progression. Machele continues to do well in rehab despite her knee pain.  She did ask for an ice pack last time she was here while she was exercing.  She has returned to the treadmill and started to increase her speed again.  We will continue to monitor her progression.    Expected Outcomes Short - attend class on a regular basis Long - Improve functional fitness level Short: Return to class regularly.  Long: Continue to increase strength and stamina.  Short:  Look into massage therapy for her neck.  Long: Continue to work on increasing phsycial activity at home. Short: Continue to build back up workloads.  Long: Increase activity at home.  Short: Continue to work on building back up.  Long: Increase acitivity at home.    Row Name 11/30/16 1530             Exercise Goal Re-Evaluation   Comments Out since last review          Discharge Exercise Prescription (Final Exercise Prescription Changes):     Exercise Prescription Changes - 11/15/16 1500      Response to Exercise   Blood Pressure (Admit) 130/74   Blood Pressure (Exercise) 164/82   Blood Pressure (Exit) 140/80   Heart Rate (Admit) 67 bpm   Heart Rate (Exercise) 107 bpm   Heart Rate (Exit) 75 bpm   Rating of Perceived Exertion (Exercise)  12   Symptoms knee pain   Duration Continue with 45 min of aerobic exercise without signs/symptoms of physical distress.   Intensity THRR unchanged     Progression   Progression Continue to progress workloads to maintain intensity without signs/symptoms of physical distress.   Average METs 3.14     Resistance Training   Training Prescription Yes   Weight 4 lbs   Reps 10-15     Interval Training   Interval Training No     Treadmill   MPH 2.9   Grade 2.5   Minutes 15   METs 4.22     NuStep   Level 5   Minutes 15   METs 3.2     Biostep-RELP   Level 6   Minutes 15   METs 2     Home Exercise Plan   Plans to continue exercise at Home (comment)  walking and going to water aerobics   Frequency Add 2 additional days to program exercise sessions.   Initial Home Exercises Provided 10/07/16      Nutrition:  Target Goals: Understanding of nutrition guidelines, daily intake of sodium <1521m, cholesterol <2069m calories 30% from fat and 7% or less from saturated fats, daily to have 5 or more servings of fruits and vegetables.  Biometrics:     Pre Biometrics - 09/19/16 1434      Pre Biometrics   Height 5' (1.524 m)   Weight 241 lb 14.4 oz (109.7 kg)   Waist Circumference 45.5 inches   Hip Circumference 56 inches   Waist to Hip Ratio 0.81 %   BMI (Calculated) 47.3   Single Leg Stand 0.98 seconds       Nutrition Therapy Plan and Nutrition Goals:     Nutrition Therapy & Goals - 10/26/16 0923      Nutrition Therapy   RD appointment defered Yes      Nutrition Discharge: Rate Your Plate Scores:  Nutrition Assessments - 09/19/16 1218      MEDFICTS Scores   Pre Score 86      Nutrition Goals Re-Evaluation:     Nutrition Goals Re-Evaluation    Row Name 10/26/16 8144             Goals   Comment [name] continues to decline a nutrition appointment.  She states that she knows what to eat and how to stick with a diet.  She admits to cheating her diet  twice a week with tacos from her boyfriend's food truck.  She knows that she is retaining some fluid and is trying to stay away from salt and read labels to help.       Expected Outcome Short: Continue to read labels and watch salt.  Long: Continue with heart healthy diet.           Nutrition Goals Discharge (Final Nutrition Goals Re-Evaluation):     Nutrition Goals Re-Evaluation - 10/26/16 0923      Goals   Comment [name] continues to decline a nutrition appointment.  She states that she knows what to eat and how to stick with a diet.  She admits to cheating her diet twice a week with tacos from her boyfriend's food truck.  She knows that she is retaining some fluid and is trying to stay away from salt and read labels to help.   Expected Outcome Short: Continue to read labels and watch salt.  Long: Continue with heart healthy diet.       Psychosocial: Target Goals: Acknowledge presence or absence of significant depression and/or stress, maximize coping skills, provide positive support system. Participant is able to verbalize types and ability to use techniques and skills needed for reducing stress and depression.   Initial Review & Psychosocial Screening:     Initial Psych Review & Screening - 09/19/16 1246      Initial Review   Comments [name] reports that she got down to 200lbs but when all this happened she "got depressed and just ate up to 240lbs now. [name] reports that she is suppose to get a genetic test since many anti-depressants are on the list that possibly her body doesnt' break down correctly. We discussed that even thought she needs knee surgery after her torn menisus that exercise usually helps with depression. [name] states that she doesn't want to kill herself but she "wants to buy a train ticket and leave far away but tthey will find me. " [name] reports that her Mother lives with her since her Mother is a Retired Marine scientist. She also lives with her boyfriend and his daughter and his  daughter's 2 children. Plus there are other children there 4 days away who they babysit.       Quality of Life Scores:      Quality of Life - 09/19/16 1311      Quality of Life Scores   Health/Function Pre 3.8 %   Socioeconomic Pre 5.56 %   Psych/Spiritual Pre 5.36 %   Family Pre 2.4 %   GLOBAL Pre 4.31 %      PHQ-9: Recent Review Flowsheet Data    Depression screen [facility] 2/9 09/19/2016   Decreased Interest 3   Down, Depressed, Hopeless 2   PHQ - 2 Score 5   Altered sleeping 3   Tired, decreased energy 3   Change in appetite 3   Feeling bad or failure about yourself  2   Trouble concentrating 2   Moving slowly  or fidgety/restless 2   Suicidal thoughts 0   PHQ-9 Score 20   Difficult doing work/chores Very difficult     Interpretation of Total Score  Total Score Depression Severity:  1-4 = Minimal depression, 5-9 = Mild depression, 10-14 = Moderate depression, 15-19 = Moderately severe depression, 20-27 = Severe depression   Psychosocial Evaluation and Intervention:     Psychosocial Evaluation - 10/03/16 1000      Psychosocial Evaluation & Interventions   Interventions Encouraged to exercise with the program and follow exercise prescription;Stress management education;Therapist referral;Relaxation education   Comments Counselor met with Ms. [name]) today for initial psychosocial evaluation.  She is a 51 year old who had an aortic valve replacement several months ago.  [name] also has multiple health issues with diabetes; Lupus; a miniscus tear in her right knee on top of mental health issues.  [name] has a strong support system with a significant other of (2) years whom she lives with and her mother as well.  She has other relatives who live locally.  [name] reports having sleep difficulties with approximately 3-4 hours of 'good sleep.  Her appetite is up and down - and varies with her mood.  [name] reports a history of depression and anxiety and is on 80 mg of Prozac since  last Fall - but states it is "not working."  She has tried other medications for her mood but is very sensitive to medications and none have been extremely helpful.  She has multiple stressors with her health; conflict in the family with her mother and Significant other's daughter and (2) small children who live with [name].  She has a PHQ-9 score of 20 which indicates severe depression.  She denies homicidal or suicidal ideations.   She reports she is not being followed with a psychiatrist currently and counselor gave her a recommendation to try to see if her insurance will cover.  Counselor recommended consistent exercise to see if this helps with mood and sleep somewhat.; and to see the psychiatrist.  Counselor will also recommend a therapist for client locally to help with mood; thoughts and improved coping strategies.  Counselor will be following with [name] throughout the course of this program.     Expected Outcomes Alayah will benefit from consistent exercise to achieve her stated goals of "getting my life back."  Also this will help improve her mood and possible sleep issues.  Counselor recommended Earnie sees a psychiatrist to address her medications not being helpful at this time.  Counselor will provide stress management and depression education for the class over the next few months to help Nohely learn to develop positive coping strategies.  Counselor will follow.     Continue Psychosocial Services  Follow up required by counselor      Psychosocial Re-Evaluation:     Psychosocial Re-Evaluation    [place] Name 09/26/16 [phone] 10/26/16 0921           Psychosocial Re-Evaluation   Comments [name] left a vm on the Cardiac Rehab phone vm that she would not be able to attend today since she is sick. Counselor follow up with [name] today.  She reports having fallen down her cement steps several weeks ago and had to be taken to the hospital.  She has some noticeable bruising and some ongoing back pain, but was able  to come back to class today.  [name] continues to struggle with anxiety and is on the schedule to see a psychiatrist on 8/16.  Her PCP is weaning her off the Prozac since it "is not working."  She is more irritable lately but attributes that to not smoking more than decreased medication.  [name] reports she is more relaxed in her body and mind when she comes to class or exercises and will walk on her off days to continue this.  She is coping by eating more; isolating and walking currently.  Counselor discussed positive self care with [name] - trying deep breathing and mindfulness strategies or visualization when she is feeling anxious or stressed.  Counselor commended her for continuing to exercise - even on her "off days" from this class - and for not smoking during this stressful time.  Counselor will follow with her after she sees the psychiatrist and will help her find a counselor if recommended by Dr.        Noberto Retort Outcomes  - [name] will continue to exercise consistently for her overall health.  She will see the psychiatrist for her mental health mood needs.  She will practice positive self care in the meantime.      Interventions  - Stress management education;Relaxation education      Continue Psychosocial Services   - Follow up required by counselor         Psychosocial Discharge (Final Psychosocial Re-Evaluation):     Psychosocial Re-Evaluation - 10/26/16 0921      Psychosocial Re-Evaluation   Comments Counselor follow up with [name] today.  She reports having fallen down her cement steps several weeks ago and had to be taken to the hospital.  She has some noticeable bruising and some ongoing back pain, but was able to come back to class today.  [name] continues to struggle with anxiety and is on the schedule to see a psychiatrist on 8/16.  Her PCP is weaning her off the Prozac since it "is not working."  She is more irritable lately but attributes that to not smoking more than decreased medication.   [name] reports she is more relaxed in her body and mind when she comes to class or exercises and will walk on her off days to continue this.  She is coping by eating more; isolating and walking currently.  Counselor discussed positive self care with [name] - trying deep breathing and mindfulness strategies or visualization when she is feeling anxious or stressed.  Counselor commended her for continuing to exercise - even on her "off days" from this class - and for not smoking during this stressful time.  Counselor will follow with her after she sees the psychiatrist and will help her find a counselor if recommended by Dr.     Noberto [employer] Pegeen will continue to exercise consistently for her overall health.  She will see the psychiatrist for her mental health mood needs.  She will practice positive self care in the meantime.   Interventions Stress management education;Relaxation education   Continue Psychosocial Services  Follow up required by counselor      Vocational Rehabilitation: Provide vocational rehab assistance to qualifying candidates.   Vocational Rehab Evaluation & Intervention:     Vocational Rehab - 09/19/16 1241      Initial Vocational Rehab Evaluation & Intervention   Assessment shows need for Vocational Rehabilitation Yes      Education: Education Goals: Education classes will be provided on a variety of topics geared toward better understanding of heart health and risk factor modification. Participant will state understanding/return demonstration of topics presented as noted by education test scores.  Learning Barriers/Preferences:     Learning Barriers/Preferences - 09/19/16 1240      Learning Barriers/Preferences   Learning Barriers None   Learning Preferences None      Education Topics: General Nutrition Guidelines/Fats and Fiber: -Group instruction provided by verbal, written material, models and posters to present the general guidelines for heart healthy  nutrition. Gives an explanation and review of dietary fats and fiber.   Controlling Sodium/Reading Food Labels: -Group verbal and written material supporting the discussion of sodium use in heart healthy nutrition. Review and explanation with models, verbal and written materials for utilization of the food label.   Exercise Physiology & Risk Factors: - Group verbal and written instruction with models to review the exercise physiology of the cardiovascular system and associated critical values. Details cardiovascular disease risk factors and the goals associated with each risk factor.   Cardiac Rehab from 11/07/2016 in [facility] Cardiac and Pulmonary Rehab  Date  10/03/16  Educator  [facility]  Instruction Review Code (retired)  2- meets goals/outcomes      Aerobic Exercise & Resistance Training: - Gives group verbal and written discussion on the health impact of inactivity. On the components of aerobic and resistive training programs and the benefits of this training and how to safely progress through these programs.   Flexibility, Balance, General Exercise Guidelines: - Provides group verbal and written instruction on the benefits of flexibility and balance training programs. Provides general exercise guidelines with specific guidelines to those with heart or lung disease. Demonstration and skill practice provided.   Cardiac Rehab from 11/07/2016 in [facility] Cardiac and Pulmonary Rehab  Date  10/10/16  Educator  [facility]  Instruction Review Code (retired)  2- meets goals/outcomes      Stress Management: - Provides group verbal and written instruction about the health risks of elevated stress, cause of high stress, and healthy ways to reduce stress.   Depression: - Provides group verbal and written instruction on the correlation between heart/lung disease and depressed mood, treatment options, and the stigmas associated with seeking treatment.   Cardiac Rehab from 11/07/2016 in [facility] Cardiac and Pulmonary  Rehab  Date  09/21/16  Educator  [facility]  Instruction Review Code (retired)  2- meets Designer, fashion/clothing & Physiology of the Heart: - Group verbal and written instruction and models provide basic cardiac anatomy and physiology, with the coronary electrical and arterial systems. Review of: AMI, Angina, Valve disease, Heart Failure, Cardiac Arrhythmia, Pacemakers, and the ICD.   Cardiac Procedures: - Group verbal and written instruction to review commonly prescribed medications for heart disease. Reviews the medication, class of the drug, and side effects. Includes the steps to properly store meds and maintain the prescription regimen. (beta blockers and nitrates)   Cardiac Rehab from 11/07/2016 in [facility] Cardiac and Pulmonary Rehab  Date  10/24/16  Educator  CE  Instruction Review Code (retired)  2- meets goals/outcomes      Cardiac Medications I: - Group verbal and written instruction to review commonly prescribed medications for heart disease. Reviews the medication, class of the drug, and side effects. Includes the steps to properly store meds and maintain the prescription regimen.   Cardiac Rehab from 11/07/2016 in [facility] Cardiac and Pulmonary Rehab  Date  10/31/16 [10/31/16 Part 1 11/02/16 Part 2]  Educator  Ce  Instruction Review Code (retired)  2- meets goals/outcomes      Cardiac Medications II: -Group verbal and written instruction to review commonly prescribed medications for heart  disease. Reviews the medication, class of the drug, and side effects. (all other drug classes)    Go Sex-Intimacy & Heart Disease, Get SMART - Goal Setting: - Group verbal and written instruction through game format to discuss heart disease and the return to sexual intimacy. Provides group verbal and written material to discuss and apply goal setting through the application of the S.M.A.R.T. Method.   Cardiac Rehab from 11/07/2016 in [facility] Cardiac and Pulmonary Rehab  Date  10/24/16  Educator  CE   Instruction Review Code (retired)  2- meets goals/outcomes      Other Matters of the Heart: - Provides group verbal, written materials and models to describe Heart Failure, Angina, Valve Disease, Peripheral Artery Disease, and Diabetes in the realm of heart disease. Includes description of the disease process and treatment options available to the cardiac patient.   Exercise & Equipment Safety: - Individual verbal instruction and demonstration of equipment use and safety with use of the equipment.   Cardiac Rehab from 11/07/2016 in [facility] Cardiac and Pulmonary Rehab  Date  09/19/16  Educator  C. ENterkinRN  Instruction Review Code (retired)  1- partially meets, needs review/practice      Infection Prevention: - Provides verbal and written material to individual with discussion of infection control including proper hand washing and proper equipment cleaning during exercise session.   Cardiac Rehab from 11/07/2016 in [facility] Cardiac and Pulmonary Rehab  Date  09/19/16  Educator  C. Enterkin, R.N.  Instruction Review Code (retired)  2- meets [employer] Prevention: - Provides verbal and written material to individual with discussion of falls prevention and safety.   Cardiac Rehab from 11/07/2016 in [facility] Cardiac and Pulmonary Rehab  Date  09/19/16  Educator  C. [place]  Instruction Review Code (retired)  2- meets goals/outcomes      Diabetes: - Individual verbal and written instruction to review signs/symptoms of diabetes, desired ranges of glucose level fasting, after meals and with exercise. Acknowledge that pre and post exercise glucose checks will be done for 3 sessions at entry of program.   Cardiac Rehab from 11/07/2016 in [facility] Cardiac and Pulmonary Rehab  Date  09/19/16  Educator  C. Enterkin, RN  Instruction Review Code (retired)  1- partially meets, needs review/practice      Other: -Provides group and verbal instruction on various topics (see  comments)    Knowledge Questionnaire Score:     Knowledge Questionnaire Score - 09/21/16 0915      Knowledge Questionnaire Score   Pre Score 19/28  Reviewed results with patient today.        Core Components/Risk Factors/Patient Goals at Admission:     Personal Goals and Risk Factors at Admission - 09/19/16 1214      Core Components/Risk Factors/Patient Goals on Admission    Weight Management Yes;Obesity   Intervention Weight Management: Develop a combined nutrition and exercise program designed to reach desired caloric intake, while maintaining appropriate intake of nutrient and fiber, sodium and fats, and appropriate energy expenditure required for the weight goal.;Weight Management: Provide education and appropriate resources to help participant work on and attain dietary goals.;Weight Management/Obesity: Establish reasonable short term and long term weight goals.;Obesity: Provide education and appropriate resources to help participant work on and attain dietary goals.   Admit Weight 241 lb 14.4 oz (109.7 kg)   Goal Weight: Short Term 235 lb (106.6 kg)   Goal Weight: Long Term 200 lb (90.7 kg)   Expected Outcomes  Short Term: Continue to assess and modify interventions until short term weight is achieved;Weight Loss: Understanding of general recommendations for a balanced deficit meal plan, which promotes 1-2 lb weight loss per week and includes a negative energy balance of [phone] kcal/d;Understanding recommendations for meals to include 15-35% energy as protein, 25-35% energy from fat, 35-60% energy from carbohydrates, less than 22m of dietary cholesterol, 20-35 gm of total fiber daily;Long Term: Adherence to nutrition and physical activity/exercise program aimed toward attainment of established weight goal;Understanding of distribution of calorie intake throughout the day with the consumption of 4-5 meals/snacks   Tobacco Cessation Yes   Intervention Assist the participant in  steps to quit. Provide individualized education and counseling about committing to Tobacco Cessation, relapse prevention, and pharmacological support that can be provided by physician.   Expected Outcomes Long Term: Complete abstinence from all tobacco products for at least 12 months from quit date.   Improve shortness of breath with ADL's Yes   Intervention Provide education, individualized exercise plan and daily activity instruction to help decrease symptoms of SOB with activities of daily living.   Expected Outcomes Short Term: Achieves a reduction of symptoms when performing activities of daily living.   Diabetes Yes   Intervention Provide education about signs/symptoms and action to take for hypo/hyperglycemia.;Provide education about proper nutrition, including hydration, and aerobic/resistive exercise prescription along with prescribed medications to achieve blood glucose in normal ranges: Fasting glucose 65-99 mg/dL   Expected Outcomes Short Term: Participant verbalizes understanding of the signs/symptoms and immediate care of hyper/hypoglycemia, proper foot care and importance of medication, aerobic/resistive exercise and nutrition plan for blood glucose control.;Long Term: Attainment of HbA1C < 7%.   Hypertension Yes   Intervention Provide education on lifestyle modifcations including regular physical activity/exercise, weight management, moderate sodium restriction and increased consumption of fresh fruit, vegetables, and low fat dairy, alcohol moderation, and smoking cessation.;Monitor prescription use compliance.   Expected Outcomes Short Term: Continued assessment and intervention until BP is < 140/950mHG in hypertensive participants. < 130/8078mG in hypertensive participants with diabetes, heart failure or chronic kidney disease.;Long Term: Maintenance of blood pressure at goal levels.   Lipids Yes   Intervention Provide education and support for participant on nutrition &  aerobic/resistive exercise along with prescribed medications to achieve LDL <32m11mDL >40mg12mExpected Outcomes Short Term: Participant states understanding of desired cholesterol values and is compliant with medications prescribed. Participant is following exercise prescription and nutrition guidelines.;Long Term: Cholesterol controlled with medications as prescribed, with individualized exercise RX and with personalized nutrition plan. Value goals: LDL < 32mg,75m > 40 mg.   Stress Yes   Intervention Offer individual and/or small group education and counseling on adjustment to heart disease, stress management and health-related lifestyle change. Teach and support self-help strategies.;Refer participants experiencing significant psychosocial distress to appropriate mental health specialists for further evaluation and treatment. When possible, include family members and significant others in education/counseling sessions.   Expected Outcomes Short Term: Participant demonstrates changes in health-related behavior, relaxation and other stress management skills, ability to obtain effective social support, and compliance with psychotropic medications if prescribed.;Long Term: Emotional wellbeing is indicated by absence of clinically significant psychosocial distress or social isolation.      Core Components/Risk Factors/Patient Goals Review:      Goals and Risk Factor Review    Row Name 10/26/16 0912             Core Components/Risk Factors/Patient Goals Review   Personal Goals  Review Weight Management/Obesity;Improve shortness of breath with ADL's;Tobacco Cessation;Hypertension;Lipids;Diabetes       Review Darinda has been not been able to lose much weight as she has not been able to exercise since her fall.   She still has refrained from smoking and makes her family members go outside to smoke!  She has been doing well with her blood pressures and blood sugars.  She routinely checks them at home.   She has been doing well with most of her medications (see note in pyschosocial for prozac not working).  She is interested in sticking with the program to continue to work on risk factor modifications.       Expected Outcomes Short: Continue to stay away from smoking. Long: Continue to work on risk factor modification.          Core Components/Risk Factors/Patient Goals at Discharge (Final Review):      Goals and Risk Factor Review - 10/26/16 0912      Core Components/Risk Factors/Patient Goals Review   Personal Goals Review Weight Management/Obesity;Improve shortness of breath with ADL's;Tobacco Cessation;Hypertension;Lipids;Diabetes   Review Evanny has been not been able to lose much weight as she has not been able to exercise since her fall.   She still has refrained from smoking and makes her family members go outside to smoke!  She has been doing well with her blood pressures and blood sugars.  She routinely checks them at home.  She has been doing well with most of her medications (see note in pyschosocial for prozac not working).  She is interested in sticking with the program to continue to work on risk factor modifications.   Expected Outcomes Short: Continue to stay away from smoking. Long: Continue to work on risk factor modification.      ITP Comments:     ITP Comments    Row Name 09/19/16 1244 09/19/16 1311 09/20/16 1323 09/26/16 0926 10/07/16 0853   ITP Comments Feiga reports she has Lupus, Rheumatoid Arthritis, Fibryomyalgia. 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 Aricela reports that she got down to 200lbs but when all this happened she "got depressed and just ate up to 240lbs now. " Bryan reports that she has had diabetes for 5 years.  ITP was created during Medical Review after Cardiac Rehab informed consent was signed. DX  EPIC/CHL discharge summary I left Jauna a vm that she can get a free physical therapy screening for her knee problem. She can call [phone] or check in at the front desk.  Leighann left a vm on the Cardiac Rehab phone vm that she would not be able to attend today since she is sick. Blimy is wearing a 30 day holter monitor.  SHe has had a few medication changes   Row Name 10/12/16 0631 10/12/16 1600 11/09/16 0608 11/18/16 1406 11/30/16 1529   ITP Comments 30 day review. Continue with ITP unless directed changes per Medical Director review   New to program Johannah called to let us know that she has been out because she fell down concrete steps at home.  She was evaluated by the ER.  She also has a follow up appointment on Friday and will be out of class.  30 day review. Continue with ITP unless directed changes per Medical Director review  Called to check on status of  return.  She fell again at home and knee swelled up again.  She is covered in bruises.  She hopes to return next week.  She wants to stay off the treadmill for a week or so.  Called to check on status of return.  Left message.   Out since 11/07/16   Row Name 12/07/16 1100 12/16/16 1052         ITP Comments 30 day review. Continue with ITP unless directed changes per Medical Director review.   Called to check on status of return. Christl is starting to feel better.  She has left message with cardiology.  Her knee continues to get worse and needs a total knee replacement.  She starts that on Tuesday.  She will have to discharge at this time.           Comments: Discharge ITP

## 2017-02-24 ENCOUNTER — Ambulatory Visit: Payer: Self-pay | Admitting: *Deleted

## 2017-05-01 ENCOUNTER — Other Ambulatory Visit: Payer: Self-pay

## 2017-05-01 ENCOUNTER — Emergency Department: Payer: Managed Care, Other (non HMO)

## 2017-05-01 ENCOUNTER — Encounter: Payer: Self-pay | Admitting: Emergency Medicine

## 2017-05-01 ENCOUNTER — Emergency Department
Admission: EM | Admit: 2017-05-01 | Discharge: 2017-05-01 | Disposition: A | Discharge status: Home / Self Care | Payer: Managed Care, Other (non HMO) | Attending: Emergency Medicine | Admitting: Emergency Medicine

## 2017-05-01 DIAGNOSIS — E119 Type 2 diabetes mellitus without complications: Secondary | ICD-10-CM | POA: Insufficient documentation

## 2017-05-01 DIAGNOSIS — Z7982 Long term (current) use of aspirin: Secondary | ICD-10-CM | POA: Diagnosis not present

## 2017-05-01 DIAGNOSIS — Z79899 Other long term (current) drug therapy: Secondary | ICD-10-CM | POA: Insufficient documentation

## 2017-05-01 DIAGNOSIS — F419 Anxiety disorder, unspecified: Secondary | ICD-10-CM | POA: Diagnosis not present

## 2017-05-01 DIAGNOSIS — Z952 Presence of prosthetic heart valve: Secondary | ICD-10-CM | POA: Insufficient documentation

## 2017-05-01 DIAGNOSIS — I2510 Atherosclerotic heart disease of native coronary artery without angina pectoris: Secondary | ICD-10-CM | POA: Insufficient documentation

## 2017-05-01 DIAGNOSIS — R079 Chest pain, unspecified: Secondary | ICD-10-CM | POA: Diagnosis present

## 2017-05-01 DIAGNOSIS — J449 Chronic obstructive pulmonary disease, unspecified: Secondary | ICD-10-CM | POA: Diagnosis not present

## 2017-05-01 DIAGNOSIS — Z7901 Long term (current) use of anticoagulants: Secondary | ICD-10-CM | POA: Insufficient documentation

## 2017-05-01 DIAGNOSIS — Z87891 Personal history of nicotine dependence: Secondary | ICD-10-CM | POA: Insufficient documentation

## 2017-05-01 DIAGNOSIS — I10 Essential (primary) hypertension: Secondary | ICD-10-CM | POA: Insufficient documentation

## 2017-05-01 DIAGNOSIS — Z7984 Long term (current) use of oral hypoglycemic drugs: Secondary | ICD-10-CM | POA: Diagnosis not present

## 2017-05-01 LAB — COMPREHENSIVE METABOLIC PANEL
ALBUMIN: 3.6 g/dL (ref 3.5–5.0)
ALT: 20 U/L (ref 14–54)
AST: 27 U/L (ref 15–41)
Alkaline Phosphatase: 66 U/L (ref 38–126)
Anion gap: 7 (ref 5–15)
BILIRUBIN TOTAL: 0.5 mg/dL (ref 0.3–1.2)
BUN: 12 mg/dL (ref 6–20)
CHLORIDE: 103 mmol/L (ref 101–111)
CO2: 26 mmol/L (ref 22–32)
CREATININE: 0.84 mg/dL (ref 0.44–1.00)
Calcium: 8.8 mg/dL — ABNORMAL LOW (ref 8.9–10.3)
GFR calc Af Amer: >60 mL/min (ref >60)
GLUCOSE: 131 mg/dL — AB (ref 65–99)
Potassium: 4.3 mmol/L (ref 3.5–5.1)
Sodium: 136 mmol/L (ref 135–145)
TOTAL PROTEIN: 7.1 g/dL (ref 6.5–8.1)

## 2017-05-01 LAB — CBC
HCT: 33.9 % — ABNORMAL LOW (ref 35.0–47.0)
HEMOGLOBIN: 10.3 g/dL — AB (ref 12.0–16.0)
MCH: 20.7 pg — AB (ref 26.0–34.0)
MCHC: 30.4 g/dL — ABNORMAL LOW (ref 32.0–36.0)
MCV: 67.9 fL — AB (ref 80.0–100.0)
Platelets: 230 10*3/uL (ref 150–440)
RBC: 4.99 MIL/uL (ref 3.80–5.20)
RDW: 19.9 % — ABNORMAL HIGH (ref 11.5–14.5)
WBC: 5.9 10*3/uL (ref 3.6–11.0)

## 2017-05-01 LAB — TROPONIN I
Troponin I: <0.03 ng/mL (ref <0.03)
Troponin I: <0.03 ng/mL (ref <0.03)

## 2017-05-01 LAB — MAGNESIUM: Magnesium: 2 mg/dL (ref 1.7–2.4)

## 2017-05-01 MED ORDER — GI COCKTAIL ~~LOC~~
30.0000 mL | Freq: Once | ORAL | Status: AC
  Administered 2017-05-01: 30 mL via ORAL
  Filled 2017-05-01: qty 30

## 2017-05-01 NOTE — Discharge Instructions (Signed)
Please seek medical attention for any high fevers, chest pain, shortness of breath, change in behavior, persistent vomiting, bloody stool or any other new or concerning symptoms.  

## 2017-05-01 NOTE — ED Triage Notes (Signed)
Chest pain radiating up neck and to L arm x 2 days.

## 2017-05-01 NOTE — ED Provider Notes (Signed)
[facility] Emergency Department Provider Note  ____________________________________________   I have reviewed the triage vital signs and the nursing notes.   HISTORY  Chief Complaint Chest Pain   History limited by: Not Limited   HPI [name] is a 52 y.o. female who presents to the emergency department today with primary concern for chest pain. Located in central and left upper chest. Has been present for the past 5 days. It has been constant. It is severe. Describes it as tight in quality. The patient has had associated "charley horses" of her left neck and left upper arm. Has also had these occur in her lower extremities.  She has not noticed any pattern or eliciting or relieving factors for the chest discomfort or charley horses.  Patient states she underwent valve replacement surgery last April and has never had pain like this since then.  She denies any fevers.  Per medical record review patient has a history of aortic valve replacement, CAD, DM, HTN, lupus.   Past Medical History:  Diagnosis Date  . CAD (coronary artery disease)    per cardiac MRI  . Diabetes mellitus without complication (HCC)   . Hypertension   . Lupus (systemic lupus erythematosus) (HCC)     Patient Active Problem List   Diagnosis Date Noted  . 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 Atrial fibrillation (HCC) 07/02/2016  . Anxiety, generalized 06/27/2016  . Chest pain 04/21/2016  . 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 ASHD (arteriosclerotic heart disease) 11/02/2011  . Chronic back pain 11/02/2011    Past Surgical History:  Procedure Laterality Date  . APPENDECTOMY    . CESAREAN SECTION    . KNEE ARTHROSCOPY    . TONSILLECTOMY    . VALVE REPLACEMENT      Prior to Admission medications   Medication Sig Start Date End Date Taking? Authorizing Provider  acetaminophen (TYLENOL) 500 MG tablet Take 500 mg by  mouth every 6 (six) hours as needed.    [provider]  albuterol (PROVENTIL HFA;VENTOLIN HFA) 108 (90 Base) MCG/ACT inhaler Inhale 1-2 puffs into the lungs every 6 (six) hours as needed for wheezing or shortness of breath.    [provider]  amLODipine (NORVASC) 5 MG tablet Take 5 mg by mouth daily.    [provider]  aspirin 81 MG tablet Take 81 mg by mouth daily.    [provider]  budesonide-formoterol (SYMBICORT) 160-4.5 MCG/ACT inhaler Inhale 2 puffs into the lungs 2 (two) times daily. 01/14/16 01/13/17  [provider]  dicyclomine (BENTYL) 20 MG tablet Take 1 tablet (20 mg total) by mouth 3 (three) times daily as needed for spasms. Patient not taking: Reported on 04/21/2016 05/14/15 05/13/16  Myrna BlazerSchaevitz, David Matthew, MD  famotidine (PEPCID) 40 MG tablet Take 1 tablet (40 mg total) by mouth every evening. 12/13/16 12/13/17  Phineas SemenGoodman, Quintina Hakeem, MD  FLUoxetine (PROZAC) 40 MG capsule Take 40 mg by mouth 2 (two) times daily.     [provider]  furosemide (LASIX) 20 MG tablet Take 20 mg by mouth daily as needed.  07/18/16 07/18/17  [provider]  HYDROcodone-acetaminophen (NORCO/VICODIN) 5-325 MG tablet Take 1 tablet by mouth every 4 (four) hours as needed for moderate pain.    [provider]  ibuprofen (ADVIL,MOTRIN) 600 MG tablet Take 1 tablet (600 mg total) by mouth 3 (three) times daily. Patient not taking: Reported on 09/19/2016 04/21/16  Adrian Saran, MD  lidocaine (LIDODERM) 5 % Place 1 patch onto the skin every 12 (twelve) hours. Remove & Discard patch within 12 hours or as directed by MD 10/17/16 10/17/17  Menshew, Charlesetta Ivory, PA-C  metFORMIN (GLUCOPHAGE) 500 MG tablet Take 500 mg by mouth 2 (two) times daily with a meal.     [provider]  methocarbamol (ROBAXIN) 500 MG tablet Take 1 tablet (500 mg total) by mouth 2 (two) times daily. 01/06/15   Payton Mccallum, MD  metoprolol tartrate (LOPRESSOR) 25 MG  tablet Take 25 mg by mouth 2 (two) times daily.    [provider]  omeprazole (PRILOSEC) 40 MG capsule Take 40 mg by mouth daily.    [provider]  Oxcarbazepine (TRILEPTAL) 300 MG tablet Take 300 mg by mouth 2 (two) times daily.    [provider]  potassium chloride SA (K-DUR,KLOR-CON) 20 MEQ tablet Take 20 mEq by mouth daily.     [provider]  pravastatin (PRAVACHOL) 20 MG tablet Take 20 mg by mouth at bedtime.     [provider]  ranitidine (ZANTAC) 300 MG tablet Take 300 mg by mouth at bedtime.    [provider]  sennosides-docusate sodium (SENOKOT-S) 8.6-50 MG tablet Take 1 tablet by mouth at bedtime.    [provider]  sucralfate (CARAFATE) 1 g tablet Take 1 tablet (1 g total) by mouth 4 (four) times daily. 12/13/16   Phineas Semen, MD  Vitamin D, Ergocalciferol, (DRISDOL) 50000 units CAPS capsule Take 50,000 Units by mouth every 7 (seven) days.    [provider]  warfarin (COUMADIN) 5 MG tablet Take 5 mg by mouth daily.     [provider]    Allergies Flexeril [cyclobenzaprine]; Celecoxib; Gabapentin; Iodinated diagnostic agents; Morphine and related; Percocet [oxycodone-acetaminophen]; Skelaxin [metaxalone]; Cataflam [diclofenac]; Demerol [meperidine]; Nortriptyline; Tramadol; Ketorolac; and Norflex [orphenadrine citrate]  Family History  Problem Relation Age of Onset  . Hyperlipidemia Mother   . Hypertension Father   . Diabetes Father   . Hyperlipidemia Father     Social History Social History   Tobacco Use  . Smoking status: Former Smoker    Packs/day: 0.50    Years: 15.00    Pack years: 7.50    Types: Cigarettes    Last attempt to quit: 06/26/2016    Years since quitting: 0.8  . Smokeless tobacco: Never Used  Substance Use Topics  . Alcohol use: No  . Drug use: No    Review of Systems Constitutional: No fever/chills Eyes: No visual changes. ENT: No sore  throat. Cardiovascular: Positive for chest pain. Respiratory: Denies shortness of breath. Gastrointestinal: No abdominal pain.  No nausea, no vomiting.  No diarrhea.   Genitourinary: Negative for dysuria. Musculoskeletal: Positive for charley horses.  Skin: Negative for rash. Neurological: Negative for headaches, focal weakness or numbness.  ____________________________________________   PHYSICAL EXAM:  VITAL SIGNS: ED Triage Vitals  Enc Vitals Group     BP 05/01/17 1124 (!) 147/80     Pulse Rate 05/01/17 1124 82     Resp 05/01/17 1124 18     Temp 05/01/17 1124 98.4 F (36.9 C)     Temp Source 05/01/17 1124 Oral     SpO2 05/01/17 1124 99 %     Weight 05/01/17 1125 260 lb (117.9 kg)     Height 05/01/17 1125 4\' 11"  (1.499 m)     Head Circumference --      Peak Flow --  Pain Score 05/01/17 1125 8   Constitutional: Alert and oriented. Well appearing and in no distress. Eyes: Conjunctivae are normal.  ENT   Head: Normocephalic and atraumatic.   Nose: No congestion/rhinnorhea.   Mouth/Throat: Mucous membranes are moist.   Neck: No stridor. Hematological/Lymphatic/Immunilogical: No cervical lymphadenopathy. Cardiovascular: Normal rate, regular rhythm.  Systolic murmur with artificial valve click.  Respiratory: Normal respiratory effort without tachypnea nor retractions. Breath sounds are clear and equal bilaterally. No wheezes/rales/rhonchi. Gastrointestinal: Soft and non tender. No rebound. No guarding.  Genitourinary: Deferred Musculoskeletal: Normal range of motion in all extremities. No lower extremity edema. Neurologic:  Normal speech and language. No gross focal neurologic deficits are appreciated.  Skin:  Skin is warm, dry and intact. No rash noted. Psychiatric: Mood and affect are normal. Speech and behavior are normal. Patient exhibits appropriate insight and judgment.  ____________________________________________    LABS (pertinent  positives/negatives)  Trop <0.03 CBC wbc 5.9, hgb 10.3, plt 230 CMP glu 131, ca 8.8 otherwise wnl  ____________________________________________   EKG  I, Phineas Semen, attending physician, personally viewed and interpreted this EKG  EKG Time: 1122 Rate: 85 Rhythm: normal sinus rhythm Axis: normal Intervals: qtc 492 QRS: LBBB ST changes: no st elevation Impression: abnormal ekg   ____________________________________________    RADIOLOGY  CXR  No acute disease  ____________________________________________   PROCEDURES  Procedures  ____________________________________________   INITIAL IMPRESSION / ASSESSMENT AND PLAN / ED COURSE  Pertinent labs & imaging results that were available during my care of the patient were reviewed by me and considered in my medical decision making (see chart for details).  Patient presented to the emergency department today because of concerns for chest pressure and charley horses.  Differential would be broad including pneumothorax, pneumonia, acute coronary syndrome, esophagitis, muscle skeletal inflammation amongst other etiologies.  Chest x-ray without any acute findings of pneumonia or pneumothorax.  Blood work without any elevation of white blood cell count, no anemia, no electrolyte abnormalities.  EKG without concerning findings.  Left bundle branch block is not new.  This point I doubt something like dissection or PE.  Think more likely muscle skeletal given charley horses.  Patient states she has follow-up with her cardiologist this week.  Discussed findings and plan with patient.    ____________________________________________   FINAL CLINICAL IMPRESSION(S) / ED DIAGNOSES  Final diagnoses:  Nonspecific chest pain     Note: This dictation was prepared with Dragon dictation. Any transcriptional errors that result from this process are unintentional     Phineas Semen, MD 05/01/17 1800

## 2017-06-03 ENCOUNTER — Ambulatory Visit
Admission: EM | Admit: 2017-06-03 | Discharge: 2017-06-03 | Disposition: A | Discharge status: Home / Self Care | Payer: Managed Care, Other (non HMO) | Attending: Family Medicine | Admitting: Family Medicine

## 2017-06-03 ENCOUNTER — Other Ambulatory Visit: Payer: Self-pay

## 2017-06-03 ENCOUNTER — Ambulatory Visit (INDEPENDENT_AMBULATORY_CARE_PROVIDER_SITE_OTHER): Payer: Managed Care, Other (non HMO)

## 2017-06-03 DIAGNOSIS — R05 Cough: Secondary | ICD-10-CM

## 2017-06-03 DIAGNOSIS — J441 Chronic obstructive pulmonary disease with (acute) exacerbation: Secondary | ICD-10-CM | POA: Diagnosis not present

## 2017-06-03 MED ORDER — PREDNISONE 50 MG PO TABS: ORAL_TABLET | ORAL | 0 refills | Status: DC

## 2017-06-03 MED ORDER — IPRATROPIUM-ALBUTEROL 0.5-2.5 (3) MG/3ML IN SOLN: 3.0000 mL | Freq: Once | RESPIRATORY_TRACT | Status: DC

## 2017-06-03 MED ORDER — DOXYCYCLINE HYCLATE 100 MG PO CAPS: 100.0000 mg | ORAL_CAPSULE | Freq: Two times a day (BID) | ORAL | 0 refills | Status: DC

## 2017-06-03 NOTE — ED Provider Notes (Signed)
[facility]    CSN: [id number] Arrival date & time: 06/03/17  0801  History   Chief Complaint Chief Complaint  Patient presents with  . Cough   HPI  52 year old female with a complicated past medical history including coronary artery disease, DM, lupus, atrial fibrillation, aortic valve replacement on chronic Coumadin, mild COPD presents with cough.  Patient states that she has been sick for the past 4 days.  She has had sinus pressure and pain, productive cough (green sputum), fever T-max 102.4, sore throat, and body aches.  She is also had chills.  She is taken over-the-counter Mucinex and Tylenol without significant improvement.  Patient states that she got her pneumococcal vaccine and flu vaccine this year.  She endorses shortness of breath with cough.  No known exacerbating factors.  No other associated symptoms.  No other complaints.  Past Medical History:  Diagnosis Date  . CAD (coronary artery disease)    per cardiac MRI  . Diabetes mellitus without complication (HCC)   . Hypertension   . Lupus (systemic lupus erythematosus) (HCC)     Patient Active Problem List   Diagnosis Date Noted  . 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 Atrial fibrillation (HCC) 07/02/2016  . Anxiety, generalized 06/27/2016  . Chest pain 04/21/2016  . 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 ASHD (arteriosclerotic heart disease) 11/02/2011  . Chronic back pain 11/02/2011    Past Surgical History:  Procedure Laterality Date  . APPENDECTOMY    . CESAREAN SECTION    . KNEE ARTHROSCOPY    . TONSILLECTOMY    . VALVE REPLACEMENT     OB History    No data available     Home Medications    Prior to Admission medications   Medication Sig Start Date End Date Taking? Authorizing Provider  acetaminophen (TYLENOL) 500 MG tablet Take 500 mg by mouth every 6 (six) hours as needed.   Yes [provider]  albuterol  (PROVENTIL HFA;VENTOLIN HFA) 108 (90 Base) MCG/ACT inhaler Inhale 1-2 puffs into the lungs every 6 (six) hours as needed for wheezing or shortness of breath.   Yes [provider]  amLODipine (NORVASC) 5 MG tablet Take 5 mg by mouth daily.   Yes [provider]  aspirin 81 MG tablet Take 81 mg by mouth daily.   Yes [provider]  budesonide-formoterol (SYMBICORT) 160-4.5 MCG/ACT inhaler Inhale 2 puffs into the lungs 2 (two) times daily. 01/14/16 06/03/17 Yes [provider]  FLUoxetine (PROZAC) 40 MG capsule Take 40 mg by mouth 2 (two) times daily.    Yes [provider]  furosemide (LASIX) 20 MG tablet Take 20 mg by mouth daily as needed.  07/18/16 07/18/17 Yes [provider]  HYDROcodone-acetaminophen (NORCO/VICODIN) 5-325 MG tablet Take 1 tablet by mouth every 4 (four) hours as needed for moderate pain.   Yes [provider]  lidocaine (LIDODERM) 5 % Place 1 patch onto the skin every 12 (twelve) hours. Remove & Discard patch within 12 hours or as directed by MD 10/17/16 10/17/17 Yes Menshew, Charlesetta IvoryJenise V Bacon, PA-C  metFORMIN (GLUCOPHAGE) 500 MG tablet Take 500 mg by mouth 2 (two) times daily with a meal.    Yes [provider]  methocarbamol (ROBAXIN) 500 MG tablet Take 1 tablet (500 mg total) by mouth 2 (two) times daily. 01/06/15  Yes Payton Mccallumonty, [place], MD  metoprolol tartrate (LOPRESSOR) 25 MG tablet Take 25 mg  by mouth 2 (two) times daily.   Yes [provider]  omeprazole (PRILOSEC) 40 MG capsule Take 40 mg by mouth daily.   Yes [provider]  potassium chloride SA (K-DUR,KLOR-CON) 20 MEQ tablet Take 20 mEq by mouth daily.    Yes [provider]  pravastatin (PRAVACHOL) 20 MG tablet Take 20 mg by mouth at bedtime.    Yes [provider]  sennosides-docusate sodium (SENOKOT-S) 8.6-50 MG tablet Take 1 tablet by mouth at bedtime.   Yes [provider]  warfarin (COUMADIN) 5 MG tablet  Take 5 mg by mouth daily.    Yes [provider]  doxycycline (VIBRAMYCIN) 100 MG capsule Take 1 capsule (100 mg total) by mouth 2 (two) times daily. 06/03/17   Tommie Sams, DO  famotidine (PEPCID) 40 MG tablet Take 1 tablet (40 mg total) by mouth every evening. 12/13/16 12/13/17  Phineas Semen, MD  Oxcarbazepine (TRILEPTAL) 300 MG tablet Take 300 mg by mouth 2 (two) times daily.    [provider]  predniSONE (DELTASONE) 50 MG tablet 50 mg daily for 5 days. 06/03/17   Tommie Sams, DO  ranitidine (ZANTAC) 300 MG tablet Take 300 mg by mouth at bedtime.    [provider]  sucralfate (CARAFATE) 1 g tablet Take 1 tablet (1 g total) by mouth 4 (four) times daily. 12/13/16   Phineas Semen, MD  Vitamin D, Ergocalciferol, (DRISDOL) 50000 units CAPS capsule Take 50,000 Units by mouth every 7 (seven) days.    [provider]    Family History Family History  Problem Relation Age of Onset  . Hyperlipidemia Mother   . Hypertension Father   . Diabetes Father   . Hyperlipidemia Father     Social History Social History   Tobacco Use  . Smoking status: Former Smoker    Packs/day: 0.50    Years: 15.00    Pack years: 7.50    Types: Cigarettes    Last attempt to quit: 06/26/2016    Years since quitting: 0.9  . Smokeless tobacco: Never Used  Substance Use Topics  . Alcohol use: No  . Drug use: No     Allergies   Flexeril [cyclobenzaprine]; Celecoxib; Gabapentin; Iodinated diagnostic agents; Morphine and related; Percocet [oxycodone-acetaminophen]; Skelaxin [metaxalone]; Cataflam [diclofenac]; Demerol [meperidine]; Nortriptyline; Tramadol; Ketorolac; and Norflex [orphenadrine citrate]   Review of Systems Review of Systems  Constitutional: Positive for chills and fever.  HENT: Positive for congestion and sinus pressure.   Respiratory: Positive for cough, chest tightness and shortness of breath.   Musculoskeletal:       Body aches.   Physical  Exam Triage Vital Signs ED Triage Vitals  Enc Vitals Group     BP 06/03/17 0816 (!) 142/75     Pulse Rate 06/03/17 0816 (!) 105     Resp --      Temp 06/03/17 0816 99.6 F (37.6 C)     Temp Source 06/03/17 0816 Oral     SpO2 06/03/17 0816 99 %     Weight 06/03/17 0818 250 lb (113.4 kg)     Height 06/03/17 0818 4\' 11"  (1.499 m)     Head Circumference --      Peak Flow --      Pain Score 06/03/17 0817 7     Pain Loc --      Pain Edu? --      Excl. in GC? --    Updated Vital Signs BP (!) 142/75 (BP  Location: Left Arm)   Pulse (!) 105   Temp 99.6 F (37.6 C) (Oral)   Ht 4\' 11"  (1.499 m)   Wt 250 lb (113.4 kg)   SpO2 99%   BMI 50.49 kg/m     Physical Exam  Constitutional: She is oriented to person, place, and time. She appears well-developed. No distress.  HENT:  Head: Normocephalic and atraumatic.  Mouth/Throat: Oropharynx is clear and moist.  Eyes: Conjunctivae are normal. Right eye exhibits no discharge. Left eye exhibits no discharge.  Cardiovascular: Regular rhythm.  Tachycardia.  Pulmonary/Chest: Effort normal.  Diffuse coarse breath sounds and wheezing.  Neurological: She is alert and oriented to person, place, and time.  Psychiatric: She has a normal mood and affect. Her behavior is normal.  Nursing note and vitals reviewed.  UC Treatments / Results  Labs (all labs ordered are listed, but only abnormal results are displayed) Labs Reviewed - No data to display  EKG  EKG Interpretation None       Radiology Dg Chest 2 View  Result Date: 06/03/2017 CLINICAL DATA:  Productive cough.  Fever. EXAM: CHEST - 2 VIEW COMPARISON:  May 01, 2017 FINDINGS: The heart size and mediastinal contours are within normal limits. Both lungs are clear. The visualized skeletal structures are unremarkable. IMPRESSION: No active cardiopulmonary disease. Electronically Signed   By: Gerome Sam III M.D   On: 06/03/2017 08:48    Procedures Procedures (including critical  care time)  Medications Ordered in UC Medications  ipratropium-albuterol (DUONEB) 0.5-2.5 (3) MG/3ML nebulizer solution 3 mL (not administered)     Initial Impression / Assessment and Plan / UC Course  I have reviewed the triage vital signs and the nursing notes.  Pertinent labs & imaging results that were available during my care of the patient were reviewed by me and considered in my medical decision making (see chart for details).     52 year old female presents with a COPD exacerbation.  Chest x-ray negative.  Treating with prednisone and doxycycline.  Final Clinical Impressions(s) / UC Diagnoses   Final diagnoses:  COPD exacerbation [facility])   ED Discharge Orders        Ordered    predniSONE (DELTASONE) 50 MG tablet     06/03/17 0904    doxycycline (VIBRAMYCIN) 100 MG capsule  2 times daily     06/03/17 0904     Controlled Substance Prescriptions [place] Controlled Substance Registry consulted? Not Applicable   Tommie Sams, DO 06/03/17 [id number]

## 2017-06-03 NOTE — Discharge Instructions (Signed)
Rest, fluids. ° °Medications as prescribed. ° °Take care ° °Dr. Jakob Kimberlin  °

## 2017-06-03 NOTE — ED Triage Notes (Signed)
Patient comes in today and c/o cough, sinus pressure, bilateral ear ache x 4 days. Patient states it hurts to breathe.

## 2017-06-28 ENCOUNTER — Other Ambulatory Visit: Payer: Self-pay

## 2017-06-28 ENCOUNTER — Emergency Department: Payer: Managed Care, Other (non HMO)

## 2017-06-28 ENCOUNTER — Observation Stay
Admission: EM | Admit: 2017-06-28 | Discharge: 2017-06-29 | Disposition: A | Discharge status: Home / Self Care | Payer: Managed Care, Other (non HMO) | Attending: Internal Medicine | Admitting: Internal Medicine

## 2017-06-28 ENCOUNTER — Encounter: Payer: Self-pay | Admitting: Emergency Medicine

## 2017-06-28 DIAGNOSIS — I4891 Unspecified atrial fibrillation: Secondary | ICD-10-CM | POA: Insufficient documentation

## 2017-06-28 DIAGNOSIS — E119 Type 2 diabetes mellitus without complications: Secondary | ICD-10-CM | POA: Insufficient documentation

## 2017-06-28 DIAGNOSIS — I371 Nonrheumatic pulmonary valve insufficiency: Secondary | ICD-10-CM | POA: Insufficient documentation

## 2017-06-28 DIAGNOSIS — A084 Viral intestinal infection, unspecified: Secondary | ICD-10-CM | POA: Diagnosis not present

## 2017-06-28 DIAGNOSIS — J449 Chronic obstructive pulmonary disease, unspecified: Secondary | ICD-10-CM | POA: Insufficient documentation

## 2017-06-28 DIAGNOSIS — Z885 Allergy status to narcotic agent status: Secondary | ICD-10-CM | POA: Insufficient documentation

## 2017-06-28 DIAGNOSIS — Z79899 Other long term (current) drug therapy: Secondary | ICD-10-CM | POA: Insufficient documentation

## 2017-06-28 DIAGNOSIS — I10 Essential (primary) hypertension: Secondary | ICD-10-CM | POA: Insufficient documentation

## 2017-06-28 DIAGNOSIS — Z888 Allergy status to other drugs, medicaments and biological substances status: Secondary | ICD-10-CM | POA: Diagnosis not present

## 2017-06-28 DIAGNOSIS — Z7901 Long term (current) use of anticoagulants: Secondary | ICD-10-CM | POA: Insufficient documentation

## 2017-06-28 DIAGNOSIS — R079 Chest pain, unspecified: Secondary | ICD-10-CM | POA: Diagnosis present

## 2017-06-28 DIAGNOSIS — M329 Systemic lupus erythematosus, unspecified: Secondary | ICD-10-CM | POA: Insufficient documentation

## 2017-06-28 DIAGNOSIS — Z7984 Long term (current) use of oral hypoglycemic drugs: Secondary | ICD-10-CM | POA: Diagnosis not present

## 2017-06-28 DIAGNOSIS — I447 Left bundle-branch block, unspecified: Secondary | ICD-10-CM | POA: Diagnosis not present

## 2017-06-28 DIAGNOSIS — R0789 Other chest pain: Principal | ICD-10-CM | POA: Insufficient documentation

## 2017-06-28 DIAGNOSIS — Z91041 Radiographic dye allergy status: Secondary | ICD-10-CM | POA: Insufficient documentation

## 2017-06-28 DIAGNOSIS — Z87891 Personal history of nicotine dependence: Secondary | ICD-10-CM | POA: Diagnosis not present

## 2017-06-28 DIAGNOSIS — R9431 Abnormal electrocardiogram [ECG] [EKG]: Secondary | ICD-10-CM | POA: Diagnosis not present

## 2017-06-28 DIAGNOSIS — Z952 Presence of prosthetic heart valve: Secondary | ICD-10-CM | POA: Insufficient documentation

## 2017-06-28 DIAGNOSIS — Z7982 Long term (current) use of aspirin: Secondary | ICD-10-CM | POA: Diagnosis not present

## 2017-06-28 DIAGNOSIS — I252 Old myocardial infarction: Secondary | ICD-10-CM | POA: Insufficient documentation

## 2017-06-28 DIAGNOSIS — I2510 Atherosclerotic heart disease of native coronary artery without angina pectoris: Secondary | ICD-10-CM | POA: Diagnosis not present

## 2017-06-28 LAB — CBC
HCT: 34.2 % — ABNORMAL LOW (ref 35.0–47.0)
HEMOGLOBIN: 10.3 g/dL — AB (ref 12.0–16.0)
MCH: 20.6 pg — ABNORMAL LOW (ref 26.0–34.0)
MCHC: 30.2 g/dL — ABNORMAL LOW (ref 32.0–36.0)
MCV: 68.2 fL — ABNORMAL LOW (ref 80.0–100.0)
Platelets: 240 10*3/uL (ref 150–440)
RBC: 5.02 MIL/uL (ref 3.80–5.20)
RDW: 19.2 % — ABNORMAL HIGH (ref 11.5–14.5)
WBC: 8.3 10*3/uL (ref 3.6–11.0)

## 2017-06-28 LAB — APTT: aPTT: 48 seconds — ABNORMAL HIGH (ref 24–36)

## 2017-06-28 LAB — BASIC METABOLIC PANEL
ANION GAP: 7 (ref 5–15)
BUN: 13 mg/dL (ref 6–20)
CO2: 29 mmol/L (ref 22–32)
Calcium: 8.9 mg/dL (ref 8.9–10.3)
Chloride: 106 mmol/L (ref 101–111)
Creatinine, Ser: 0.86 mg/dL (ref 0.44–1.00)
Glucose, Bld: 102 mg/dL — ABNORMAL HIGH (ref 65–99)
POTASSIUM: 4.4 mmol/L (ref 3.5–5.1)
Sodium: 142 mmol/L (ref 135–145)

## 2017-06-28 LAB — PROTIME-INR
INR: 2.63
Prothrombin Time: 27.9 seconds — ABNORMAL HIGH (ref 11.4–15.2)

## 2017-06-28 LAB — TROPONIN I: Troponin I: <0.03 ng/mL (ref <0.03)

## 2017-06-28 MED ORDER — MOMETASONE FURO-FORMOTEROL FUM 200-5 MCG/ACT IN AERO
2.0000 | INHALATION_SPRAY | Freq: Two times a day (BID) | RESPIRATORY_TRACT | Status: DC
  Administered 2017-06-28 – 2017-06-29 (×2): 2 via RESPIRATORY_TRACT
  Filled 2017-06-28: qty 8.8

## 2017-06-28 MED ORDER — ATORVASTATIN CALCIUM 20 MG PO TABS: 40.0000 mg | ORAL_TABLET | Freq: Every day | ORAL | Status: DC

## 2017-06-28 MED ORDER — ONDANSETRON 4 MG PO TBDP
4.0000 mg | ORAL_TABLET | Freq: Once | ORAL | Status: AC | PRN
  Administered 2017-06-28: 4 mg via ORAL
  Filled 2017-06-28: qty 1

## 2017-06-28 MED ORDER — OLANZAPINE 5 MG PO TABS
5.0000 mg | ORAL_TABLET | Freq: Every day | ORAL | Status: DC
  Administered 2017-06-28: 5 mg via ORAL
  Filled 2017-06-28 (×2): qty 1

## 2017-06-28 MED ORDER — HYDROCODONE-ACETAMINOPHEN 5-325 MG PO TABS
1.0000 | ORAL_TABLET | Freq: Four times a day (QID) | ORAL | Status: DC | PRN
  Administered 2017-06-28 – 2017-06-29 (×3): 1 via ORAL
  Filled 2017-06-28 (×3): qty 1

## 2017-06-28 MED ORDER — ASPIRIN 81 MG PO CHEW
324.0000 mg | CHEWABLE_TABLET | ORAL | Status: AC
  Administered 2017-06-28: 324 mg via ORAL
  Filled 2017-06-28: qty 4

## 2017-06-28 MED ORDER — PANTOPRAZOLE SODIUM 40 MG PO TBEC
40.0000 mg | DELAYED_RELEASE_TABLET | Freq: Every day | ORAL | Status: DC
  Administered 2017-06-28 – 2017-06-29 (×2): 40 mg via ORAL
  Filled 2017-06-28 (×2): qty 1

## 2017-06-28 MED ORDER — SUCRALFATE 1 G PO TABS
1.0000 g | ORAL_TABLET | Freq: Four times a day (QID) | ORAL | Status: DC
  Administered 2017-06-28 – 2017-06-29 (×2): 1 g via ORAL
  Filled 2017-06-28 (×2): qty 1

## 2017-06-28 MED ORDER — ENOXAPARIN SODIUM 120 MG/0.8ML ~~LOC~~ SOLN
120.0000 mg | Freq: Two times a day (BID) | SUBCUTANEOUS | Status: DC
  Filled 2017-06-28 (×2): qty 0.8

## 2017-06-28 MED ORDER — NITROGLYCERIN 0.4 MG SL SUBL
SUBLINGUAL_TABLET | SUBLINGUAL | Status: AC
  Administered 2017-06-28: 0.4 mg via SUBLINGUAL
  Filled 2017-06-28: qty 1

## 2017-06-28 MED ORDER — ALBUTEROL SULFATE (2.5 MG/3ML) 0.083% IN NEBU: 2.5000 mg | INHALATION_SOLUTION | Freq: Four times a day (QID) | RESPIRATORY_TRACT | Status: DC | PRN

## 2017-06-28 MED ORDER — ONDANSETRON HCL 4 MG/2ML IJ SOLN: 4.0000 mg | Freq: Four times a day (QID) | INTRAMUSCULAR | Status: DC | PRN

## 2017-06-28 MED ORDER — NITROGLYCERIN 0.4 MG SL SUBL
0.4000 mg | SUBLINGUAL_TABLET | SUBLINGUAL | Status: DC | PRN
  Administered 2017-06-28 – 2017-06-29 (×4): 0.4 mg via SUBLINGUAL

## 2017-06-28 MED ORDER — ASPIRIN 300 MG RE SUPP: 300.0000 mg | RECTAL | Status: AC

## 2017-06-28 MED ORDER — SODIUM CHLORIDE 0.9 % IV SOLN
INTRAVENOUS | Status: DC
  Administered 2017-06-28: 22:00:00 via INTRAVENOUS

## 2017-06-28 MED ORDER — ASPIRIN EC 81 MG PO TBEC
81.0000 mg | DELAYED_RELEASE_TABLET | Freq: Every day | ORAL | Status: DC
  Administered 2017-06-29: 81 mg via ORAL
  Filled 2017-06-28: qty 1

## 2017-06-28 MED ORDER — SODIUM CHLORIDE 0.9 % IV BOLUS
1000.0000 mL | Freq: Once | INTRAVENOUS | Status: AC
  Administered 2017-06-28: 1000 mL via INTRAVENOUS

## 2017-06-28 MED ORDER — HEPARIN (PORCINE) IN NACL 100-0.45 UNIT/ML-% IJ SOLN
1250.0000 [IU]/h | INTRAMUSCULAR | Status: DC
  Administered 2017-06-28: 950 [IU]/h via INTRAVENOUS
  Filled 2017-06-28: qty 250

## 2017-06-28 MED ORDER — HEPARIN BOLUS VIA INFUSION
4000.0000 [IU] | Freq: Once | INTRAVENOUS | Status: AC
  Administered 2017-06-28: 4000 [IU] via INTRAVENOUS
  Filled 2017-06-28: qty 4000

## 2017-06-28 MED ORDER — METOPROLOL TARTRATE 25 MG PO TABS
25.0000 mg | ORAL_TABLET | Freq: Two times a day (BID) | ORAL | Status: DC
  Administered 2017-06-28 – 2017-06-29 (×2): 25 mg via ORAL
  Filled 2017-06-28 (×2): qty 1

## 2017-06-28 MED ORDER — ONDANSETRON HCL 4 MG/2ML IJ SOLN
4.0000 mg | Freq: Once | INTRAMUSCULAR | Status: AC
  Administered 2017-06-28: 4 mg via INTRAVENOUS
  Filled 2017-06-28: qty 2

## 2017-06-28 MED ORDER — PRAVASTATIN SODIUM 40 MG PO TABS
40.0000 mg | ORAL_TABLET | Freq: Every day | ORAL | Status: DC
  Administered 2017-06-28: 40 mg via ORAL
  Filled 2017-06-28: qty 1

## 2017-06-28 MED ORDER — AMLODIPINE BESYLATE 5 MG PO TABS
5.0000 mg | ORAL_TABLET | Freq: Every day | ORAL | Status: DC
  Administered 2017-06-29: 5 mg via ORAL
  Filled 2017-06-28: qty 1

## 2017-06-28 MED ORDER — DIVALPROEX SODIUM 500 MG PO DR TAB
500.0000 mg | DELAYED_RELEASE_TABLET | Freq: Three times a day (TID) | ORAL | Status: DC
Start: 2017-06-28 — End: 2017-06-29
  Administered 2017-06-28 – 2017-06-29 (×2): 500 mg via ORAL
  Filled 2017-06-28 (×4): qty 1

## 2017-06-28 MED ORDER — ACETAMINOPHEN 500 MG PO TABS: 500.0000 mg | ORAL_TABLET | Freq: Four times a day (QID) | ORAL | Status: DC | PRN

## 2017-06-28 NOTE — Progress Notes (Signed)
Advanced care plan.  Purpose of the Encounter: CODE STATUS  Parties in Attendance:Patient  Patient's Decision Capacity:Good  Subjective/Patient's story: Presented with chest pain  Objective/Medical story History of coronary artery disease with diabetes, valve replacement admitted for chest pain with abnormal EKG  Goals of care determination: Advanced directives discussed Patient wants everything done that is cardiac resuscitation, intubation and ventilator if the need arises  CODE STATUS:Full  Code   Time spent discussing advanced care planning: 16 minutes

## 2017-06-28 NOTE — ED Notes (Signed)
Pt states she text her PCP a few days ago and told them she was having irregular heart rate - pt went to PCP today for labs and told them she had vomited x4 and loose stools x3 today - pt c/o chest pain (pressure/tightness) and shortness of breath off and on "forever" - she states she has been dx with heartburn everytime she has been seen for the chest pain

## 2017-06-28 NOTE — ED Provider Notes (Signed)
[facility] Emergency Department Provider Note  Time seen: 6:20 PM  I have reviewed the triage vital signs and the nursing notes.   HISTORY  Chief Complaint Chest Pain and Nausea    HPI [name] is a 52 y.o. female with a past medical history of CAD, prior MI per patient, diabetes, hypertension, lupus, presents to the emergency department for nausea vomiting diarrhea and chest pain.  According to the patient since 4 AM this morning she has been nauseated with frequent episodes of vomiting and diarrhea.  Last episode of vomiting and diarrhea approximately 3 hours ago.  Patient went to her primary care doctor today to have her INR checked, mentioned the nausea vomiting and diarrhea but also mentioned for the past few days she has been experiencing chest pain and pressure in the center of the left chest.  PCP sent the patient to the emergency department for further workup.  Patient states her INR was elevated at 4.1 and she will adjust her Coumadin appropriately.  Patient states mild chest pressure currently denies any shortness of breath or diaphoresis but does state nausea but is also having vomiting and diarrhea.   Past Medical History:  Diagnosis Date  . CAD (coronary artery disease)    per cardiac MRI  . Diabetes mellitus without complication (HCC)   . Hypertension   . Lupus (systemic lupus erythematosus) (HCC)     Patient Active Problem List   Diagnosis Date Noted  . 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 Atrial fibrillation (HCC) 07/02/2016  . Anxiety, generalized 06/27/2016  . Chest pain 04/21/2016  . 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 ASHD (arteriosclerotic heart disease) 11/02/2011  . Chronic back pain 11/02/2011    Past Surgical History:  Procedure Laterality Date  . APPENDECTOMY    . CESAREAN SECTION    . KNEE ARTHROSCOPY    . TONSILLECTOMY    . VALVE REPLACEMENT       Prior to Admission medications   Medication Sig Start Date End Date Taking? Authorizing Provider  acetaminophen (TYLENOL) 500 MG tablet Take 500 mg by mouth every 6 (six) hours as needed.    [provider]  albuterol (PROVENTIL HFA;VENTOLIN HFA) 108 (90 Base) MCG/ACT inhaler Inhale 1-2 puffs into the lungs every 6 (six) hours as needed for wheezing or shortness of breath.    [provider]  amLODipine (NORVASC) 5 MG tablet Take 5 mg by mouth daily.    [provider]  aspirin 81 MG tablet Take 81 mg by mouth daily.    [provider]  budesonide-formoterol (SYMBICORT) 160-4.5 MCG/ACT inhaler Inhale 2 puffs into the lungs 2 (two) times daily. 01/14/16 06/03/17  [provider]  doxycycline (VIBRAMYCIN) 100 MG capsule Take 1 capsule (100 mg total) by mouth 2 (two) times daily. 06/03/17   Tommie Samsook, Jayce G, DO  famotidine (PEPCID) 40 MG tablet Take 1 tablet (40 mg total) by mouth every evening. 12/13/16 12/13/17  Phineas SemenGoodman, Graydon, MD  FLUoxetine (PROZAC) 40 MG capsule Take 40 mg by mouth 2 (two) times daily.     [provider]  furosemide (LASIX) 20 MG tablet Take 20 mg by mouth daily as needed.  07/18/16 07/18/17  [provider]  HYDROcodone-acetaminophen (NORCO/VICODIN) 5-325 MG tablet Take 1 tablet by mouth every 4 (four) hours as needed for moderate pain.    [provider]  lidocaine (LIDODERM) 5 % Place 1 patch onto  the skin every 12 (twelve) hours. Remove & Discard patch within 12 hours or as directed by MD 10/17/16 10/17/17  Menshew, Charlesetta Ivory, PA-C  metFORMIN (GLUCOPHAGE) 500 MG tablet Take 500 mg by mouth 2 (two) times daily with a meal.     [provider]  methocarbamol (ROBAXIN) 500 MG tablet Take 1 tablet (500 mg total) by mouth 2 (two) times daily. 01/06/15   Payton Mccallum, MD  metoprolol tartrate (LOPRESSOR) 25 MG tablet Take 25 mg by mouth 2 (two) times daily.    [provider]  omeprazole  (PRILOSEC) 40 MG capsule Take 40 mg by mouth daily.    [provider]  Oxcarbazepine (TRILEPTAL) 300 MG tablet Take 300 mg by mouth 2 (two) times daily.    [provider]  potassium chloride SA (K-DUR,KLOR-CON) 20 MEQ tablet Take 20 mEq by mouth daily.     [provider]  pravastatin (PRAVACHOL) 20 MG tablet Take 20 mg by mouth at bedtime.     [provider]  predniSONE (DELTASONE) 50 MG tablet 50 mg daily for 5 days. 06/03/17   Tommie Sams, DO  ranitidine (ZANTAC) 300 MG tablet Take 300 mg by mouth at bedtime.    [provider]  sennosides-docusate sodium (SENOKOT-S) 8.6-50 MG tablet Take 1 tablet by mouth at bedtime.    [provider]  sucralfate (CARAFATE) 1 g tablet Take 1 tablet (1 g total) by mouth 4 (four) times daily. 12/13/16   Phineas Semen, MD  Vitamin D, Ergocalciferol, (DRISDOL) 50000 units CAPS capsule Take 50,000 Units by mouth every 7 (seven) days.    [provider]  warfarin (COUMADIN) 5 MG tablet Take 5 mg by mouth daily.     [provider]    Allergies  Allergen Reactions  . Flexeril [Cyclobenzaprine] Swelling  . Celecoxib Hives  . Gabapentin Hives  . Iodinated Diagnostic Agents Hives    Can tolerate. Night before reaction pt ate a lot of shellfish.  . Morphine And Related Swelling  . Percocet [Oxycodone-Acetaminophen] Hives  . Skelaxin [Metaxalone] Hives  . Cataflam [Diclofenac] Hives  . Demerol [Meperidine] Hives  . Nortriptyline     lightheaded'  . Tramadol Hives  . Ketorolac Rash  . Norflex [Orphenadrine Citrate] Rash    Family History  Problem Relation Age of Onset  . Hyperlipidemia Mother   . Hypertension Father   . Diabetes Father   . Hyperlipidemia Father     Social History Social History   Tobacco Use  . Smoking status: Former Smoker    Packs/day: 0.50    Years: 15.00    Pack years: 7.50    Types: Cigarettes    Last attempt to quit: 06/26/2016    Years since  quitting: 1.0  . Smokeless tobacco: Never Used  Substance Use Topics  . Alcohol use: No  . Drug use: No    Review of Systems Constitutional: Negative for fever. Eyes: Negative for visual complaints ENT: Negative for recent illness/congestion Cardiovascular: Positive for chest pain Respiratory: Negative for shortness of breath. Gastrointestinal: Mild abdominal cramping.  Positive for nausea vomiting diarrhea today. Genitourinary: Negative for urinary compaints Musculoskeletal: Negative for leg pain.  Does state mild increase in swelling over the past 1 week. Skin: Negative for skin complaints  Neurological: Negative for headache All other ROS negative  ____________________________________________   PHYSICAL EXAM:  VITAL SIGNS: ED Triage Vitals  Enc Vitals Group     BP 06/28/17 1623 (!) 126/52  Pulse Rate 06/28/17 1623 88     Resp 06/28/17 1623 19     Temp 06/28/17 1623 99.1 F (37.3 C)     Temp Source 06/28/17 1623 Oral     SpO2 --      Weight 06/28/17 1621 265 lb (120.2 kg)     Height 06/28/17 1621 4\' 11"  (1.499 m)     Head Circumference --      Peak Flow --      Pain Score 06/28/17 1618 7     Pain Loc --      Pain Edu? --      Excl. in GC? --     Constitutional: Alert and oriented. Well appearing and in no distress. Eyes: Normal exam ENT   Head: Normocephalic and atraumatic.   Mouth/Throat: Mucous membranes are moist. Cardiovascular: Normal rate, regular rhythm. Respiratory: Normal respiratory effort without tachypnea nor retractions. Breath sounds are clear  Gastrointestinal: Soft and nontender. No distention.  Musculoskeletal: Nontender with normal range of motion in all extremities.  Mild pedal edema bilaterally, no calf tenderness to palpation. Neurologic:  Normal speech and language. No gross focal neurologic deficits  Skin:  Skin is warm, dry and intact.  Psychiatric: Mood and affect are normal.   ____________________________________________     EKG  EKG reviewed and interpreted by myself shows normal sinus rhythm at 91 bpm with a widened QRS, normal axis, slight QTC prolongation.  Patient does have inferolateral T wave inversions.  Compared to her old EKGs she has old lateral T wave inversions however she does not appear to have inferior T wave inversions previously.  Last EKG was 05/01/17 showing lateral T wave inversions but no inferior T wave inversions.  ____________________________________________    RADIOLOGY  Chest x-ray normal  ____________________________________________   INITIAL IMPRESSION / ASSESSMENT AND PLAN / ED COURSE  Pertinent labs & imaging results that were available during my care of the patient were reviewed by me and considered in my medical decision making (see chart for details).  Patient presents to the emergency department for chest discomfort nausea vomiting diarrhea.  States the chest discomfort has been intermittent over the past several days, nausea vomiting diarrhea started acutely at 4 AM.  Differential would include gastroenteritis, ACS, left light/metabolic abnormality.  Patient's labs have resulted largely within normal limits including a negative troponin.  Patient's EKG does show inferior T wave inversions which appear new compared to 05/01/17 and previous EKGs before that.  According to record review patient appears to have had an aortic valve replacement, for which she is on Coumadin.  Given the new EKG changes complaints of chest discomfort I anticipate likely admission to the hospital for further workup.   ____________________________________________   FINAL CLINICAL IMPRESSION(S) / ED DIAGNOSES  Chest pain EKG changes Nausea vomiting diarrhea    Minna Antis, MD 06/28/17 1921

## 2017-06-28 NOTE — H&P (Addendum)
[facility] [place] [facility]   PATIENT NAME: Wendy ShortenKari Woodward    MR#:  [id number]  DATE OF BIRTH:  11/17/1965  DATE OF ADMISSION:  06/28/2017  PRIMARY CARE PHYSICIAN: [name], Wendy A, MD   REQUESTING/REFERRING PHYSICIAN:   CHIEF COMPLAINT:   Chief Complaint  Patient presents with  . Chest Pain  . Nausea    HISTORY OF PRESENT ILLNESS: [name]  is Woodward 52 y.o. female with Woodward known history of coronary artery disease, diabetes mellitus type 2, hypertension, lupus erythematosus, valve replacement on Coumadin presented to the emergency room with nausea vomiting diarrhea and chest pain.  The diarrhea was watery stool.  The chest pain was located in the retrosternal area was sharp in nature 5 out of 10 on Woodward scale of 1-10.  EKG showed T wave inversions in the inferior leads.  First set of troponin is negative patient is on Coumadin for anticoagulation as outpatient.  Fever and chills.  No complaints of any abdominal pain.  Hospitalist service was consulted for further care.  PAST MEDICAL HISTORY:   Past Medical History:  Diagnosis Date  . CAD (coronary artery disease)    per cardiac MRI  . Diabetes mellitus without complication (HCC)   . Hypertension   . Lupus (systemic lupus erythematosus) (HCC)     PAST SURGICAL HISTORY:  Past Surgical History:  Procedure Laterality Date  . APPENDECTOMY    . CESAREAN SECTION    . KNEE ARTHROSCOPY    . TONSILLECTOMY    . VALVE REPLACEMENT      SOCIAL HISTORY:  Social History   Tobacco Use  . Smoking status: Former Smoker    Packs/day: 0.50    Years: 15.00    Pack years: 7.50    Types: Cigarettes    Last attempt to quit: 06/26/2016    Years since quitting: 1.0  . Smokeless tobacco: Never Used  Substance Use Topics  . Alcohol use: No    FAMILY HISTORY:  Family History  Problem Relation Age of Onset  . Hyperlipidemia Mother   . Hypertension Father   . Diabetes Father   . Hyperlipidemia Father     DRUG  ALLERGIES:  Allergies  Allergen Reactions  . Flexeril [Cyclobenzaprine] Swelling  . Celecoxib Hives  . Gabapentin Hives  . Iodinated Diagnostic Agents Hives    Can tolerate. Night before reaction pt ate Woodward lot of shellfish.  . Morphine And Related Swelling  . Percocet [Oxycodone-Acetaminophen] Hives  . Skelaxin [Metaxalone] Hives  . Cataflam [Diclofenac] Hives  . Demerol [Meperidine] Hives  . Nortriptyline     lightheaded'  . Tramadol Hives  . Ketorolac Rash  . Norflex [Orphenadrine Citrate] Rash    REVIEW OF SYSTEMS:   CONSTITUTIONAL: No fever, fatigue or weakness.  EYES: No blurred or double vision.  EARS, NOSE, AND THROAT: No tinnitus or ear pain.  RESPIRATORY: No cough, shortness of breath, wheezing or hemoptysis.  CARDIOVASCULAR: Has chest pain, no orthopnea, edema.  GASTROINTESTINAL: Has nausea, vomiting, diarrhea  no abdominal pain.  GENITOURINARY: No dysuria, hematuria.  ENDOCRINE: No polyuria, nocturia,  HEMATOLOGY: No anemia, easy bruising or bleeding SKIN: No rash or lesion. MUSCULOSKELETAL: No joint pain or arthritis.   NEUROLOGIC: No tingling, numbness, weakness.  PSYCHIATRY: No anxiety or depression.   MEDICATIONS AT HOME:  Prior to Admission medications   Medication Sig Start Date End Date Taking? Authorizing Provider  acetaminophen (TYLENOL) 500 MG tablet Take 500 mg by mouth every 6 (six)  hours as needed for mild pain.    Yes [provider]  albuterol (PROVENTIL HFA;VENTOLIN HFA) 108 (90 Base) MCG/ACT inhaler Inhale 1-2 puffs into the lungs every 6 (six) hours as needed for wheezing or shortness of breath.   Yes [provider]  amLODipine (NORVASC) 5 MG tablet Take 5 mg by mouth daily.   Yes [provider]  aspirin 81 MG tablet Take 81 mg by mouth daily.   Yes [provider]  budesonide-formoterol (SYMBICORT) 160-4.5 MCG/ACT inhaler Inhale 2 puffs into the lungs 2 (two) times daily. 01/14/16 06/28/17 Yes [provider]  Divalproex Sodium (DEPAKOTE PO) Take by mouth. Take 500MG  by mouth every morning and 750MG  by mouth every evening   Yes [provider]  esomeprazole (NEXIUM) 40 MG capsule Take 40 mg by mouth daily at 12 noon.   Yes [provider]  furosemide (LASIX) 20 MG tablet Take 20 mg by mouth daily as needed for fluid or edema.  07/18/16 07/18/17 Yes [provider]  HYDROcodone-acetaminophen (NORCO/VICODIN) 5-325 MG tablet Take 1 tablet by mouth 4 (four) times daily.    Yes [provider]  metFORMIN (GLUCOPHAGE) 500 MG tablet Take 500 mg by mouth 2 (two) times daily with Woodward meal.    Yes [provider]  metoprolol tartrate (LOPRESSOR) 25 MG tablet Take 25 mg by mouth 2 (two) times daily.   Yes [provider]  OLANZapine (ZYPREXA) 5 MG tablet Take 5 mg by mouth at bedtime.   Yes [provider]  potassium chloride SA (K-DUR,KLOR-CON) 20 MEQ tablet Take 20 mEq by mouth daily.    Yes [provider]  pravastatin (PRAVACHOL) 40 MG tablet Take 40 mg by mouth at bedtime.    Yes [provider]  sucralfate (CARAFATE) 1 g tablet Take 1 tablet (1 g total) by mouth 4 (four) times daily. 12/13/16  Yes Wendy Semen, MD  warfarin (COUMADIN) 5 MG tablet Take 2MG  by mouth every evening Thursday to Tuesday and 5MG  by mouth on Wednesday evening   Yes [provider]  doxycycline (VIBRAMYCIN) 100 MG capsule Take 1 capsule (100 mg total) by mouth 2 (two) times daily. Patient not taking: Reported on 06/28/2017 06/03/17   Wendy Sams, DO  famotidine (PEPCID) 40 MG tablet Take 1 tablet (40 mg total) by mouth every evening. Patient not taking: Reported on 06/28/2017 12/13/16 12/13/17  Wendy Semen, MD  lidocaine (LIDODERM) 5 % Place 1 patch onto the skin every 12 (twelve) hours. Remove & Discard patch within 12 hours or as directed by MD Patient not taking: Reported on 06/28/2017 10/17/16 10/17/17  [name], Wendy Ivory, PA-C   methocarbamol (ROBAXIN) 500 MG tablet Take 1 tablet (500 mg total) by mouth 2 (two) times daily. Patient not taking: Reported on 06/28/2017 01/06/15   Wendy Mccallum, MD  predniSONE (DELTASONE) 50 MG tablet 50 mg daily for 5 days. Patient not taking: Reported on 06/28/2017 06/03/17   Wendy Sams, DO      PHYSICAL EXAMINATION:   VITAL SIGNS: Blood pressure 95/63, pulse 86, temperature 99.1 F (37.3 C), temperature source Oral, resp. rate 19, height 4\' 11"  (1.499 Woodward), weight 120.2 kg (265 lb), SpO2 100 %.  GENERAL:  52 y.o.-year-old patient lying in the bed with no acute distress.  EYES: Pupils equal, round, reactive to light and accommodation. No scleral icterus. Extraocular muscles intact.  HEENT: Head atraumatic, normocephalic. Oropharynx and nasopharynx clear.  NECK:  Supple, no jugular venous  distention. No thyroid enlargement, no tenderness.  LUNGS: Normal breath sounds bilaterally, no wheezing, rales,rhonchi or crepitation. No use of accessory muscles of respiration.  Midline scar on chest CARDIOVASCULAR: S1, S2 normal. No murmurs, rubs, or gallops.  ABDOMEN: Soft, nontender, nondistended. Bowel sounds present. No organomegaly or mass.  EXTREMITIES: No pedal edema, cyanosis, or clubbing.  NEUROLOGIC: Cranial nerves II through XII are intact. Muscle strength 5/5 in all extremities. Sensation intact. Gait not checked.  PSYCHIATRIC: The patient is alert and oriented x 3.  SKIN: No obvious rash, lesion, or ulcer.   LABORATORY PANEL:   CBC Recent Labs  Lab 06/28/17 1629  WBC 8.3  HGB 10.3*  HCT 34.2*  PLT 240  MCV 68.2*  MCH 20.6*  MCHC 30.2*  RDW 19.2*   ------------------------------------------------------------------------------------------------------------------  Chemistries  Recent Labs  Lab 06/28/17 1629  NA 142  K 4.4  CL 106  CO2 29  GLUCOSE 102*  BUN 13  CREATININE 0.86  CALCIUM 8.9    ------------------------------------------------------------------------------------------------------------------ estimated creatinine clearance is 92.4 mL/min (by C-G formula based on SCr of 0.86 mg/dL). ------------------------------------------------------------------------------------------------------------------ No results for input(s): TSH, T4TOTAL, T3FREE, THYROIDAB in the last 72 hours.  Invalid input(s): FREET3   Coagulation profile No results for input(s): INR, PROTIME in the last 168 hours. ------------------------------------------------------------------------------------------------------------------- No results for input(s): DDIMER in the last 72 hours. -------------------------------------------------------------------------------------------------------------------  Cardiac Enzymes Recent Labs  Lab 06/28/17 1629  TROPONINI <0.03   ------------------------------------------------------------------------------------------------------------------ Invalid input(s): POCBNP  ---------------------------------------------------------------------------------------------------------------  Urinalysis    Component Value Date/Time   COLORURINE YELLOW (Woodward) 10/17/2016 1610   APPEARANCEUR CLEAR (Woodward) 10/17/2016 1610   LABSPEC 1.014 10/17/2016 1610   PHURINE 5.0 10/17/2016 1610   GLUCOSEU NEGATIVE 10/17/2016 1610   HGBUR NEGATIVE 10/17/2016 1610   BILIRUBINUR NEGATIVE 10/17/2016 1610   KETONESUR NEGATIVE 10/17/2016 1610   PROTEINUR NEGATIVE 10/17/2016 1610   NITRITE NEGATIVE 10/17/2016 1610   LEUKOCYTESUR NEGATIVE 10/17/2016 1610     RADIOLOGY: Dg Chest 2 View  Result Date: 06/28/2017 CLINICAL DATA:  Tachycardia and chest pressure for Woodward few days. Nausea, vomiting and diarrhea beginning today. Suspected dehydration. History of lupus, hypertension and diabetes. EXAM: CHEST - 2 VIEW COMPARISON:  Chest radiograph June 03, 2017 FINDINGS: Cardiomediastinal silhouette is  normal. Status post median sternotomy for cardiac valve replacement. No pleural effusions or focal consolidations. Trachea projects midline and there is no pneumothorax. Soft tissue planes and included osseous structures are non-suspicious. Surgical clips in the included right abdomen compatible with cholecystectomy. Large body habitus. Mild degenerative change of the thoracic spine. IMPRESSION: No active cardiopulmonary disease. Electronically Signed   By: Awilda Metro Woodward.D.   On: 06/28/2017 17:27    EKG: Orders placed or performed during the hospital encounter of 06/28/17  . EKG 12-Lead  . EKG 12-Lead  . ED EKG within 10 minutes  . ED EKG within 10 minutes    IMPRESSION AND PLAN:  52 year old female patient with history of diabetes mellitus type 2, valve replacement on Coumadin, hypertension, lupus erythematosus presented to the emergency room with chest pain, nausea and vomiting and diarrhea.  1.  Chest pain with abnormal EKG Admit patient to telemetry observation bed Cycle troponin to rule out ischemia Check echocardiogram Start patient on aspirin Cardiology consultation  2 acute gastroenteritis.  Viral in etiology IV fluids  3.  Diabetes mellitus type 2 Sliding scale coverage with insulin Diabetic diet  4.  History of valve replacement IV heparin drip for anticoagulation Hold coumadin for now  5.  DVT  prophylaxis On heparin drip  All the records are reviewed and case discussed with ED provider. Management plans discussed with the patient, family and they are in agreement.  CODE STATUS:FULL CODE    Code Status Orders  (From admission, onward)        Start     Ordered   06/28/17 2050  Full code  Continuous     06/28/17 2049    Code Status History    Date Active Date Inactive Code Status Order ID Comments User Context   04/21/2016 0555 04/21/2016 1846 Full Code [id number]  Arnaldo Natal, MD Inpatient       TOTAL TIME TAKING CARE OF THIS PATIENT: 50  minutes.    Ihor Austin Woodward.D on 06/28/2017 at 8:51 PM  Between 7am to 6pm - Pager - [phone]  After 6pm go to [email or web address] - password [email or web address] [facility]  [place]  [facility]  Office  [phone]  CC: Primary care physician; Wendy Humphrey, MD

## 2017-06-28 NOTE — ED Notes (Signed)
Pt assisted to toilet to void 

## 2017-06-28 NOTE — ED Triage Notes (Signed)
Pt arrived with complaints of racing heart and chest pressure. Pt states is started a few days and described the pain as a pressure. No pain radiation.  Pt also reports n/v/d since 0400 today and went into her doctor for evaluation. Doctor was worried the tachycardia was due to dehydration and wanted her to be evaluated in the ED.

## 2017-06-28 NOTE — Consult Note (Signed)
ANTICOAGULATION CONSULT NOTE - Initial Consult  Pharmacy Consult for Heparin  Indication: chest pain/ACS  Allergies  Allergen Reactions  . Flexeril [Cyclobenzaprine] Swelling  . Celecoxib Hives  . Gabapentin Hives  . Iodinated Diagnostic Agents Hives    Can tolerate. Night before reaction pt ate a lot of shellfish.  . Morphine And Related Swelling  . Percocet [Oxycodone-Acetaminophen] Hives  . Skelaxin [Metaxalone] Hives  . Cataflam [Diclofenac] Hives  . Demerol [Meperidine] Hives  . Nortriptyline     lightheaded'  . Tramadol Hives  . Ketorolac Rash  . Norflex [Orphenadrine Citrate] Rash    Patient Measurements: Height: 4\' 11"  (149.9 cm) Weight: 265 lb (120.2 kg) IBW/kg (Calculated) : 43.2 Heparin Dosing Weight: 76kg   Vital Signs: Temp: 99.1 F (37.3 C) (04/03 1623) Temp Source: Oral (04/03 1623) BP: 95/63 (04/03 2000) Pulse Rate: 86 (04/03 2000)  Labs: Recent Labs    06/28/17 1629  HGB 10.3*  HCT 34.2*  PLT 240  CREATININE 0.86  TROPONINI <0.03    Estimated Creatinine Clearance: 92.4 mL/min (by C-G formula based on SCr of 0.86 mg/dL).   Medical History: Past Medical History:  Diagnosis Date  . CAD (coronary artery disease)    per cardiac MRI  . Diabetes mellitus without complication (HCC)   . Hypertension   . Lupus (systemic lupus erythematosus) (HCC)    Assessment: Pharmacy consulted for heparin drip dosing and monitoring in 52 yo female admitted with chest pain. Patient has PMH of A.Fib and was taking Warfarin PTA.   Goal of Therapy:  Heparin level 0.3-0.7 units/ml Monitor platelets by anticoagulation protocol: Yes   Plan:  Baseline labs ordered  Give 4000 units bolus x 1 Start heparin infusion at 950 units/hr Check anti-Xa level in 6 hours and daily while on heparin Continue to monitor H&H and platelets  Gardner CandleSheema M Burnie Therien, PharmD, BCPS Clinical Pharmacist 06/28/2017 8:41 PM

## 2017-06-29 ENCOUNTER — Observation Stay
Admit: 2017-06-29 | Discharge: 2017-06-29 | Disposition: A | Discharge status: Home / Self Care | Payer: Managed Care, Other (non HMO) | Attending: Internal Medicine | Admitting: Internal Medicine

## 2017-06-29 LAB — CBC
HEMATOCRIT: 31.9 % — AB (ref 35.0–47.0)
Hemoglobin: 9.3 g/dL — ABNORMAL LOW (ref 12.0–16.0)
MCH: 20.2 pg — ABNORMAL LOW (ref 26.0–34.0)
MCHC: 29.1 g/dL — ABNORMAL LOW (ref 32.0–36.0)
MCV: 69.4 fL — AB (ref 80.0–100.0)
Platelets: 200 10*3/uL (ref 150–440)
RBC: 4.59 MIL/uL (ref 3.80–5.20)
RDW: 19.1 % — ABNORMAL HIGH (ref 11.5–14.5)
WBC: 6.3 10*3/uL (ref 3.6–11.0)

## 2017-06-29 LAB — HEPARIN LEVEL (UNFRACTIONATED)

## 2017-06-29 LAB — GLUCOSE, CAPILLARY: Glucose-Capillary: 146 mg/dL — ABNORMAL HIGH (ref 65–99)

## 2017-06-29 LAB — BASIC METABOLIC PANEL
Anion gap: 7 (ref 5–15)
BUN: 14 mg/dL (ref 6–20)
CHLORIDE: 106 mmol/L (ref 101–111)
CO2: 28 mmol/L (ref 22–32)
Calcium: 8.2 mg/dL — ABNORMAL LOW (ref 8.9–10.3)
Creatinine, Ser: 0.86 mg/dL (ref 0.44–1.00)
GFR calc non Af Amer: >60 mL/min (ref >60)
Glucose, Bld: 92 mg/dL (ref 65–99)
POTASSIUM: 4.1 mmol/L (ref 3.5–5.1)
SODIUM: 141 mmol/L (ref 135–145)

## 2017-06-29 LAB — ECHOCARDIOGRAM COMPLETE
Height: 59 in
WEIGHTICAEL: 4240 [oz_av]

## 2017-06-29 LAB — LIPID PANEL
Cholesterol: 160 mg/dL (ref 0–200)
HDL: 23 mg/dL — AB (ref >40)
LDL Cholesterol: 61 mg/dL (ref 0–99)
Total CHOL/HDL Ratio: 7 RATIO
Triglycerides: 381 mg/dL — ABNORMAL HIGH (ref <150)
VLDL: 76 mg/dL — ABNORMAL HIGH (ref 0–40)

## 2017-06-29 LAB — PROTIME-INR
INR: 2.28
Prothrombin Time: 24.9 seconds — ABNORMAL HIGH (ref 11.4–15.2)

## 2017-06-29 LAB — TROPONIN I: Troponin I: <0.03 ng/mL (ref <0.03)

## 2017-06-29 MED ORDER — HEPARIN BOLUS VIA INFUSION
2300.0000 [IU] | Freq: Once | INTRAVENOUS | Status: AC
  Administered 2017-06-29: 2300 [IU] via INTRAVENOUS
  Filled 2017-06-29: qty 2300

## 2017-06-29 MED ORDER — INSULIN ASPART 100 UNIT/ML ~~LOC~~ SOLN
0.0000 [IU] | Freq: Three times a day (TID) | SUBCUTANEOUS | Status: DC
  Administered 2017-06-29: 1 [IU] via SUBCUTANEOUS
  Filled 2017-06-29: qty 1

## 2017-06-29 NOTE — Progress Notes (Signed)
*  PRELIMINARY RESULTS* Echocardiogram 2D Echocardiogram has been performed.  Joanette GulaJoan M Andreika Vandagriff 06/29/2017, 10:12 AM

## 2017-06-29 NOTE — Progress Notes (Signed)
Patient was transferred from the ED following c/o chest pain. On admission to the unit patient was A&O X4, endorsed chest pain which was relieved with sublingual nitro.Patient was started on heparin gtt and maintenance IV fluid. Patient was oriented to her room and care plan was reviewed with patient. Patient is kept NPO overnight per order.

## 2017-06-29 NOTE — Discharge Summary (Signed)
[facility] - [place] at [facility]   PATIENT NAME: [name]    MR#:  [id number]  DATE OF BIRTH:  [birth year]  DATE OF ADMISSION:  06/28/2017 ADMITTING PHYSICIAN: Ihor Austin, MD  DATE OF DISCHARGE: 06/29/2017   PRIMARY CARE PHYSICIAN: Rayetta Humphrey, MD    ADMISSION DIAGNOSIS:  EKG abnormalities [R94.31] Chest pain, unspecified type [R07.9]  DISCHARGE DIAGNOSIS:  Active Problems:   Chest pain   ACS ruled out,  SECONDARY DIAGNOSIS:   Past Medical History:  Diagnosis Date  . CAD (coronary artery disease)    per cardiac MRI  . Diabetes mellitus without complication (HCC)   . Hypertension   . Lupus (systemic lupus erythematosus) [facility])     HOSPITAL COURSE:   52 year old female patient with history of diabetes mellitus type 2, valve replacement on Coumadin, hypertension, lupus erythematosus presented to the emergency room with chest pain, nausea and vomiting and diarrhea.  1.  Chest pain with abnormal EKG Admitted patient to telemetry observation bed Cycle troponin to rule out ischemia. Remained negative. Start patient on aspirin Cardiology consultation appreciated, as pt had negatvie cath last year, and pain resolved- suggest to d/c today and follow tomorrow to his office for CTA coronaries. Pt agreed with plan.  2 acute gastroenteritis.  Viral in etiology IV fluids  Stool is forming up now, less frequency.  3.  Diabetes mellitus type 2 Sliding scale coverage with insulin Diabetic diet  4.  History of valve replacement IV heparin drip for anticoagulation Resume coumadin on d./c.  5.  DVT prophylaxis On heparin drip   DISCHARGE CONDITIONS:  Stable.  CONSULTS OBTAINED:  Treatment Team:  Laurier Nancy, MD  DRUG ALLERGIES:   Allergies  Allergen Reactions  . Flexeril [Cyclobenzaprine] Swelling  . Celecoxib Hives  . Gabapentin Hives  . Iodinated Diagnostic Agents Hives    Can tolerate. Night before reaction pt ate a  lot of shellfish.  . Morphine And Related Swelling  . Percocet [Oxycodone-Acetaminophen] Hives  . Skelaxin [Metaxalone] Hives  . Cataflam [Diclofenac] Hives  . Demerol [Meperidine] Hives  . Nortriptyline     lightheaded'  . Tramadol Hives  . Ketorolac Rash  . Norflex [Orphenadrine Citrate] Rash    DISCHARGE MEDICATIONS:   Allergies as of 06/29/2017      Reactions   Flexeril [cyclobenzaprine] Swelling   Celecoxib Hives   Gabapentin Hives   Iodinated Diagnostic Agents Hives   Can tolerate. Night before reaction pt ate a lot of shellfish.   Morphine And Related Swelling   Percocet [oxycodone-acetaminophen] Hives   Skelaxin [metaxalone] Hives   Cataflam [diclofenac] Hives   Demerol [meperidine] Hives   Nortriptyline    lightheaded'   Tramadol Hives   Ketorolac Rash   Norflex [orphenadrine Citrate] Rash      Medication List    STOP taking these medications   doxycycline 100 MG capsule Commonly known as:  VIBRAMYCIN   famotidine 40 MG tablet Commonly known as:  PEPCID   predniSONE 50 MG tablet Commonly known as:  DELTASONE     TAKE these medications   acetaminophen 500 MG tablet Commonly known as:  TYLENOL Take 500 mg by mouth every 6 (six) hours as needed for mild pain.   albuterol 108 (90 Base) MCG/ACT inhaler Commonly known as:  PROVENTIL HFA;VENTOLIN HFA Inhale 1-2 puffs into the lungs every 6 (six) hours as needed for wheezing or shortness of breath.   amLODipine 5 MG tablet Commonly known as:  NORVASC  Take 5 mg by mouth daily.   aspirin 81 MG tablet Take 81 mg by mouth daily.   DEPAKOTE PO Take by mouth. Take 500MG  by mouth every morning and 750MG  by mouth every evening   esomeprazole 40 MG capsule Commonly known as:  NEXIUM Take 40 mg by mouth daily at 12 noon.   furosemide 20 MG tablet Commonly known as:  LASIX Take 20 mg by mouth daily as needed for fluid or edema.   HYDROcodone-acetaminophen 5-325 MG tablet Commonly known as:   NORCO/VICODIN Take 1 tablet by mouth 4 (four) times daily.   lidocaine 5 % Commonly known as:  LIDODERM Place 1 patch onto the skin every 12 (twelve) hours. Remove & Discard patch within 12 hours or as directed by MD   metFORMIN 500 MG tablet Commonly known as:  GLUCOPHAGE Take 500 mg by mouth 2 (two) times daily with a meal.   methocarbamol 500 MG tablet Commonly known as:  ROBAXIN Take 1 tablet (500 mg total) by mouth 2 (two) times daily.   metoprolol tartrate 25 MG tablet Commonly known as:  LOPRESSOR Take 25 mg by mouth 2 (two) times daily.   OLANZapine 5 MG tablet Commonly known as:  ZYPREXA Take 5 mg by mouth at bedtime.   potassium chloride SA 20 MEQ tablet Commonly known as:  K-DUR,KLOR-CON Take 20 mEq by mouth daily.   pravastatin 40 MG tablet Commonly known as:  PRAVACHOL Take 40 mg by mouth at bedtime.   sucralfate 1 g tablet Commonly known as:  CARAFATE Take 1 tablet (1 g total) by mouth 4 (four) times daily.   SYMBICORT 160-4.5 MCG/ACT inhaler Generic drug:  budesonide-formoterol Inhale 2 puffs into the lungs 2 (two) times daily.   warfarin 5 MG tablet Commonly known as:  COUMADIN Take 2MG  by mouth every evening Thursday to Tuesday and 5MG  by mouth on Wednesday evening        DISCHARGE INSTRUCTIONS:    Follow with Cardio clinic in 1 day.  If you experience worsening of your admission symptoms, develop shortness of breath, life threatening emergency, suicidal or homicidal thoughts you must seek medical attention immediately by calling 911 or calling your MD immediately  if symptoms less severe.  You Must read complete instructions/literature along with all the possible adverse reactions/side effects for all the Medicines you take and that have been prescribed to you. Take any new Medicines after you have completely understood and accept all the possible adverse reactions/side effects.   Please note  You were cared for by a hospitalist during your  hospital stay. If you have any questions about your discharge medications or the care you received while you were in the hospital after you are discharged, you can call the unit and asked to speak with the hospitalist on call if the hospitalist that took care of you is not available. Once you are discharged, your primary care physician will handle any further medical issues. Please note that NO REFILLS for any discharge medications will be authorized once you are discharged, as it is imperative that you return to your primary care physician (or establish a relationship with a primary care physician if you do not have one) for your aftercare needs so that they can reassess your need for medications and monitor your lab values.    Today   CHIEF COMPLAINT:   Chief Complaint  Patient presents with  . Chest Pain  . Nausea    HISTORY OF PRESENT ILLNESS:  Sherrilyn RistKari  Robbs  is a 52 y.o. female with a known history of coronary artery disease, diabetes mellitus type 2, hypertension, lupus erythematosus, valve replacement on Coumadin presented to the emergency room with nausea vomiting diarrhea and chest pain.  The diarrhea was watery stool.  The chest pain was located in the retrosternal area was sharp in nature 5 out of 10 on a scale of 1-10.  EKG showed T wave inversions in the inferior leads.  First set of troponin is negative patient is on Coumadin for anticoagulation as outpatient.  Fever and chills.  No complaints of any abdominal pain.  Hospitalist service was consulted for further care.   VITAL SIGNS:  Blood pressure 112/64, pulse 77, temperature 97.6 F (36.4 C), temperature source Oral, resp. rate 18, height 4\' 11"  (1.499 m), weight 120.2 kg (265 lb), SpO2 99 %.  I/O:    Intake/Output Summary (Last 24 hours) at 06/29/2017 1105 Last data filed at 06/29/2017 1051 Gross per 24 hour  Intake 590.58 ml  Output 1000 ml  Net -409.42 ml    PHYSICAL EXAMINATION:  GENERAL:  52 y.o.-year-old patient  lying in the bed with no acute distress.  EYES: Pupils equal, round, reactive to light and accommodation. No scleral icterus. Extraocular muscles intact.  HEENT: Head atraumatic, normocephalic. Oropharynx and nasopharynx clear.  NECK:  Supple, no jugular venous distention. No thyroid enlargement, no tenderness.  LUNGS: Normal breath sounds bilaterally, no wheezing, rales,rhonchi or crepitation. No use of accessory muscles of respiration.  CARDIOVASCULAR: S1, S2 normal. No murmurs, rubs, or gallops.  ABDOMEN: Soft, non-tender, non-distended. Bowel sounds present. No organomegaly or mass.  EXTREMITIES: No pedal edema, cyanosis, or clubbing.  NEUROLOGIC: Cranial nerves II through XII are intact. Muscle strength 5/5 in all extremities. Sensation intact. Gait not checked.  PSYCHIATRIC: The patient is alert and oriented x 3.  SKIN: No obvious rash, lesion, or ulcer.   DATA REVIEW:   CBC Recent Labs  Lab 06/29/17 0857  WBC 6.3  HGB 9.3*  HCT 31.9*  PLT 200    Chemistries  Recent Labs  Lab 06/29/17 0857  NA 141  K 4.1  CL 106  CO2 28  GLUCOSE 92  BUN 14  CREATININE 0.86  CALCIUM 8.2*    Cardiac Enzymes Recent Labs  Lab 06/29/17 0857  TROPONINI <0.03    Microbiology Results  Results for orders placed or performed during the hospital encounter of 05/14/15  C difficile quick scan w PCR reflex     Status: None   Collection Time: 05/14/15  4:57 PM  Result Value Ref Range Status   C Diff antigen NEGATIVE NEGATIVE Final   C Diff toxin NEGATIVE NEGATIVE Final   C Diff interpretation Negative for C. difficile  Final  Gastrointestinal Panel by PCR , Stool     Status: Abnormal   Collection Time: 05/14/15  4:57 PM  Result Value Ref Range Status   Campylobacter species NOT DETECTED NOT DETECTED Final   Plesimonas shigelloides NOT DETECTED NOT DETECTED Final   Salmonella species NOT DETECTED NOT DETECTED Final   Yersinia enterocolitica NOT DETECTED NOT DETECTED Final   Vibrio  species NOT DETECTED NOT DETECTED Final   Vibrio cholerae NOT DETECTED NOT DETECTED Final   Enteroaggregative E coli (EAEC) NOT DETECTED NOT DETECTED Final   Enteropathogenic E coli (EPEC) NOT DETECTED NOT DETECTED Final   Enterotoxigenic E coli (ETEC) NOT DETECTED NOT DETECTED Final   Shiga like toxin producing E coli (STEC) NOT DETECTED NOT DETECTED Final  E. coli O157 NOT DETECTED NOT DETECTED Final   Shigella/Enteroinvasive E coli (EIEC) NOT DETECTED NOT DETECTED Final   Cryptosporidium NOT DETECTED NOT DETECTED Final   Cyclospora cayetanensis NOT DETECTED NOT DETECTED Final   Entamoeba histolytica NOT DETECTED NOT DETECTED Final   Giardia lamblia NOT DETECTED NOT DETECTED Final   Adenovirus F40/41 NOT DETECTED NOT DETECTED Final   Astrovirus NOT DETECTED NOT DETECTED Final   Norovirus GI/GII DETECTED (A) NOT DETECTED Final    Comment: CRITICAL RESULT CALLED TO, READ BACK BY AND VERIFIED WITH: GRACE CINDRIC AT 1755 05/14/15 SDR    Rotavirus A NOT DETECTED NOT DETECTED Final   Sapovirus (I, II, IV, and V) NOT DETECTED NOT DETECTED Final    RADIOLOGY:  Dg Chest 2 View  Result Date: 06/28/2017 CLINICAL DATA:  Tachycardia and chest pressure for a few days. Nausea, vomiting and diarrhea beginning today. Suspected dehydration. History of lupus, hypertension and diabetes. EXAM: CHEST - 2 VIEW COMPARISON:  Chest radiograph June 03, 2017 FINDINGS: Cardiomediastinal silhouette is normal. Status post median sternotomy for cardiac valve replacement. No pleural effusions or focal consolidations. Trachea projects midline and there is no pneumothorax. Soft tissue planes and included osseous structures are non-suspicious. Surgical clips in the included right abdomen compatible with cholecystectomy. Large body habitus. Mild degenerative change of the thoracic spine. IMPRESSION: No active cardiopulmonary disease. Electronically Signed   By: Awilda Metro M.D.   On: 06/28/2017 17:27    EKG:    Orders placed or performed during the hospital encounter of 06/28/17  . EKG 12-Lead  . EKG 12-Lead  . ED EKG within 10 minutes  . ED EKG within 10 minutes      Management plans discussed with the patient, family and they are in agreement.  CODE STATUS:     Code Status Orders  (From admission, onward)        Start     Ordered   06/28/17 2050  Full code  Continuous     06/28/17 2049    Code Status History    Date Active Date Inactive Code Status Order ID Comments User Context   04/21/2016 0555 04/21/2016 1846 Full Code [id number]  Arnaldo Natal, MD Inpatient      TOTAL TIME TAKING CARE OF THIS PATIENT: 35 minutes.    Altamese Dilling M.D on 06/29/2017 at 11:05 AM  Between 7am to 6pm - Pager - [phone]  After 6pm go to [email or web address] - password [email or web address]  [facility]  Office  [phone]  CC: Primary care physician; Rayetta Humphrey, MD   Note: This dictation was prepared with Dragon dictation along with smaller phrase technology. Any transcriptional errors that result from this process are unintentional.

## 2017-06-29 NOTE — Plan of Care (Signed)
  Problem: Clinical Measurements: Goal: Will remain free from infection Outcome: Progressing Note:  Remains afebrile Goal: Diagnostic test results will improve Outcome: Progressing Note:  Troponins .03x3, PT 2709/2.63   Problem: Pain Managment: Goal: General experience of comfort will improve Outcome: Progressing Note:  No chest pain at this morning, pain meds prn   Problem: Cardiac: Goal: Ability to achieve and maintain adequate cardiovascular perfusion will improve Outcome: Progressing Note:  No further CP at this time, troponins negative

## 2017-06-29 NOTE — Consult Note (Signed)
ANTICOAGULATION CONSULT NOTE - Initial Consult  Pharmacy Consult for Heparin  Indication: chest pain/ACS  Allergies  Allergen Reactions  . Flexeril [Cyclobenzaprine] Swelling  . Celecoxib Hives  . Gabapentin Hives  . Iodinated Diagnostic Agents Hives    Can tolerate. Night before reaction pt ate a lot of shellfish.  . Morphine And Related Swelling  . Percocet [Oxycodone-Acetaminophen] Hives  . Skelaxin [Metaxalone] Hives  . Cataflam [Diclofenac] Hives  . Demerol [Meperidine] Hives  . Nortriptyline     lightheaded'  . Tramadol Hives  . Ketorolac Rash  . Norflex [Orphenadrine Citrate] Rash    Patient Measurements: Height: 4\' 11"  (149.9 cm) Weight: 265 lb (120.2 kg) IBW/kg (Calculated) : 43.2 Heparin Dosing Weight: 76kg   Vital Signs: Temp: 98.6 F (37 C) (04/04 0405) Temp Source: Oral (04/04 0405) BP: 114/64 (04/04 0405) Pulse Rate: 83 (04/04 0405)  Labs: Recent Labs    06/28/17 1629 06/28/17 2052 06/29/17 0249  HGB 10.3*  --   --   HCT 34.2*  --   --   PLT 240  --   --   APTT  --  48*  --   LABPROT  --  27.9*  --   INR  --  2.63  --   HEPARINUNFRC  --   --  <0.10*  CREATININE 0.86  --   --   TROPONINI <0.03 <0.03 <0.03    Estimated Creatinine Clearance: 92.4 mL/min (by C-G formula based on SCr of 0.86 mg/dL).   Medical History: Past Medical History:  Diagnosis Date  . CAD (coronary artery disease)    per cardiac MRI  . Diabetes mellitus without complication (HCC)   . Hypertension   . Lupus (systemic lupus erythematosus) (HCC)    Assessment: Pharmacy consulted for heparin drip dosing and monitoring in 52 yo female admitted with chest pain. Patient has PMH of A.Fib and was taking Warfarin PTA.   Goal of Therapy:  Heparin level 0.3-0.7 units/ml Monitor platelets by anticoagulation protocol: Yes   Plan:  Baseline labs ordered  Give 4000 units bolus x 1 Start heparin infusion at 950 units/hr Check anti-Xa level in 6 hours and daily while on  heparin Continue to monitor H&H and platelets   4/4 AM heparin level <0.1. 2300 unit bolus and increase rate to 1250 units/hr. Recheck in 6 hours.  Erich MontaneMcBane,Gadiel John S, PharmD, BCPS Clinical Pharmacist 06/29/2017 5:21 AM

## 2017-06-29 NOTE — Progress Notes (Signed)
Pt discharged to home via wc.  Instructions  to pt.  Questions answered.  No distress.  

## 2017-06-29 NOTE — Consult Note (Signed)
[name] is a 52 y.o. female  [id number]  Primary Cardiologist: Adrian Blackwater Reason for Consultation: chest pain  HPI: this is a 52 year old white female status post aortic valve replacement at [facility] presented to the hospital with chest pain and has history of lupus. Patient's chest pain has mostly subsided and had normal coronaries prior to aortic valve replacement a year ago at [facility].   Review of Systems: patient has some shortness of breath   Past Medical History:  Diagnosis Date  . CAD (coronary artery disease)    per cardiac MRI  . Diabetes mellitus without complication (HCC)   . Hypertension   . Lupus (systemic lupus erythematosus) (HCC)     Medications Prior to Admission  Medication Sig Dispense Refill  . acetaminophen (TYLENOL) 500 MG tablet Take 500 mg by mouth every 6 (six) hours as needed for mild pain.     Marland Kitchen albuterol (PROVENTIL HFA;VENTOLIN HFA) 108 (90 Base) MCG/ACT inhaler Inhale 1-2 puffs into the lungs every 6 (six) hours as needed for wheezing or shortness of breath.    Marland Kitchen amLODipine (NORVASC) 5 MG tablet Take 5 mg by mouth daily.    Marland Kitchen aspirin 81 MG tablet Take 81 mg by mouth daily.    . budesonide-formoterol (SYMBICORT) 160-4.5 MCG/ACT inhaler Inhale 2 puffs into the lungs 2 (two) times daily.    . Divalproex Sodium (DEPAKOTE PO) Take by mouth. Take 500MG  by mouth every morning and 750MG  by mouth every evening    . esomeprazole (NEXIUM) 40 MG capsule Take 40 mg by mouth daily at 12 noon.    . furosemide (LASIX) 20 MG tablet Take 20 mg by mouth daily as needed for fluid or edema.     Marland Kitchen HYDROcodone-acetaminophen (NORCO/VICODIN) 5-325 MG tablet Take 1 tablet by mouth 4 (four) times daily.     . metFORMIN (GLUCOPHAGE) 500 MG tablet Take 500 mg by mouth 2 (two) times daily with a meal.     . metoprolol tartrate (LOPRESSOR) 25 MG tablet Take 25 mg by mouth 2 (two) times daily.    Marland Kitchen OLANZapine (ZYPREXA) 5 MG tablet Take 5 mg by mouth at bedtime.    . potassium  chloride SA (K-DUR,KLOR-CON) 20 MEQ tablet Take 20 mEq by mouth daily.     . pravastatin (PRAVACHOL) 40 MG tablet Take 40 mg by mouth at bedtime.     . sucralfate (CARAFATE) 1 g tablet Take 1 tablet (1 g total) by mouth 4 (four) times daily. 60 tablet 0  . warfarin (COUMADIN) 5 MG tablet Take 2MG  by mouth every evening Thursday to Tuesday and 5MG  by mouth on Wednesday evening    . doxycycline (VIBRAMYCIN) 100 MG capsule Take 1 capsule (100 mg total) by mouth 2 (two) times daily. (Patient not taking: Reported on 06/28/2017) 14 capsule 0  . famotidine (PEPCID) 40 MG tablet Take 1 tablet (40 mg total) by mouth every evening. (Patient not taking: Reported on 06/28/2017) 30 tablet 1  . lidocaine (LIDODERM) 5 % Place 1 patch onto the skin every 12 (twelve) hours. Remove & Discard patch within 12 hours or as directed by MD (Patient not taking: Reported on 06/28/2017) 10 patch 0  . methocarbamol (ROBAXIN) 500 MG tablet Take 1 tablet (500 mg total) by mouth 2 (two) times daily. (Patient not taking: Reported on 06/28/2017) 20 tablet 0  . predniSONE (DELTASONE) 50 MG tablet 50 mg daily for 5 days. (Patient not taking: Reported on 06/28/2017) 5 tablet 0     .  amLODipine  5 mg Oral Daily  . aspirin EC  81 mg Oral Daily  . atorvastatin  40 mg Oral q1800  . divalproex  500 mg Oral Q8H  . metoprolol tartrate  25 mg Oral BID  . mometasone-formoterol  2 puff Inhalation BID  . OLANZapine  5 mg Oral QHS  . pantoprazole  40 mg Oral Daily  . pravastatin  40 mg Oral QHS  . sucralfate  1 g Oral QID    Infusions: . sodium chloride 75 mL/hr at 06/28/17 2157  . heparin 1,250 Units/hr (06/29/17 0556)    Allergies  Allergen Reactions  . Flexeril [Cyclobenzaprine] Swelling  . Celecoxib Hives  . Gabapentin Hives  . Iodinated Diagnostic Agents Hives    Can tolerate. Night before reaction pt ate a lot of shellfish.  . Morphine And Related Swelling  . Percocet [Oxycodone-Acetaminophen] Hives  . Skelaxin [Metaxalone] Hives   . Cataflam [Diclofenac] Hives  . Demerol [Meperidine] Hives  . Nortriptyline     lightheaded'  . Tramadol Hives  . Ketorolac Rash  . Norflex [Orphenadrine Citrate] Rash    Social History   Socioeconomic History  . Marital status: Single    Spouse name: Not on file  . Number of children: Not on file  . Years of education: Not on file  . Highest education level: Not on file  Occupational History    Employer: [employer]  Social Needs  . Financial resource strain: Not on file  . Food insecurity:    Worry: Not on file    Inability: Not on file  . Transportation needs:    Medical: Not on file    Non-medical: Not on file  Tobacco Use  . Smoking status: Former Smoker    Packs/day: 0.50    Years: 15.00    Pack years: 7.50    Types: Cigarettes    Last attempt to quit: 06/26/2016    Years since quitting: 1.0  . Smokeless tobacco: Never Used  Substance and Sexual Activity  . Alcohol use: No  . Drug use: No  . Sexual activity: Not on file  Lifestyle  . Physical activity:    Days per week: Not on file    Minutes per session: Not on file  . Stress: Not on file  Relationships  . Social connections:    Talks on phone: Not on file    Gets together: Not on file    Attends religious service: Not on file    Active member of club or organization: Not on file    Attends meetings of clubs or organizations: Not on file    Relationship status: Not on file  . Intimate partner violence:    Fear of current or ex partner: Not on file    Emotionally abused: Not on file    Physically abused: Not on file    Forced sexual activity: Not on file  Other Topics Concern  . Not on file  Social History Narrative  . Not on file    Family History  Problem Relation Age of Onset  . Hyperlipidemia Mother   . Hypertension Father   . Diabetes Father   . Hyperlipidemia Father     PHYSICAL EXAM: Vitals:   06/29/17 0405 06/29/17 0727  BP: 114/64 112/64  Pulse: 83 77  Resp:  18  Temp: 98.6 F  (37 C) 97.6 F (36.4 C)  SpO2: 98% 99%     Intake/Output Summary (Last 24 hours) at 06/29/2017 1610 Last data  filed at 06/29/2017 0701 Gross per 24 hour  Intake 242.66 ml  Output 1000 ml  Net -757.34 ml    General:  Well appearing. No respiratory difficulty HEENT: normal Neck: supple. no JVD. Carotids 2+ bilat; no bruits. No lymphadenopathy or thryomegaly appreciated. Cor: PMI nondisplaced. Regular rate & rhythm. No rubs, gallops or murmurs. Lungs: clear Abdomen: soft, nontender, nondistended. No hepatosplenomegaly. No bruits or masses. Good bowel sounds. Extremities: no cyanosis, clubbing, rash, edema Neuro: alert & oriented x 3, cranial nerves grossly intact. moves all 4 extremities w/o difficulty. Affect pleasant.  ECG: normal sinus rhythm left bundle branch block  Results for orders placed or performed during the hospital encounter of 06/28/17 (from the past 24 hour(s))  Basic metabolic panel     Status: Abnormal   Collection Time: 06/28/17  4:29 PM  Result Value Ref Range   Sodium 142 135 - 145 mmol/L   Potassium 4.4 3.5 - 5.1 mmol/L   Chloride 106 101 - 111 mmol/L   CO2 29 22 - 32 mmol/L   Glucose, Bld 102 (H) 65 - 99 mg/dL   BUN 13 6 - 20 mg/dL   Creatinine, Ser 4.09 0.44 - 1.00 mg/dL   Calcium 8.9 8.9 - 81.1 mg/dL   GFR calc non Af Amer >60 >60 mL/min   GFR calc Af Amer >60 >60 mL/min   Anion gap 7 5 - 15  CBC     Status: Abnormal   Collection Time: 06/28/17  4:29 PM  Result Value Ref Range   WBC 8.3 3.6 - 11.0 K/uL   RBC 5.02 3.80 - 5.20 MIL/uL   Hemoglobin 10.3 (L) 12.0 - 16.0 g/dL   HCT 91.4 (L) 78.2 - 95.6 %   MCV 68.2 (L) 80.0 - 100.0 fL   MCH 20.6 (L) 26.0 - 34.0 pg   MCHC 30.2 (L) 32.0 - 36.0 g/dL   RDW 21.3 (H) 08.6 - 57.8 %   Platelets 240 150 - 440 K/uL  Troponin I     Status: None   Collection Time: 06/28/17  4:29 PM  Result Value Ref Range   Troponin I <0.03 <0.03 ng/mL  APTT     Status: Abnormal   Collection Time: 06/28/17  8:52 PM  Result  Value Ref Range   aPTT 48 (H) 24 - 36 seconds  Protime-INR     Status: Abnormal   Collection Time: 06/28/17  8:52 PM  Result Value Ref Range   Prothrombin Time 27.9 (H) 11.4 - 15.2 seconds   INR 2.63   Troponin I     Status: None   Collection Time: 06/28/17  8:52 PM  Result Value Ref Range   Troponin I <0.03 <0.03 ng/mL  Troponin I     Status: None   Collection Time: 06/29/17  2:49 AM  Result Value Ref Range   Troponin I <0.03 <0.03 ng/mL  Heparin level (unfractionated)     Status: Abnormal   Collection Time: 06/29/17  2:49 AM  Result Value Ref Range   Heparin Unfractionated <0.10 (L) 0.30 - 0.70 IU/mL   Dg Chest 2 View  Result Date: 06/28/2017 CLINICAL DATA:  Tachycardia and chest pressure for a few days. Nausea, vomiting and diarrhea beginning today. Suspected dehydration. History of lupus, hypertension and diabetes. EXAM: CHEST - 2 VIEW COMPARISON:  Chest radiograph June 03, 2017 FINDINGS: Cardiomediastinal silhouette is normal. Status post median sternotomy for cardiac valve replacement. No pleural effusions or focal consolidations. Trachea projects midline and there is no  pneumothorax. Soft tissue planes and included osseous structures are non-suspicious. Surgical clips in the included right abdomen compatible with cholecystectomy. Large body habitus. Mild degenerative change of the thoracic spine. IMPRESSION: No active cardiopulmonary disease. Electronically Signed   By: Awilda Metroourtnay  Bloomer M.D.   On: 06/28/2017 17:27     ASSESSMENT AND PLAN: atypical chest pain with MI being ruled out and history of left bundle-branch block will advise doing CTA coronaries even though had normal coronaries a year ago prior to aortic valve replacement. Her INR is 2.6 and would not be a candidate for catheterization anyway and the since BUN and creatinine are normal advise proceeding with CTA coronaries and she can be discharged with follow-up in the office 9 AM tomorrow.  KHAN,SHAUKAT A

## 2017-06-29 NOTE — Progress Notes (Signed)
Patient is chedualed for CTA coronaries tomorrow morning at 9 AM in my office [facility].

## 2017-06-29 NOTE — Plan of Care (Signed)
  Problem: Education: Goal: Knowledge of General Education information will improve Outcome: Progressing   Problem: Health Behavior/Discharge Planning: Goal: Ability to manage health-related needs will improve Outcome: Progressing   Problem: Clinical Measurements: Goal: Ability to maintain clinical measurements within normal limits will improve Outcome: Progressing Goal: Will remain free from infection Outcome: Progressing Goal: Diagnostic test results will improve Outcome: Progressing Goal: Respiratory complications will improve Outcome: Progressing Goal: Cardiovascular complication will be avoided Outcome: Progressing   Problem: Activity: Goal: Risk for activity intolerance will decrease Outcome: Progressing   Problem: Nutrition: Goal: Adequate nutrition will be maintained Outcome: Progressing   Problem: Coping: Goal: Level of anxiety will decrease Outcome: Progressing   Problem: Elimination: Goal: Will not experience complications related to bowel motility Outcome: Progressing Goal: Will not experience complications related to urinary retention Outcome: Progressing   Problem: Pain Managment: Goal: General experience of comfort will improve Outcome: Progressing   Problem: Safety: Goal: Ability to remain free from injury will improve Outcome: Progressing   Problem: Skin Integrity: Goal: Risk for impaired skin integrity will decrease Outcome: Progressing   Problem: Activity: Goal: Ability to tolerate increased activity will improve Outcome: Progressing   Problem: Cardiac: Goal: Ability to achieve and maintain adequate cardiovascular perfusion will improve Outcome: Progressing   Problem: Health Behavior/Discharge Planning: Goal: Ability to safely manage health-related needs after discharge will improve Outcome: Progressing

## 2018-01-28 IMAGING — CR DG LUMBAR SPINE COMPLETE 4+V
5 series · 5 of 5 positions shown · non-contrast
Comparison: Abdomen pelvis CT dated 05/14/2015.

CLINICAL DATA: Back pain after falling down 6 concrete steps today.

EXAM:
LUMBAR SPINE - COMPLETE 4+ VIEW

[l-spine ap]
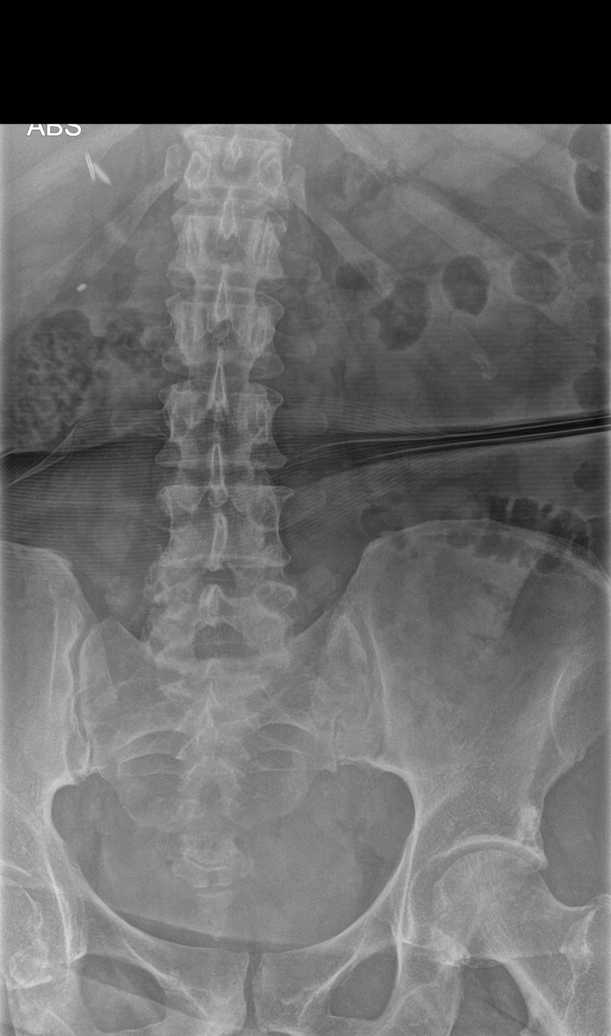

[l-spine obl (1 of 2)]
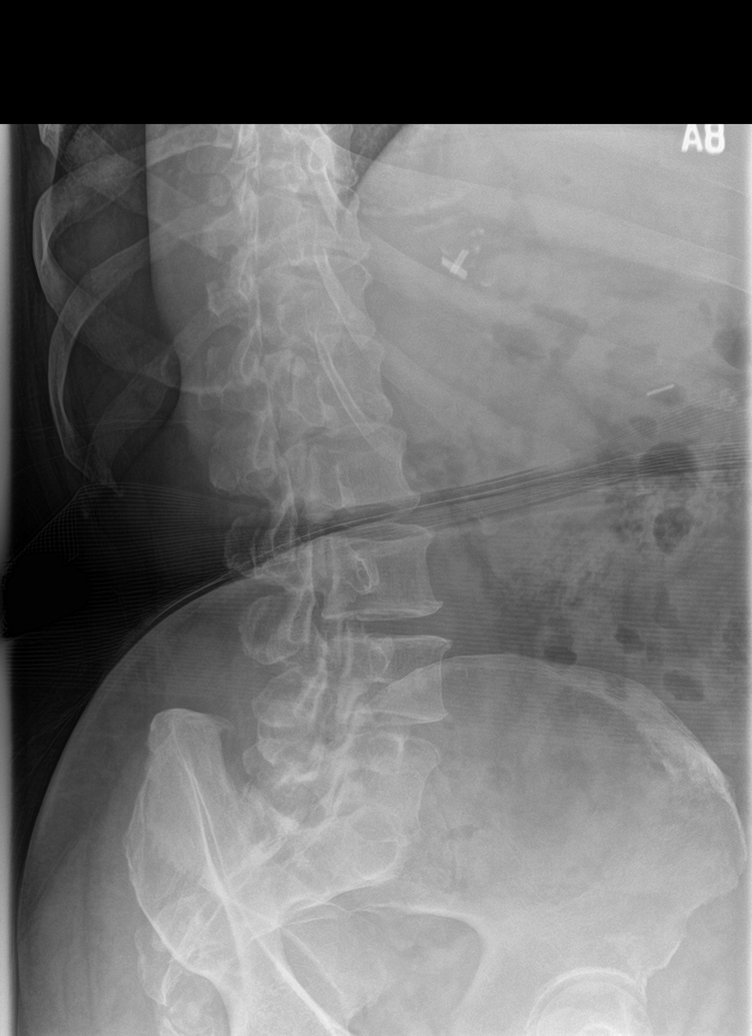

[l-spine obl (2 of 2)]
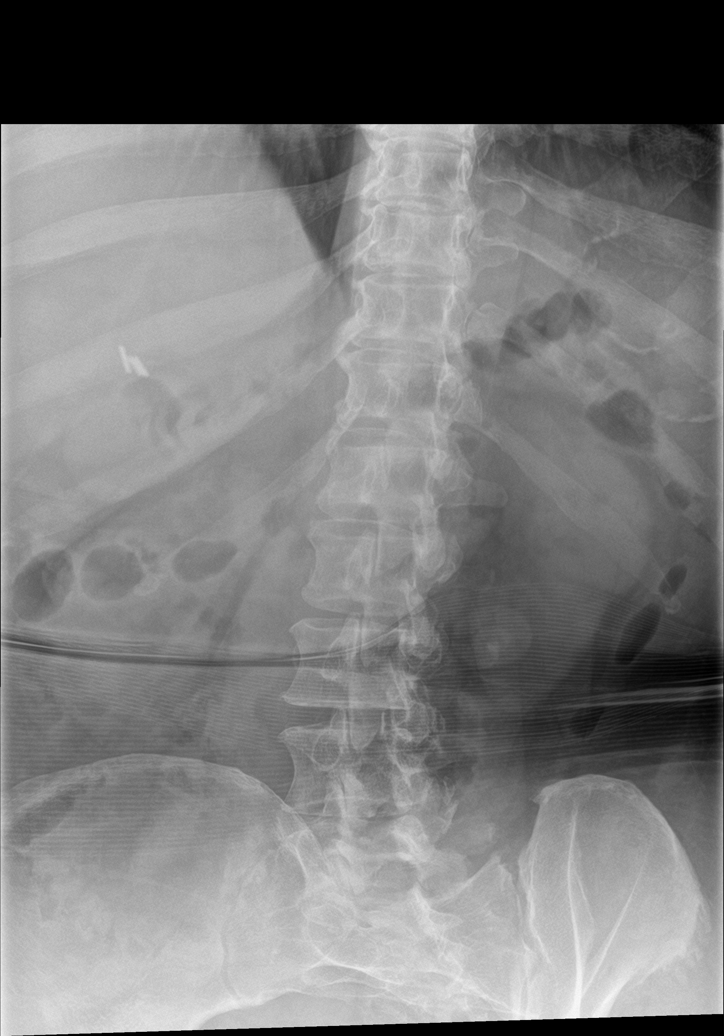

[l-spine lat]
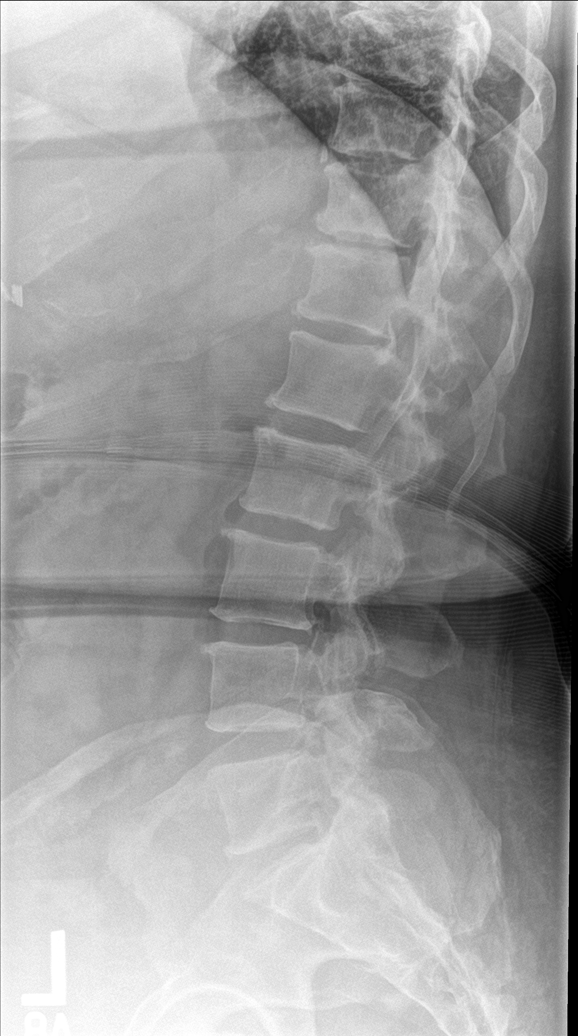

[l-spine spot]
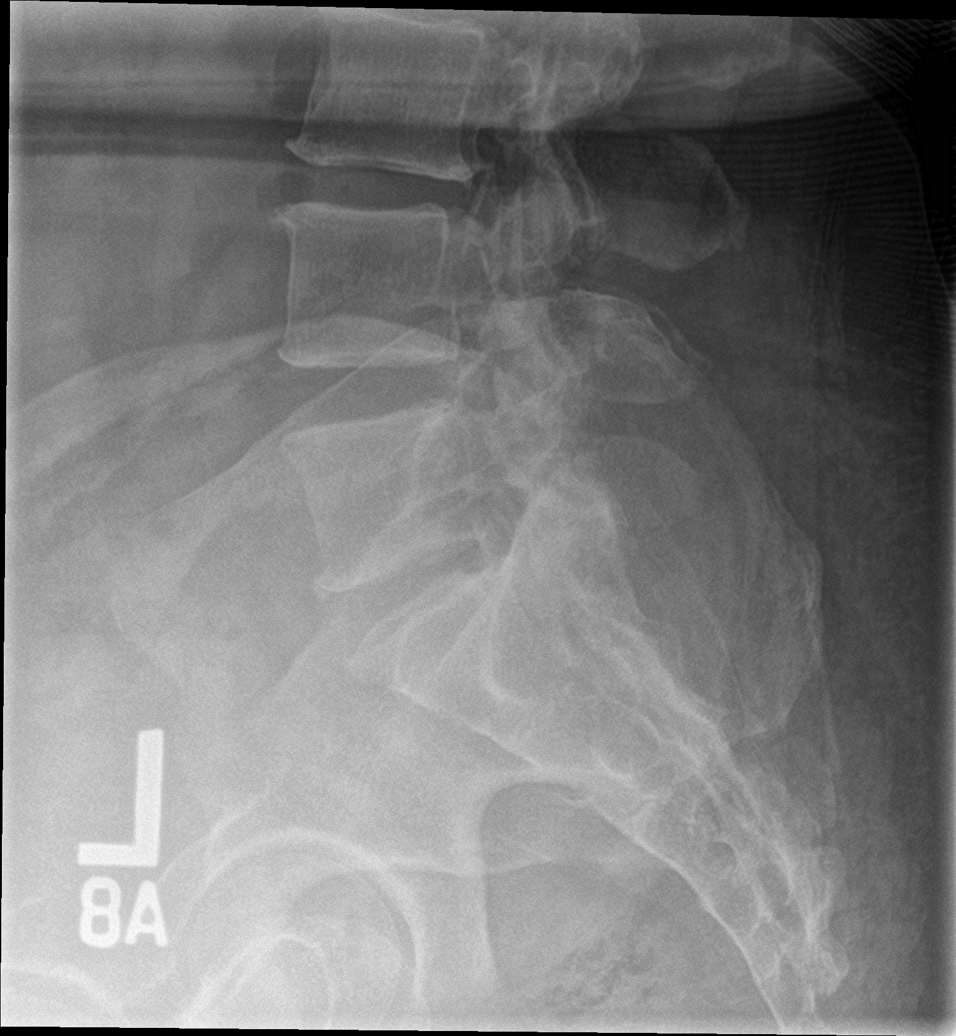

[5 of 5 positions shown; findings below may reference images not displayed]

FINDINGS: Five non-rib-bearing lumbar vertebrae. Minimal dextroconvex lumbar
scoliosis. Mild anterior spur formation throughout the lumbar spine.
No fractures, pars defects or subluxations. Cholecystectomy clips.
IMPRESSION: 1. No fracture or subluxation.
2. Mild multilevel degenerative changes.

## 2018-01-28 IMAGING — CR DG THORACIC SPINE 2V
2 series · 2 of 2 positions shown · non-contrast
Comparison: Chest radiographs obtained today.

CLINICAL DATA: Back pain after falling down 6 concrete steps today.

EXAM:
THORACIC SPINE 2 VIEWS

[t-spine ap]
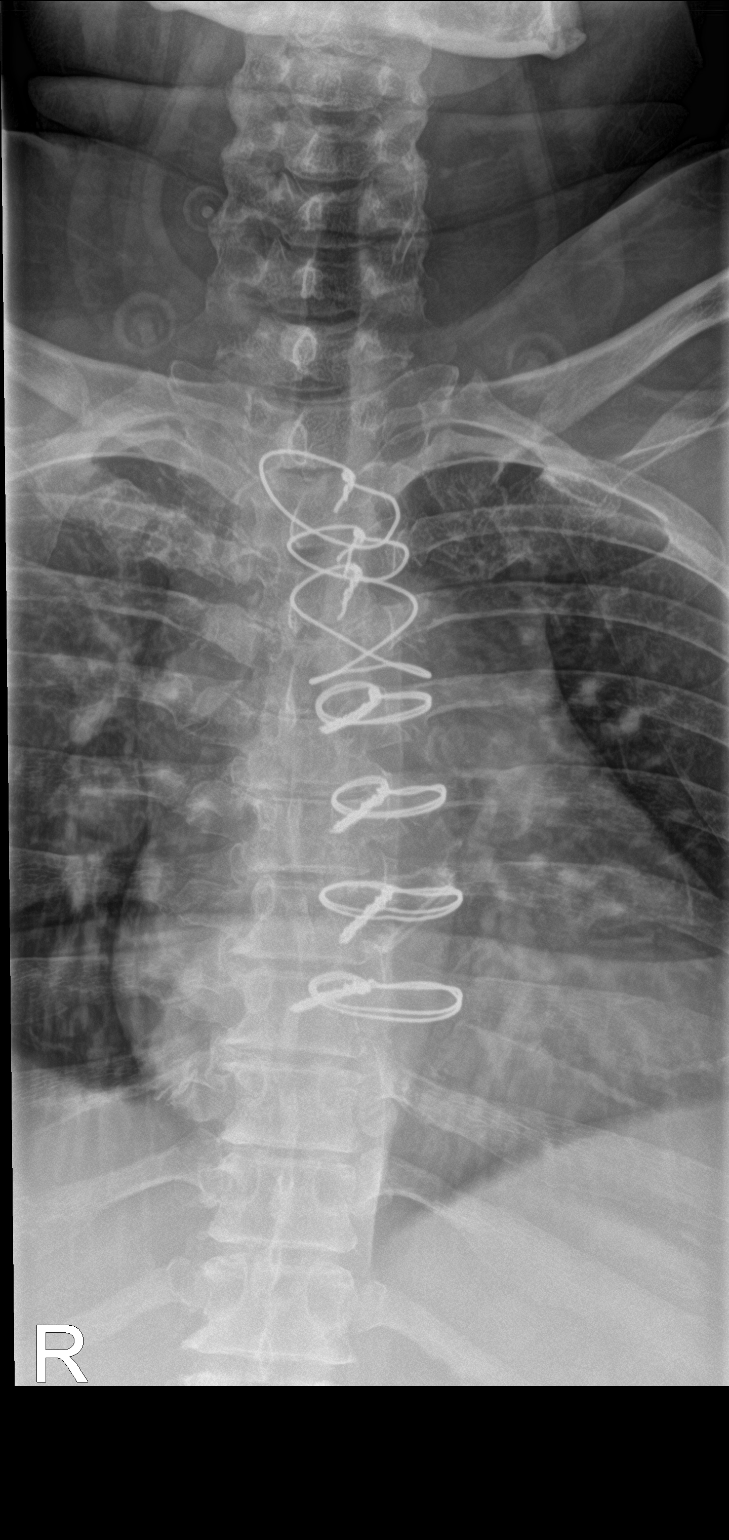

[t-spine lat]
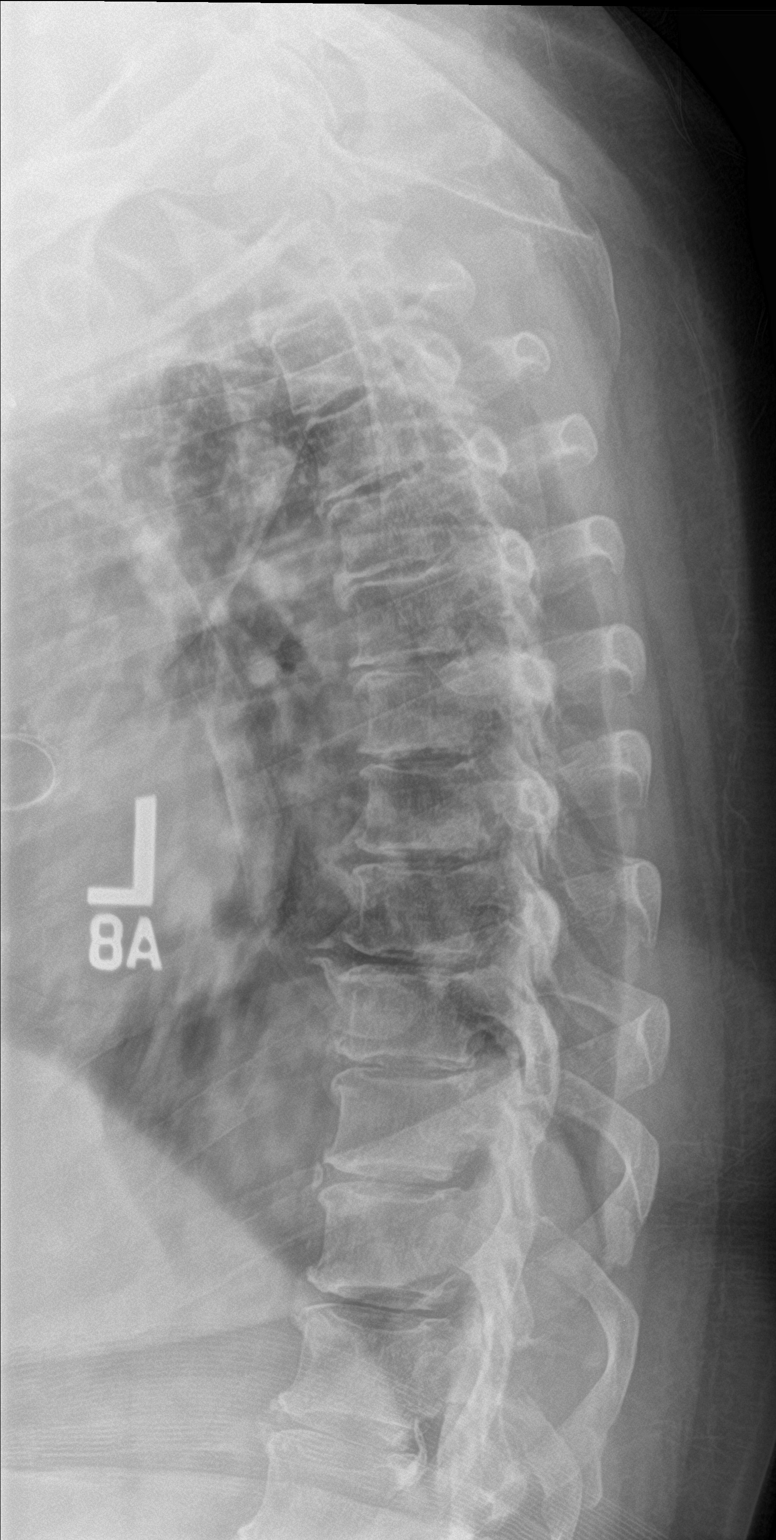

[2 of 2 positions shown; findings below may reference images not displayed]

FINDINGS: Anterior and lateral spur formation throughout the majority of the
thoracic spine. Minimal dextroconvex scoliosis. No fractures or
subluxations.
IMPRESSION: No fracture or subluxation.  Multilevel degenerative changes.

## 2019-04-15 ENCOUNTER — Other Ambulatory Visit: Payer: Self-pay

## 2019-04-15 ENCOUNTER — Ambulatory Visit
Admission: EM | Admit: 2019-04-15 | Discharge: 2019-04-15 | Disposition: A | Discharge status: Home / Self Care | Payer: Medicaid Other | Attending: Emergency Medicine | Admitting: Emergency Medicine

## 2019-04-15 DIAGNOSIS — J029 Acute pharyngitis, unspecified: Secondary | ICD-10-CM | POA: Diagnosis not present

## 2019-04-15 DIAGNOSIS — B370 Candidal stomatitis: Secondary | ICD-10-CM | POA: Diagnosis not present

## 2019-04-15 LAB — GROUP A STREP BY PCR: Group A Strep by PCR: NOT DETECTED

## 2019-04-15 MED ORDER — MAGIC MOUTHWASH: 10.0000 mL | Freq: Four times a day (QID) | ORAL | 0 refills | Status: AC

## 2019-04-15 MED ORDER — AMOXICILLIN 500 MG PO CAPS: 500.0000 mg | ORAL_CAPSULE | Freq: Two times a day (BID) | ORAL | 0 refills | Status: AC

## 2019-04-15 NOTE — Discharge Instructions (Addendum)
It was very nice seeing you today in clinic. Thank you for entrusting me with your care.   Rest and stay HYDRATED. Water and electrolyte containing beverages (Gatorade, Pedialyte) are best to prevent dehydration and electrolyte abnormalities.  Recommend warm salt water gargles, hard candies/lozenges, and hot tea with honey/lemon to help soothe the throat and reduce irritation.  May use Tylenol and/or Ibuprofen as needed for pain/fever.  Please utilize the medications that we discussed. Your prescriptions has been called in to your pharmacy.  Make arrangements to follow up with your regular doctor in 1 week for re-evaluation if not improving. If your symptoms/condition worsens, please seek follow up care either here or in the ER. Please remember, our [facility] providers are "right here with you" when you need [place].   Again, it was my pleasure to take care of you today. Thank you for choosing our clinic. I hope that you start to feel better quickly.   Quentin Mulling, MSN, APRN, FNP-C, CEN Advanced Practice Provider [facility] [place] [facility]

## 2019-04-15 NOTE — ED Triage Notes (Addendum)
Pt reports "I think I have thrush" 2 days of tongue whitish and sore, also sore throat when swallowing. Throat reddened, no fever

## 2019-04-15 NOTE — ED Provider Notes (Signed)
[place], [place]   Name: [name] DOB: Mar 17, 1966 MRN: [id number] CSN: [id number] PCP: Sharyne Peach, MD  Arrival date and time:  04/15/19 0914  Chief Complaint:  Tongue Sore and Sore Throat   NOTE: Prior to seeing the patient today, I have reviewed the triage nursing documentation and vital signs. Clinical staff has updated patient's PMH/PSHx, current medication list, and drug allergies/intolerances to ensure comprehensive history available to assist in medical decision making.   History:   HPI: [name] is a 54 y.o. female who presents today with complaints of a 2-3 day history of sore throat and sore tongue. Patient reporting painful swallowing and a white coating on her tongue.  Patient uses MDIs for her COPD, however she notes that she rinses her mouth with Listerine, in addition to wiping the mouth piece with alcohol pad, after each use. She denies dysphagia, shortness of breath, and stridor. Patient has had any fevers or chills. She denies that she has experienced any nausea, vomiting, diarrhea, or abdominal pain. She is eating and drinking well. Patient denies any perceived alterations to her sense of taste or smell. Patient denies being in close contact with anyone known to be ill. She has not been tested for SARS-CoV-2 (novel coronavirus) in the past 14 days; last tested negative in December per her report. Patient does not wish to be re-tested for the virus today in clinic. Patient has received her annual influenza vaccination this year. In efforts to conservatively manage her symptoms at home, the patient notes that she has used Chloraseptic spray, which has not helped to improve her symptoms.   Past Medical History:  Diagnosis Date  . CAD (coronary artery disease)    per cardiac MRI  . Diabetes mellitus without complication ([place])   . Hypertension   . Lupus (systemic lupus erythematosus) ([place])     Past Surgical History:  Procedure Laterality Date  . APPENDECTOMY    .  CESAREAN SECTION    . KNEE ARTHROSCOPY    . TONSILLECTOMY    . VALVE REPLACEMENT      Family History  Problem Relation Age of Onset  . Hyperlipidemia Mother   . Hypertension Father   . Diabetes Father   . Hyperlipidemia Father     Social History   Tobacco Use  . Smoking status: Former Smoker    Packs/day: 0.50    Years: 15.00    Pack years: 7.50    Types: Cigarettes    Quit date: 06/26/2016    Years since quitting: 2.8  . Smokeless tobacco: Never Used  Substance Use Topics  . Alcohol use: No  . Drug use: No    Patient Active Problem List   Diagnosis Date Noted  . 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 Atrial fibrillation ([place]) 07/02/2016  . Anxiety, generalized 06/27/2016  . Chest pain 04/21/2016  . COPD, mild ([place]) 03/23/2016  . 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 ASHD (arteriosclerotic heart disease) 11/02/2011  . Chronic back pain 11/02/2011    Home Medications:    Current Meds  Medication Sig  . pantoprazole (PROTONIX) 40 MG tablet Take by mouth.    Allergies:   Flexeril [cyclobenzaprine], Celecoxib, Gabapentin, Iodinated diagnostic agents, Morphine and related, Percocet [oxycodone-acetaminophen], Skelaxin [metaxalone], Cataflam [diclofenac], Demerol [meperidine], Nortriptyline, Tramadol, Ketorolac, and Norflex [orphenadrine citrate]  Review of Systems (ROS): Review of Systems  Constitutional: Negative for chills and fever.  HENT: Positive for sinus pain and sore throat. Negative for congestion,  drooling and ear pain.        White coating on tongue  Respiratory: Negative for cough and shortness of breath.   Cardiovascular: Negative for chest pain and palpitations.  Gastrointestinal: Negative for abdominal pain, nausea and vomiting.  Musculoskeletal: Negative for back pain, myalgias and neck pain.  Skin: Negative for color change, pallor and rash.  Neurological: Negative for dizziness, syncope, weakness and headaches.  All  other systems reviewed and are negative.    Vital Signs: Today's Vitals   04/15/19 0929 04/15/19 0930 04/15/19 0931 04/15/19 1036  BP: 129/70     Pulse: 68     Resp: 19     Temp: 98.2 F (36.8 C)     TempSrc: Oral     SpO2: 100%     Weight:   252 lb (114.3 kg)   Height:   4\' 11"  (1.499 m)   PainSc:  5   5     Physical Exam: Physical Exam  Constitutional: She is oriented to person, place, and time and well-developed, well-nourished, and in no distress.  HENT:  Head: Normocephalic and atraumatic.  Nose: Nose normal.  Mouth/Throat: Uvula is midline and mucous membranes are normal. Posterior oropharyngeal edema and posterior oropharyngeal erythema present.  Tongue with thick white coating.   Eyes: Pupils are equal, round, and reactive to light.  Cardiovascular: Normal rate, regular rhythm, normal heart sounds and intact distal pulses.  Pulmonary/Chest: Effort normal and breath sounds normal.  Neurological: She is alert and oriented to person, place, and time. Gait normal.  Skin: Skin is warm and dry. No rash noted. She is not diaphoretic.  Psychiatric: Mood, memory, affect and judgment normal.  Nursing note and vitals reviewed.   Urgent Care Treatments / Results:   Orders Placed This Encounter  Procedures  . Group A Strep by PCR    LABS: PLEASE NOTE: all labs that were ordered this encounter are listed, however only abnormal results are displayed. Labs Reviewed  GROUP A STREP BY PCR    EKG: -None  RADIOLOGY: No results found.  PROCEDURES: Procedures  MEDICATIONS RECEIVED THIS VISIT: Medications - No data to display  PERTINENT CLINICAL COURSE NOTES/UPDATES:   Initial Impression / Assessment and Plan / Urgent Care Course:  Pertinent labs & imaging results that were available during my care of the patient were personally reviewed by me and considered in my medical decision making (see lab/imaging section of note for values and interpretations).  [name]  is a 54 y.o. female who [facility] to [facility] today with complaints of Tongue Sore and Sore Throat   Patient is well appearing overall in clinic today. She does not appear to be in any acute distress. Presenting symptoms (see HPI) and exam as documented above. Symptoms worsening over 3 days. PMH (+) for recurrent pharyngitis. Given exam and PMH will proceed with treatment using a 10 day course of amoxicillin. Discussed supportive care measures at home during acute phase of illness. Patient to rest as much as possible. She was encouraged to ensure adequate hydration (water and ORS) to prevent dehydration and electrolyte derangements. Recommended warm salt water gargles, hard candies/lozenges, and hot tea with honey/lemon to help soothe the throat and reduce irritation. May use Tylenol and/or Ibuprofen as needed for pain/fever. Will also send in a supply of antifungal mouthwash for use to improve patient's oropharyngeal candidiasis.   Discussed follow up with primary care physician in 1 week for re-evaluation. I have reviewed the follow up and  strict return precautions for any new or worsening symptoms. Patient is aware of symptoms that would be deemed urgent/emergent, and would thus require further evaluation either here or in the emergency department. At the time of discharge, she verbalized understanding and consent with the discharge plan as it was reviewed with her. All questions were fielded by provider and/or clinic staff prior to patient discharge.    Final Clinical Impressions / Urgent Care Diagnoses:   Final diagnoses:  Pharyngitis, unspecified etiology  Thrush    New Prescriptions:  [place] Controlled Substance Registry consulted? Not Applicable  Meds ordered this encounter  Medications  . amoxicillin (AMOXIL) 500 MG capsule    Sig: Take 1 capsule (500 mg total) by mouth 2 (two) times daily for 10 days.    Dispense:  20 capsule    Refill:  0  . magic mouthwash SOLN    Sig: Take  10 mLs by mouth 4 (four) times daily.    Dispense:  200 mL    Refill:  0    Recommended Follow up Care:  Patient encouraged to follow up with the following provider within the specified time frame, or sooner as dictated by the severity of her symptoms. As always, she was instructed that for any urgent/emergent care needs, she should seek care either here or in the emergency department for more immediate evaluation.  Follow-up Information    Rayetta Humphrey, MD In 1 week.   Specialty: Family Medicine Why: General reassessment of symptoms if not improving Contact information: [address] [place] [address] [phone]         NOTE: This note was prepared using Dragon dictation software along with smaller phrase technology. Despite my best ability to proofread, there is the potential that transcriptional errors may still occur from this process, and are completely unintentional.    Verlee Monte, NP 04/16/19 [phone]

## 2019-09-16 ENCOUNTER — Emergency Department: Payer: Medicaid Other

## 2019-09-16 ENCOUNTER — Other Ambulatory Visit: Payer: Self-pay

## 2019-09-16 ENCOUNTER — Emergency Department
Admission: EM | Admit: 2019-09-16 | Discharge: 2019-09-16 | Disposition: A | Discharge status: Home / Self Care | Payer: Medicaid Other | Attending: Emergency Medicine | Admitting: Emergency Medicine

## 2019-09-16 DIAGNOSIS — J449 Chronic obstructive pulmonary disease, unspecified: Secondary | ICD-10-CM | POA: Diagnosis not present

## 2019-09-16 DIAGNOSIS — I2510 Atherosclerotic heart disease of native coronary artery without angina pectoris: Secondary | ICD-10-CM | POA: Insufficient documentation

## 2019-09-16 DIAGNOSIS — E119 Type 2 diabetes mellitus without complications: Secondary | ICD-10-CM | POA: Diagnosis not present

## 2019-09-16 DIAGNOSIS — Z87891 Personal history of nicotine dependence: Secondary | ICD-10-CM | POA: Insufficient documentation

## 2019-09-16 DIAGNOSIS — Z7982 Long term (current) use of aspirin: Secondary | ICD-10-CM | POA: Diagnosis not present

## 2019-09-16 DIAGNOSIS — Z7901 Long term (current) use of anticoagulants: Secondary | ICD-10-CM | POA: Insufficient documentation

## 2019-09-16 DIAGNOSIS — I10 Essential (primary) hypertension: Secondary | ICD-10-CM | POA: Insufficient documentation

## 2019-09-16 DIAGNOSIS — G8929 Other chronic pain: Secondary | ICD-10-CM | POA: Insufficient documentation

## 2019-09-16 DIAGNOSIS — Z952 Presence of prosthetic heart valve: Secondary | ICD-10-CM | POA: Insufficient documentation

## 2019-09-16 DIAGNOSIS — Z7984 Long term (current) use of oral hypoglycemic drugs: Secondary | ICD-10-CM | POA: Insufficient documentation

## 2019-09-16 DIAGNOSIS — R079 Chest pain, unspecified: Secondary | ICD-10-CM | POA: Diagnosis present

## 2019-09-16 DIAGNOSIS — M546 Pain in thoracic spine: Secondary | ICD-10-CM | POA: Diagnosis not present

## 2019-09-16 LAB — BASIC METABOLIC PANEL
Anion gap: 7 (ref 5–15)
BUN: 14 mg/dL (ref 6–20)
CO2: 27 mmol/L (ref 22–32)
Calcium: 9.2 mg/dL (ref 8.9–10.3)
Chloride: 107 mmol/L (ref 98–111)
Creatinine, Ser: 0.98 mg/dL (ref 0.44–1.00)
GFR calc Af Amer: >60 mL/min (ref >60)
GFR calc non Af Amer: >60 mL/min (ref >60)
Glucose, Bld: 110 mg/dL — ABNORMAL HIGH (ref 70–99)
Potassium: 4.1 mmol/L (ref 3.5–5.1)
Sodium: 141 mmol/L (ref 135–145)

## 2019-09-16 LAB — HEPATIC FUNCTION PANEL
ALT: 16 U/L (ref 0–44)
AST: 18 U/L (ref 15–41)
Albumin: 4.1 g/dL (ref 3.5–5.0)
Alkaline Phosphatase: 57 U/L (ref 38–126)
Bilirubin, Direct: <0.1 mg/dL (ref 0.0–0.2)
Total Bilirubin: 0.5 mg/dL (ref 0.3–1.2)
Total Protein: 7 g/dL (ref 6.5–8.1)

## 2019-09-16 LAB — LIPASE, BLOOD: Lipase: 38 U/L (ref 11–51)

## 2019-09-16 LAB — CBC
HCT: 39.7 % (ref 36.0–46.0)
Hemoglobin: 11.4 g/dL — ABNORMAL LOW (ref 12.0–15.0)
MCH: 22.2 pg — ABNORMAL LOW (ref 26.0–34.0)
MCHC: 28.7 g/dL — ABNORMAL LOW (ref 30.0–36.0)
MCV: 77.4 fL — ABNORMAL LOW (ref 80.0–100.0)
Platelets: 255 10*3/uL (ref 150–400)
RBC: 5.13 MIL/uL — ABNORMAL HIGH (ref 3.87–5.11)
RDW: 16.7 % — ABNORMAL HIGH (ref 11.5–15.5)
WBC: 9 10*3/uL (ref 4.0–10.5)
nRBC: 0 % (ref 0.0–0.2)

## 2019-09-16 LAB — URINALYSIS, ROUTINE W REFLEX MICROSCOPIC
Bilirubin Urine: NEGATIVE
Glucose, UA: NEGATIVE mg/dL
Hgb urine dipstick: NEGATIVE
Ketones, ur: NEGATIVE mg/dL
Leukocytes,Ua: NEGATIVE
Nitrite: NEGATIVE
Protein, ur: NEGATIVE mg/dL
Specific Gravity, Urine: 1.017 (ref 1.005–1.030)
pH: 5 (ref 5.0–8.0)

## 2019-09-16 LAB — PROTIME-INR
INR: 1.7 — ABNORMAL HIGH (ref 0.8–1.2)
Prothrombin Time: 19.4 seconds — ABNORMAL HIGH (ref 11.4–15.2)

## 2019-09-16 LAB — TROPONIN I (HIGH SENSITIVITY)
Troponin I (High Sensitivity): 4 ng/L (ref <18)
Troponin I (High Sensitivity): 4 ng/L (ref <18)

## 2019-09-16 MED ORDER — SODIUM CHLORIDE 0.9% FLUSH: 3.0000 mL | Freq: Once | INTRAVENOUS | Status: DC

## 2019-09-16 NOTE — ED Triage Notes (Addendum)
Pt c/o BL flank pain that wraps around to the center of the chest , takes my breath away. States she had a recent fall about 2-3 weeks ago and had negative xray. Pt is in NAD. At present. Pt states the pain has worsened since the fall

## 2019-09-16 NOTE — ED Provider Notes (Signed)
[facility] Emergency Department Provider Note  ____________________________________________   First MD Initiated Contact with Patient 09/16/19 [phone]     (approximate)  I have reviewed the triage vital signs and the nursing notes.   HISTORY  Chief Complaint Chest Pain    HPI [name] is a 54 y.o. female with coronary disease, diabetes, lupus, hypertension who comes in with chest pain.  Patient states that she does have a lot of chronic pain secondary to cervical stenosis and knee pain.  Patient is on Vicodin, muscle relaxant, Klonopin.  Patient states that she follows with a pain clinic doctor.  She reports that about a month ago she had a fall onto her back from about a foot stool and she has had bilateral upper thoracic pain since then.  Pain is moderate to severe, worse with twisting movements, better at rest.  She presented today because of the pain today radiate up into her chest this morning when she was at [facility].  She was worried there could be something wrong with her heart or her liver.  She is been taking her medications with only minimal relief.  She denies any chest pain, shortness of breath, cough, numbness in her arms or legs, weakness in her arms or legs.  Denies any rectal numbness.  Denies any urinary incontinence.  She has no midline tenderness.  She states that she was previously on lithium for bipolar but was taken off due to having too high of levels.  Her lithium level was rechecked and I confirmed this on her phone and was back to normal.  She is now on Jordan.  She states that her mental status is doing okay and denies any SI.  She states that she normally cuts and does not have any recent cutting episodes.  To note patient did have a CT scan after the fall of her cervical spine that showed moderate to severe stenosis.          Past Medical History:  Diagnosis Date  . CAD (coronary artery disease)    per cardiac MRI  . Diabetes  mellitus without complication (HCC)   . Hypertension   . Lupus (systemic lupus erythematosus) (HCC)     Patient Active Problem List   Diagnosis Date Noted  . 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 Atrial fibrillation (HCC) 07/02/2016  . Anxiety, generalized 06/27/2016  . Chest pain 04/21/2016  . 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 ASHD (arteriosclerotic heart disease) 11/02/2011  . Chronic back pain 11/02/2011    Past Surgical History:  Procedure Laterality Date  . APPENDECTOMY    . CESAREAN SECTION    . KNEE ARTHROSCOPY    . TONSILLECTOMY    . VALVE REPLACEMENT      Prior to Admission medications   Medication Sig Start Date End Date Taking? Authorizing Provider  acetaminophen (TYLENOL) 500 MG tablet Take 500 mg by mouth every 6 (six) hours as needed for mild pain.     [provider]  albuterol (PROVENTIL HFA;VENTOLIN HFA) 108 (90 Base) MCG/ACT inhaler Inhale 1-2 puffs into the lungs every 6 (six) hours as needed for wheezing or shortness of breath.    [provider]  amLODipine (NORVASC) 5 MG tablet Take 5 mg by mouth daily.    [provider]  aspirin 81 MG tablet Take 81 mg by mouth daily.    [provider]  BEPREVE 1.5 %  SOLN 1 drop 2 (two) times daily. 03/20/19   [provider]  clonazePAM (KLONOPIN) 1 MG tablet Take 1 mg by mouth at bedtime as needed. 03/15/19   [provider]  Divalproex Sodium (DEPAKOTE PO) Take by mouth. Take 500MG  by mouth every morning and 750MG  by mouth every evening    [provider]  DULERA 200-5 MCG/ACT AERO Inhale 2 puffs into the lungs 2 (two) times daily. 04/09/19   [provider]  furosemide (LASIX) 20 MG tablet Take 20 mg by mouth daily as needed for fluid or edema.  07/18/16 07/18/17  [provider]  HYDROcodone-acetaminophen (NORCO/VICODIN) 5-325 MG tablet Take 1 tablet by mouth 4 (four) times  daily.     [provider]  lithium carbonate (LITHOBID) 300 MG CR tablet Take 300 mg by mouth 2 (two) times daily. 03/27/19   [provider]  magic mouthwash SOLN Take 10 mLs by mouth 4 (four) times daily. 04/15/19   Karen Kitchens, NP  metFORMIN (GLUCOPHAGE) 500 MG tablet Take 500 mg by mouth 2 (two) times daily with a meal.     [provider]  methocarbamol (ROBAXIN) 500 MG tablet Take 1 tablet (500 mg total) by mouth 2 (two) times daily. Patient not taking: Reported on 06/28/2017 01/06/15   Norval Gable, MD  metoprolol tartrate (LOPRESSOR) 25 MG tablet Take 25 mg by mouth 2 (two) times daily.    [provider]  pantoprazole (PROTONIX) 40 MG tablet Take by mouth. 12/04/18 12/04/19  [provider]  potassium chloride SA (K-DUR,KLOR-CON) 20 MEQ tablet Take 20 mEq by mouth daily.     [provider]  sucralfate (CARAFATE) 1 g tablet Take 1 tablet (1 g total) by mouth 4 (four) times daily. 12/13/16   Nance Pear, MD  warfarin (COUMADIN) 5 MG tablet Take 2MG  by mouth every evening Thursday to Tuesday and 5MG  by mouth on Wednesday evening    [provider]  budesonide-formoterol (SYMBICORT) 160-4.5 MCG/ACT inhaler Inhale 2 puffs into the lungs 2 (two) times daily. 01/14/16 04/15/19  [provider]  OLANZapine (ZYPREXA) 5 MG tablet Take 5 mg by mouth at bedtime.  04/15/19  [provider]  pravastatin (PRAVACHOL) 40 MG tablet Take 40 mg by mouth at bedtime.   04/15/19  [provider]    Allergies Flexeril [cyclobenzaprine], Celecoxib, Gabapentin, Iodinated diagnostic agents, Morphine and related, Percocet [oxycodone-acetaminophen], Skelaxin [metaxalone], Cataflam [diclofenac], Demerol [meperidine], Nortriptyline, Tramadol, Ketorolac, and Norflex [orphenadrine citrate]  Family History  Problem Relation Age of Onset  . Hyperlipidemia Mother   . Hypertension Father   . Diabetes Father   . Hyperlipidemia  Father     Social History Social History   Tobacco Use  . Smoking status: Former Smoker    Packs/day: 0.50    Years: 15.00    Pack years: 7.50    Types: Cigarettes    Quit date: 06/26/2016    Years since quitting: 3.2  . Smokeless tobacco: Never Used  Vaping Use  . Vaping Use: Never used  Substance Use Topics  . Alcohol use: No  . Drug use: No      Review of Systems Constitutional: No fever/chills Eyes: No visual changes. ENT: No sore throat. Cardiovascular: Positive chest pain Respiratory: Denies shortness of breath. Gastrointestinal: No abdominal pain.  No nausea, no vomiting.  No diarrhea.  No constipation. Genitourinary: Negative for dysuria. Musculoskeletal: Upper back pain Skin: Negative for rash. Neurological: Negative for headaches, focal weakness or numbness.  All other ROS negative ____________________________________________   PHYSICAL EXAM:  VITAL SIGNS: ED Triage Vitals [09/16/19 0828]  Enc Vitals Group     BP (!) 152/84     Pulse Rate 69     Resp 16     Temp 99.4 F (37.4 C)     Temp Source Oral     SpO2 97 %     Weight      Height      Head Circumference      Peak Flow      Pain Score      Pain Loc      Pain Edu?      Excl. in GC?     Constitutional: Alert and oriented. Well appearing and in no acute distress. Eyes: Conjunctivae are normal. EOMI. Head: Atraumatic. Nose: No congestion/rhinnorhea. Mouth/Throat: Mucous membranes are moist.   Neck: No stridor. Trachea Midline. FROM Cardiovascular: Normal rate, regular rhythm. Grossly normal heart sounds.  Good peripheral circulation. Respiratory: Normal respiratory effort.  No retractions. Lungs CTAB. Gastrointestinal: Soft and nontender. No distention. No abdominal bruits.  Musculoskeletal: No lower extremity tenderness nor edema.  Patient has reproducible upper back pain in the thoracic region.  She has no midline tenderness.  She is equal strength in her arms and legs, no numbness in  her arms or legs.  Pain is worse with twisting movement and with standing up.  Pt is still able to ambulate with normal gate.  Neurologic:  Normal speech and language. No gross focal neurologic deficits are appreciated.  Skin:  Skin is warm, dry and intact. No rash noted. Psychiatric: Mood and affect are normal. Speech and behavior are normal. GU: Deferred   ____________________________________________   LABS (all labs ordered are listed, but only abnormal results are displayed)  Labs Reviewed  BASIC METABOLIC PANEL - Abnormal; Notable for the following components:      Result Value   Glucose, Bld 110 (*)    All other components within normal limits  CBC - Abnormal; Notable for the following components:   RBC 5.13 (*)    Hemoglobin 11.4 (*)    MCV 77.4 (*)    MCH 22.2 (*)    MCHC 28.7 (*)    RDW 16.7 (*)    All other components within normal limits  URINALYSIS, ROUTINE W REFLEX MICROSCOPIC  HEPATIC FUNCTION PANEL  LIPASE, BLOOD  PROTIME-INR  POC URINE PREG, ED  TROPONIN I (HIGH SENSITIVITY)  TROPONIN I (HIGH SENSITIVITY)   ____________________________________________   ED ECG REPORT I, Concha Se, the attending physician, personally viewed and interpreted this ECG.  EKG sinus rate of 65, left bundle branch block, negative scar Bosa, no T wave inversion  EKG prior shows the similar left upper and block but had T wave inversions in that one.  ____________________________________________  RADIOLOGY I, Concha Se, personally viewed and evaluated these images (plain radiographs) as part of my medical decision making, as well as reviewing the written report by the radiologist.  ED MD interpretation: No infiltrate  Official radiology report(s): DG Chest 2 View  Result Date: 09/16/2019 CLINICAL DATA:  Chest pain EXAM: CHEST - 2 VIEW COMPARISON:  06/28/2017 FINDINGS: Cardiac shadow is stable. Postsurgical changes are again seen. Lungs are well aerated bilaterally. No  focal infiltrate or sizable effusion is noted. No bony abnormality is seen. IMPRESSION: No acute abnormality noted. Electronically Signed   By: Alcide Clever M.D.   On: 09/16/2019 09:05    ____________________________________________  PROCEDURES  Procedure(s) performed (including Critical Care):  Procedures   ____________________________________________   INITIAL IMPRESSION / ASSESSMENT AND PLAN / ED COURSE   Damiya Sandefur was evaluated in Emergency Department on 09/16/2019 for the symptoms described in the history of present illness. She was evaluated in the context of the global COVID-19 pandemic, which necessitated consideration that the patient might be at risk for infection with the SARS-CoV-2 virus that causes COVID-19. Institutional protocols and algorithms that pertain to the evaluation of patients at risk for COVID-19 are in a state of rapid change based on information released by regulatory bodies including the CDC and federal and state organizations. These policies and algorithms were followed during the patient's care in the ED.    Most Likely DDx:  -MSK (atypical chest pain) but will get cardiac markers to evaluate for ACS given risk factors/age.  Seems to be less likely related to her abdomen given she does not seem to be tender in her abdomen we will add on LFTs and lipase just to make sure no signs of pancreatitis or cholecystitis although have low suspicion.  Psychiatrically patient is followed by psych and on Latuda.  Denies any acute psychiatric concerns at this time. NO symptoms of cord compression and pain is NOT midline.  Bilateral upper flanks. No rash noted.     DDx that was also considered d/t potential to cause harm, but was found less likely based on history and physical (as detailed above): -PNA (no fevers, cough but CXR to evaluate) -PNX (reassured with equal b/l breath sounds, CXR to evaluate) -Symptomatic anemia (will get H&H) -Pulmonary embolism as no sob  at rest, not pleuritic in nature, no hypoxia -Aortic Dissection as no tearing pain and no radiation to the mid back, pulses equal.  Pain is been going on for a few weeks. -Pericarditis no rub on exam, EKG changes or hx to suggest dx -Tamponade (no notable SOB, tachycardic, hypotensive) -Esophageal rupture (no h/o diffuse vomitting/no crepitus)   Labs are reassuring with negative cardiac marker, no evidence of significant anemia.  Hemoglobin is around baseline.  No electrolyte abnormalities.  Cardiac markers x2 are negative.  UA without evidence of UTI.  Hepatic function and lipase are also normal I have low suspicion for abdominal pathology.  Discussed with patient that she is already on significant amount of pain medication and I would not be able to adjust her medicines.  I suspect this is most likely musculoskeletal in nature and she will need to follow-up with her pain clinic doctor given the prolonged pain now for 4 weeks.  Patient expressed understanding and was comfortable talking to her pain doctor  I discussed the provisional nature of ED diagnosis, the treatment so far, the ongoing plan of care, follow up appointments and return precautions with the patient and any family or support people present. They expressed understanding and agreed with the plan, discharged home.          ____________________________________________   FINAL CLINICAL IMPRESSION(S) / ED DIAGNOSES   Final diagnoses:  Chronic bilateral thoracic back pain     MEDICATIONS GIVEN DURING THIS VISIT:  Medications  sodium chloride flush (NS) 0.9 % injection 3 mL (has no administration in time range)     ED Discharge Orders    None       Note:  This document was prepared using Dragon voice recognition software and may include unintentional dictation errors.   Concha Se, MD 09/16/19 1150

## 2019-09-16 NOTE — Discharge Instructions (Signed)
Your work-up was reassuring there was no signs of heart attack or liver issues.  You should follow-up with your pain doctor to discuss adjustments to your pain medications.  Return to the ER if you develop fevers, worsening pain, shortness of breath or any other concerns

## 2019-09-16 NOTE — ED Notes (Signed)
Rainbow was drawn and sent to lab. ?

## 2019-11-29 ENCOUNTER — Emergency Department
Admission: EM | Admit: 2019-11-29 | Discharge: 2019-11-29 | Discharge status: Absent without leave | Payer: Medicare Other

## 2020-01-13 ENCOUNTER — Emergency Department: Payer: Medicare Other

## 2020-01-13 ENCOUNTER — Encounter: Payer: Self-pay | Admitting: Emergency Medicine

## 2020-01-13 DIAGNOSIS — Z79899 Other long term (current) drug therapy: Secondary | ICD-10-CM | POA: Insufficient documentation

## 2020-01-13 DIAGNOSIS — K219 Gastro-esophageal reflux disease without esophagitis: Secondary | ICD-10-CM | POA: Insufficient documentation

## 2020-01-13 DIAGNOSIS — Z7982 Long term (current) use of aspirin: Secondary | ICD-10-CM | POA: Diagnosis not present

## 2020-01-13 DIAGNOSIS — Z7984 Long term (current) use of oral hypoglycemic drugs: Secondary | ICD-10-CM | POA: Diagnosis not present

## 2020-01-13 DIAGNOSIS — I119 Hypertensive heart disease without heart failure: Secondary | ICD-10-CM | POA: Insufficient documentation

## 2020-01-13 DIAGNOSIS — J449 Chronic obstructive pulmonary disease, unspecified: Secondary | ICD-10-CM | POA: Insufficient documentation

## 2020-01-13 DIAGNOSIS — E119 Type 2 diabetes mellitus without complications: Secondary | ICD-10-CM | POA: Diagnosis not present

## 2020-01-13 DIAGNOSIS — Z87891 Personal history of nicotine dependence: Secondary | ICD-10-CM | POA: Diagnosis not present

## 2020-01-13 DIAGNOSIS — I2510 Atherosclerotic heart disease of native coronary artery without angina pectoris: Secondary | ICD-10-CM | POA: Diagnosis not present

## 2020-01-13 DIAGNOSIS — R0789 Other chest pain: Secondary | ICD-10-CM | POA: Diagnosis present

## 2020-01-13 DIAGNOSIS — Z7901 Long term (current) use of anticoagulants: Secondary | ICD-10-CM | POA: Diagnosis not present

## 2020-01-13 LAB — CBC
HCT: 39.9 % (ref 36.0–46.0)
Hemoglobin: 12.3 g/dL (ref 12.0–15.0)
MCH: 23.7 pg — ABNORMAL LOW (ref 26.0–34.0)
MCHC: 30.8 g/dL (ref 30.0–36.0)
MCV: 77 fL — ABNORMAL LOW (ref 80.0–100.0)
Platelets: 192 10*3/uL (ref 150–400)
RBC: 5.18 MIL/uL — ABNORMAL HIGH (ref 3.87–5.11)
RDW: 17.7 % — ABNORMAL HIGH (ref 11.5–15.5)
WBC: 11.4 10*3/uL — ABNORMAL HIGH (ref 4.0–10.5)
nRBC: 0 % (ref 0.0–0.2)

## 2020-01-13 LAB — BASIC METABOLIC PANEL
Anion gap: 10 (ref 5–15)
BUN: 13 mg/dL (ref 6–20)
CO2: 26 mmol/L (ref 22–32)
Calcium: 8.8 mg/dL — ABNORMAL LOW (ref 8.9–10.3)
Chloride: 105 mmol/L (ref 98–111)
Creatinine, Ser: 0.85 mg/dL (ref 0.44–1.00)
GFR, Estimated: >60 mL/min (ref >60)
Glucose, Bld: 94 mg/dL (ref 70–99)
Potassium: 3.5 mmol/L (ref 3.5–5.1)
Sodium: 141 mmol/L (ref 135–145)

## 2020-01-13 LAB — TROPONIN I (HIGH SENSITIVITY)
Troponin I (High Sensitivity): 7 ng/L (ref <18)
Troponin I (High Sensitivity): 8 ng/L (ref <18)

## 2020-01-13 NOTE — ED Triage Notes (Signed)
Pt c/o left sided chest pain and lip numbness that started today at 1200. Pt denies SOB, N/V. Pt describes pain as intermittent "sharp ache."

## 2020-01-14 ENCOUNTER — Emergency Department
Admission: EM | Admit: 2020-01-14 | Discharge: 2020-01-14 | Disposition: A | Discharge status: Home / Self Care | Payer: Medicare Other | Attending: Emergency Medicine | Admitting: Emergency Medicine

## 2020-01-14 DIAGNOSIS — K219 Gastro-esophageal reflux disease without esophagitis: Secondary | ICD-10-CM

## 2020-01-14 DIAGNOSIS — R079 Chest pain, unspecified: Secondary | ICD-10-CM

## 2020-01-14 NOTE — ED Notes (Signed)
Assumed care of pt upon being roomed. Denies pain. AO x4, breathing regular and unlabored. DC reviewed by Rn and provider

## 2020-01-14 NOTE — ED Provider Notes (Signed)
[facility] Emergency Department Provider Note   ____________________________________________   First MD Initiated Contact with Patient 01/14/20 [phone]     (approximate)  I have reviewed the triage vital signs and the nursing notes.   HISTORY  Chief Complaint Chest Pain    HPI [name] is a 54 y.o. female who presents to the ED from home with a chief complaint of chest pain.  Patient has a history of fibromyalgia, diabetes, lupus, aortic valve replacement on Coumadin who reports sharp ache to midsternal chest approximately noon.  Notes that she forgot to put her morning dose of Protonix in her pillbox this past week and has only been taking her nighttime dosing instead of twice daily as directed.  Also notes she ate stew tomatoes the night before which she feels like aggravated her GERD.  Symptoms not associated with diaphoresis, shortness of breath, nausea/vomiting or dizziness.  Symptoms alleviated completely with Maalox.  Denies recent fever, cough, abdominal pain, dysuria or diarrhea.  She is unvaccinated against COVID-19 but denies concerns for exposure.      Past Medical History:  Diagnosis Date  . CAD (coronary artery disease)    per cardiac MRI  . Diabetes mellitus without complication (HCC)   . Hypertension   . Lupus (systemic lupus erythematosus) (HCC)     Patient Active Problem List   Diagnosis Date Noted  . 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 Atrial fibrillation (HCC) 07/02/2016  . Anxiety, generalized 06/27/2016  . Chest pain 04/21/2016  . 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 ASHD (arteriosclerotic heart disease) 11/02/2011  . Chronic back pain 11/02/2011    Past Surgical History:  Procedure Laterality Date  . APPENDECTOMY    . CESAREAN SECTION    . KNEE ARTHROSCOPY    . TONSILLECTOMY    . VALVE REPLACEMENT      Prior to Admission medications   Medication Sig  Start Date End Date Taking? Authorizing Provider  acetaminophen (TYLENOL) 500 MG tablet Take 500 mg by mouth every 6 (six) hours as needed for mild pain.     [provider]  albuterol (PROVENTIL HFA;VENTOLIN HFA) 108 (90 Base) MCG/ACT inhaler Inhale 1-2 puffs into the lungs every 6 (six) hours as needed for wheezing or shortness of breath.    [provider]  amLODipine (NORVASC) 5 MG tablet Take 5 mg by mouth daily.    [provider]  aspirin 81 MG tablet Take 81 mg by mouth daily.    [provider]  BEPREVE 1.5 % SOLN 1 drop 2 (two) times daily. 03/20/19   [provider]  clonazePAM (KLONOPIN) 1 MG tablet Take 1 mg by mouth at bedtime as needed. 03/15/19   [provider]  Divalproex Sodium (DEPAKOTE PO) Take by mouth. Take 500MG  by mouth every morning and 750MG  by mouth every evening    [provider]  DULERA 200-5 MCG/ACT AERO Inhale 2 puffs into the lungs 2 (two) times daily. 04/09/19   [provider]  furosemide (LASIX) 20 MG tablet Take 20 mg by mouth daily as needed for fluid or edema.  07/18/16 07/18/17  [provider]  HYDROcodone-acetaminophen (NORCO/VICODIN) 5-325 MG tablet Take 1 tablet by mouth 4 (four) times daily.     [provider]  lithium carbonate (LITHOBID) 300 MG CR tablet Take 300 mg by mouth 2 (two) times daily. 03/27/19   [provider]  magic  mouthwash SOLN Take 10 mLs by mouth 4 (four) times daily. 04/15/19   Verlee MonteGray, Bryan E, NP  metFORMIN (GLUCOPHAGE) 500 MG tablet Take 500 mg by mouth 2 (two) times daily with a meal.     [provider]  methocarbamol (ROBAXIN) 500 MG tablet Take 1 tablet (500 mg total) by mouth 2 (two) times daily. Patient not taking: Reported on 06/28/2017 01/06/15   Payton Mccallumonty, [place], MD  metoprolol tartrate (LOPRESSOR) 25 MG tablet Take 25 mg by mouth 2 (two) times daily.    [provider]  potassium chloride SA (K-DUR,KLOR-CON) 20 MEQ  tablet Take 20 mEq by mouth daily.     [provider]  sucralfate (CARAFATE) 1 g tablet Take 1 tablet (1 g total) by mouth 4 (four) times daily. 12/13/16   Phineas SemenGoodman, Graydon, MD  warfarin (COUMADIN) 5 MG tablet Take 2MG  by mouth every evening Thursday to Tuesday and 5MG  by mouth on Wednesday evening    [provider]  budesonide-formoterol (SYMBICORT) 160-4.5 MCG/ACT inhaler Inhale 2 puffs into the lungs 2 (two) times daily. 01/14/16 04/15/19  [provider]  OLANZapine (ZYPREXA) 5 MG tablet Take 5 mg by mouth at bedtime.  04/15/19  [provider]  pravastatin (PRAVACHOL) 40 MG tablet Take 40 mg by mouth at bedtime.   04/15/19  [provider]    Allergies Flexeril [cyclobenzaprine], Celecoxib, Gabapentin, Iodinated diagnostic agents, Morphine and related, Percocet [oxycodone-acetaminophen], Skelaxin [metaxalone], Cataflam [diclofenac], Demerol [meperidine], Nortriptyline, Tramadol, Ketorolac, and Norflex [orphenadrine citrate]  Family History  Problem Relation Age of Onset  . Hyperlipidemia Mother   . Hypertension Father   . Diabetes Father   . Hyperlipidemia Father     Social History Social History   Tobacco Use  . Smoking status: Former Smoker    Packs/day: 0.50    Years: 15.00    Pack years: 7.50    Types: Cigarettes    Quit date: 06/26/2016    Years since quitting: 3.5  . Smokeless tobacco: Never Used  Vaping Use  . Vaping Use: Never used  Substance Use Topics  . Alcohol use: No  . Drug use: No    Review of Systems  Constitutional: No fever/chills Eyes: No visual changes. ENT: No sore throat. Cardiovascular: Positive for chest pain. Respiratory: Denies shortness of breath. Gastrointestinal: No abdominal pain.  No nausea, no vomiting.  No diarrhea.  No constipation. Genitourinary: Negative for dysuria. Musculoskeletal: Negative for back pain. Skin: Negative for rash. Neurological: Negative for headaches, focal weakness or  numbness.   ____________________________________________   PHYSICAL EXAM:  VITAL SIGNS: ED Triage Vitals [01/13/20 1952]  Enc Vitals Group     BP (!) 166/95     Pulse Rate 67     Resp 20     Temp 98.2 F (36.8 C)     Temp Source Oral     SpO2 98 %     Weight 251 lb (113.9 kg)     Height 4\' 11"  (1.499 m)     Head Circumference      Peak Flow      Pain Score      Pain Loc      Pain Edu?      Excl. in GC?     Constitutional: Alert and oriented. Well appearing and in no acute distress. Eyes: Conjunctivae are normal. PERRL. EOMI. Head: Atraumatic. Nose: No congestion/rhinnorhea. Mouth/Throat: Mucous membranes are moist.  Oropharynx non-erythematous. Neck: No stridor.   Cardiovascular: Normal rate, regular rhythm. Grossly  normal heart sounds.  Good peripheral circulation. Respiratory: Normal respiratory effort.  No retractions. Lungs CTAB. Gastrointestinal: Soft and nontender to light or deep palpation. No distention. No abdominal bruits. No CVA tenderness. Musculoskeletal: No lower extremity tenderness nor edema.  No joint effusions. Neurologic:  Normal speech and language. No gross focal neurologic deficits are appreciated. No gait instability. Skin:  Skin is warm, dry and intact. No rash noted. Psychiatric: Mood and affect are normal. Speech and behavior are normal.  ____________________________________________   LABS (all labs ordered are listed, but only abnormal results are displayed)  Labs Reviewed  BASIC METABOLIC PANEL - Abnormal; Notable for the following components:      Result Value   Calcium 8.8 (*)    All other components within normal limits  CBC - Abnormal; Notable for the following components:   WBC 11.4 (*)    RBC 5.18 (*)    MCV 77.0 (*)    MCH 23.7 (*)    RDW 17.7 (*)    All other components within normal limits  TROPONIN I (HIGH SENSITIVITY)  TROPONIN I (HIGH SENSITIVITY)   ____________________________________________  EKG  ED ECG  REPORT I, Keleigh Kazee J, the attending physician, personally viewed and interpreted this ECG.   Date: 01/14/2020  EKG Time: 1948  Rate: 65  Rhythm: normal EKG, normal sinus rhythm  Axis: RAD  Intervals:none  ST&T Change: Nonspecific No significant change from 09/17/2019 ____________________________________________  RADIOLOGY Tawni Millers J, personally viewed and evaluated these images (plain radiographs) as part of my medical decision making, as well as reviewing the written report by the radiologist.  ED MD interpretation: No acute cardiopulmonary process  Official radiology report(s): DG Chest 2 View  Result Date: 01/13/2020 CLINICAL DATA:  Chest pain EXAM: CHEST - 2 VIEW COMPARISON:  09/16/2019 FINDINGS: Post sternotomy changes. No focal opacity or pleural effusion. Normal heart size. No pneumothorax. IMPRESSION: No active cardiopulmonary disease. Electronically Signed   By: Jasmine Pang M.D.   On: 01/13/2020 20:16    ____________________________________________   PROCEDURES  Procedure(s) performed (including Critical Care):  .1-3 Lead EKG Interpretation Performed by: Irean Hong, MD Authorized by: Irean Hong, MD     Interpretation: normal     ECG rate:  65   ECG rate assessment: normal     Rhythm: sinus rhythm     Ectopy: none     Conduction: normal   Comments:     Patient placed on cardiac monitor to evaluate for arrhythmias      ____________________________________________   INITIAL IMPRESSION / ASSESSMENT AND PLAN / ED COURSE  As part of my medical decision making, I reviewed the following data within the electronic MEDICAL RECORD NUMBER Nursing notes reviewed and incorporated, Labs reviewed, EKG interpreted, Old chart reviewed, Radiograph reviewed and Notes from prior ED visits     54 year old female presenting with chest pain. Differential diagnosis includes, but is not limited to, ACS, aortic dissection, pulmonary embolism, cardiac tamponade,  pneumothorax, pneumonia, pericarditis, myocarditis, GI-related causes including esophagitis/gastritis, and musculoskeletal chest wall pain.    2 sets of negative troponins, unchanged EKG from June of this year.  Chest discomfort completely resolved with a dose of Maalox.  Patient has had cholecystectomy and otherwise does not have abdominal tenderness on examination.  Very low suspicion for PE given patient is not tachycardic, tachypneic nor hypoxic with therapeutic INR 2.9 just 6 days ago.  Strict return precautions given.  Patient verbalizes understanding agrees with plan of care.  ____________________________________________   FINAL CLINICAL IMPRESSION(S) / ED DIAGNOSES  Final diagnoses:  Nonspecific chest pain  Gastroesophageal reflux disease, unspecified whether esophagitis present     ED Discharge Orders    None      *Please note:  Rabecca Birge was evaluated in Emergency Department on 01/14/2020 for the symptoms described in the history of present illness. She was evaluated in the context of the global COVID-19 pandemic, which necessitated consideration that the patient might be at risk for infection with the SARS-CoV-2 virus that causes COVID-19. Institutional protocols and algorithms that pertain to the evaluation of patients at risk for COVID-19 are in a state of rapid change based on information released by regulatory bodies including the CDC and federal and state organizations. These policies and algorithms were followed during the patient's care in the ED.  Some ED evaluations and interventions may be delayed as a result of limited staffing during and the pandemic.*   Note:  This document was prepared using Dragon voice recognition software and may include unintentional dictation errors.   Irean Hong, MD 01/14/20 [phone]

## 2020-01-14 NOTE — Discharge Instructions (Addendum)
Resume Protonix twice daily as directed by your doctor.  Additionally you may take Pepcid and/or Maalox.  Return to the ER for worsening symptoms, persistent vomiting, difficulty breathing or other concerns.

## 2021-08-07 ENCOUNTER — Ambulatory Visit: Payer: Self-pay

## 2022-05-19 ENCOUNTER — Other Ambulatory Visit: Payer: Self-pay | Admitting: Family Medicine

## 2022-05-19 DIAGNOSIS — R1904 Left lower quadrant abdominal swelling, mass and lump: Secondary | ICD-10-CM

## 2022-05-20 ENCOUNTER — Telehealth: Payer: Self-pay

## 2022-05-20 ENCOUNTER — Other Ambulatory Visit: Payer: Self-pay | Admitting: Family Medicine

## 2022-05-20 ENCOUNTER — Ambulatory Visit
Admission: RE | Admit: 2022-05-20 | Discharge: 2022-05-20 | Disposition: A | Discharge status: Home / Self Care | Payer: 59 | Source: Ambulatory Visit | Attending: Family Medicine | Admitting: Family Medicine

## 2022-05-20 DIAGNOSIS — R1904 Left lower quadrant abdominal swelling, mass and lump: Secondary | ICD-10-CM | POA: Insufficient documentation

## 2022-05-20 NOTE — Telephone Encounter (Signed)
Phone call to patient to review instructions for 13 hr prep for CT w/ contrast on 05/24/22 at 1330. Prescription called into [facility]. Pt aware and verbalized understanding of instructions. Prescription: 05/24/22 @ 12:30 AM- '50mg'$  Prednisone 05/24/22 @ 6:30 AM- '50mg'$  Prednisone 05/24/22 @ 12:30 PM - '50mg'$  Prednisone and '50mg'$  Benadryl  Benadryl was also called in as a prescription at the patients request.

## 2022-05-24 ENCOUNTER — Ambulatory Visit
Admission: RE | Admit: 2022-05-24 | Discharge: 2022-05-24 | Disposition: A | Discharge status: Home / Self Care | Payer: Medicare Other | Source: Ambulatory Visit | Attending: Family Medicine | Admitting: Family Medicine

## 2022-05-24 DIAGNOSIS — R1904 Left lower quadrant abdominal swelling, mass and lump: Secondary | ICD-10-CM

## 2022-05-24 MED ORDER — IOPAMIDOL (ISOVUE-300) INJECTION 61%
100.0000 mL | Freq: Once | INTRAVENOUS | Status: AC | PRN
  Administered 2022-05-24: 100 mL via INTRAVENOUS

## 2022-07-05 ENCOUNTER — Emergency Department: Payer: 59

## 2022-07-05 ENCOUNTER — Emergency Department
Admission: EM | Admit: 2022-07-05 | Discharge: 2022-07-05 | Disposition: A | Discharge status: Home / Self Care | Payer: 59 | Attending: Emergency Medicine | Admitting: Emergency Medicine

## 2022-07-05 DIAGNOSIS — R079 Chest pain, unspecified: Secondary | ICD-10-CM | POA: Diagnosis present

## 2022-07-05 LAB — BASIC METABOLIC PANEL
Anion gap: 6 (ref 5–15)
BUN: 15 mg/dL (ref 6–20)
CO2: 25 mmol/L (ref 22–32)
Calcium: 9.1 mg/dL (ref 8.9–10.3)
Chloride: 106 mmol/L (ref 98–111)
Creatinine, Ser: 0.9 mg/dL (ref 0.44–1.00)
GFR, Estimated: >60 mL/min (ref >60)
Glucose, Bld: 194 mg/dL — ABNORMAL HIGH (ref 70–99)
Potassium: 4.4 mmol/L (ref 3.5–5.1)
Sodium: 137 mmol/L (ref 135–145)

## 2022-07-05 LAB — CBC
HCT: 44.3 % (ref 36.0–46.0)
Hemoglobin: 13.4 g/dL (ref 12.0–15.0)
MCH: 26.3 pg (ref 26.0–34.0)
MCHC: 30.2 g/dL (ref 30.0–36.0)
MCV: 87 fL (ref 80.0–100.0)
Platelets: 219 10*3/uL (ref 150–400)
RBC: 5.09 MIL/uL (ref 3.87–5.11)
RDW: 14.6 % (ref 11.5–15.5)
WBC: 7.5 10*3/uL (ref 4.0–10.5)
nRBC: 0 % (ref 0.0–0.2)

## 2022-07-05 LAB — TROPONIN I (HIGH SENSITIVITY): Troponin I (High Sensitivity): 7 ng/L (ref <18)

## 2022-07-05 MED ORDER — OMEPRAZOLE MAGNESIUM 20 MG PO TBEC: 40.0000 mg | DELAYED_RELEASE_TABLET | Freq: Every day | ORAL | 0 refills | Status: AC

## 2022-07-05 MED ORDER — SUCRALFATE 1 G PO TABS: 1.0000 g | ORAL_TABLET | Freq: Four times a day (QID) | ORAL | 0 refills | Status: AC

## 2022-07-05 NOTE — ED Triage Notes (Signed)
Pt sts that she has been having chest pain for the last two days. Pt sts that it is getting worse. Pt originally thought it was acid reflux but it is not going away.

## 2022-07-05 NOTE — ED Provider Notes (Signed)
[facility] Provider Note   Event Date/Time   First MD Initiated Contact with Patient 07/05/22 1525     (approximate) History  Chest Pain  HPI [name] is a 57 y.o. female with a stated past medical history of GERD, aortic valve replacement, intermittent atrial fibrillation, and chronic bilateral lower back pain with bilateral sciatica who presents complaining of chest pain that is dull, aching, pressure in the central and left chest that has been worsening as her reflux has worsened.  Patient states that she had "Mediterranean chicken" that set things off initially and her symptoms have not improved since onset.  Patient states that the only thing that improves her symptoms is eating toast, potatoes, and other bland foods.  Patient states that she is pending a colonoscopy/endoscopy for similar symptoms in the next week. ROS: Patient currently denies any vision changes, tinnitus, difficulty speaking, facial droop, sore throat, shortness of breath, nausea/vomiting/diarrhea, dysuria, or weakness/numbness/paresthesias in any extremity   Physical Exam  Triage Vital Signs: ED Triage Vitals  Enc Vitals Group     BP 07/05/22 1358 (!) 147/79     Pulse Rate 07/05/22 1358 77     Resp 07/05/22 1358 19     Temp 07/05/22 1358 98.3 F (36.8 C)     Temp Source 07/05/22 1358 Oral     SpO2 07/05/22 1358 97 %     Weight 07/05/22 1357 282 lb (127.9 kg)     Height --      Head Circumference --      Peak Flow --      Pain Score 07/05/22 1356 9     Pain Loc --      Pain Edu? --      Excl. in GC? --    Most recent vital signs: Vitals:   07/05/22 1358  BP: (!) 147/79  Pulse: 77  Resp: 19  Temp: 98.3 F (36.8 C)  SpO2: 97%   General: Awake, oriented x4. CV:  Good peripheral perfusion.  Resp:  Normal effort.  Abd:  No distention.  Other:  Middle-aged obese Caucasian female laying in bed in no acute distress ED Results / Procedures / Treatments  Labs (all labs  ordered are listed, but only abnormal results are displayed) Labs Reviewed  BASIC METABOLIC PANEL - Abnormal; Notable for the following components:      Result Value   Glucose, Bld 194 (*)    All other components within normal limits  CBC  TROPONIN I (HIGH SENSITIVITY)  TROPONIN I (HIGH SENSITIVITY)   EKG ED ECG REPORT I, Merwyn Katos, the attending physician, personally viewed and interpreted this ECG. Date: 07/05/2022 EKG Time: 1400 Rate: 78 Rhythm: normal sinus rhythm QRS Axis: normal Intervals: Left bundle branch block ST/T Wave abnormalities: normal Narrative Interpretation: Normal sinus rhythm with left bundle branch block no evidence of acute ischemia RADIOLOGY ED MD interpretation: 2 view chest x-ray interpreted by me shows no evidence of acute abnormalities including no pneumonia, pneumothorax, or widened mediastinum -Agree with radiology assessment Official radiology report(s): DG Chest 2 View  Result Date: 07/05/2022 CLINICAL DATA:  Chest pain EXAM: CHEST - 2 VIEW COMPARISON:  None Available. FINDINGS: Sternal wires overlie normal cardiac silhouette. Effusion, infiltrate, or pneumothorax. No acute osseous abnormality. IMPRESSION: No acute cardiopulmonary process. Electronically Signed   By: Genevive Bi M.D.   On: 07/05/2022 14:32   PROCEDURES: Critical Care performed: No .1-3 Lead EKG Interpretation  Performed by: Merwyn Katos, MD  Authorized by: Merwyn Katos, MD     Interpretation: normal     ECG rate:  71   ECG rate assessment: normal     Rhythm: sinus rhythm     Ectopy: none     Conduction: normal    MEDICATIONS ORDERED IN ED: Medications - No data to display IMPRESSION / MDM / ASSESSMENT AND PLAN / ED COURSE  I reviewed the triage vital signs and the nursing notes.                             The patient is on the cardiac monitor to evaluate for evidence of arrhythmia and/or significant heart rate changes. Patient's presentation is most  consistent with acute presentation with potential threat to life or bodily function. Workup: ECG, CXR, CBC, BMP, Troponin Findings: ECG: No overt evidence of STEMI. No evidence of Brugadas sign, delta wave, epsilon wave, significantly prolonged QTc, or malignant arrhythmia HS Troponin: Negative x1 Other Labs unremarkable for emergent problems. CXR: Without PTX, PNA, or widened mediastinum Last Stress Test:  2018 Last Heart Catheterization:  2018 HEART Score: 4  Given History, Exam, and Workup I have low suspicion for ACS, Pneumothorax, Pneumonia, Pulmonary Embolus, Tamponade, Aortic Dissection or other emergent problem as a cause for this presentation.   Reassesment: Prior to discharge patients pain was controlled and they were well appearing.  Disposition:  Discharge. Strict return precautions discussed with patient with full understanding. Advised patient to follow up promptly with primary care provider   FINAL CLINICAL IMPRESSION(S) / ED DIAGNOSES   Final diagnoses:  Chest pain, unspecified type   Rx / DC Orders   ED Discharge Orders          Ordered    sucralfate (CARAFATE) 1 g tablet  4 times daily        07/05/22 1605    omeprazole (PRILOSEC OTC) 20 MG tablet  Daily        07/05/22 1605           Note:  This document was prepared using Dragon voice recognition software and may include unintentional dictation errors.   Merwyn Katos, MD 07/05/22 [phone]

## 2023-01-11 ENCOUNTER — Ambulatory Visit: Payer: 59

## 2023-05-17 ENCOUNTER — Ambulatory Visit
Admission: RE | Admit: 2023-05-17 | Discharge: 2023-05-17 | Disposition: A | Discharge status: Home / Self Care | Payer: 59 | Attending: Family Medicine | Admitting: Family Medicine

## 2023-05-17 ENCOUNTER — Other Ambulatory Visit: Payer: Self-pay | Admitting: Family Medicine

## 2023-05-17 ENCOUNTER — Ambulatory Visit
Admission: RE | Admit: 2023-05-17 | Discharge: 2023-05-17 | Disposition: A | Discharge status: Home / Self Care | Payer: 59 | Source: Ambulatory Visit | Attending: Family Medicine | Admitting: Family Medicine

## 2023-05-17 DIAGNOSIS — R0789 Other chest pain: Secondary | ICD-10-CM

## 2023-05-17 DIAGNOSIS — R051 Acute cough: Secondary | ICD-10-CM
